# Patient Record
Sex: Female | Born: 1945
Health system: Southern US, Community
[De-identification: ages and names within clinical notes are randomized; demographics above are authoritative.]

## PROBLEM LIST (undated history)

## (undated) DIAGNOSIS — I10 Essential (primary) hypertension: Secondary | ICD-10-CM

## (undated) DIAGNOSIS — F32A Depression, unspecified: Secondary | ICD-10-CM

## (undated) DIAGNOSIS — I4819 Other persistent atrial fibrillation: Secondary | ICD-10-CM

## (undated) DIAGNOSIS — N189 Chronic kidney disease, unspecified: Secondary | ICD-10-CM

## (undated) DIAGNOSIS — F329 Major depressive disorder, single episode, unspecified: Secondary | ICD-10-CM

## (undated) DIAGNOSIS — C4491 Basal cell carcinoma of skin, unspecified: Secondary | ICD-10-CM

## (undated) DIAGNOSIS — K635 Polyp of colon: Secondary | ICD-10-CM

## (undated) DIAGNOSIS — E785 Hyperlipidemia, unspecified: Secondary | ICD-10-CM

## (undated) DIAGNOSIS — Z87898 Personal history of other specified conditions: Secondary | ICD-10-CM

## (undated) DIAGNOSIS — M1990 Unspecified osteoarthritis, unspecified site: Secondary | ICD-10-CM

## (undated) HISTORY — DX: Basal cell carcinoma of skin, unspecified: C44.91

## (undated) HISTORY — DX: Chronic kidney disease, unspecified: N18.9

## (undated) HISTORY — DX: Depression, unspecified: F32.A

## (undated) HISTORY — DX: Major depressive disorder, single episode, unspecified: F32.9

## (undated) HISTORY — DX: Other persistent atrial fibrillation: I48.19

## (undated) HISTORY — DX: Polyp of colon: K63.5

## (undated) HISTORY — DX: Essential (primary) hypertension: I10

## (undated) HISTORY — DX: Hyperlipidemia, unspecified: E78.5

## (undated) HISTORY — PX: CARDIAC CATHETERIZATION: SHX172

## (undated) HISTORY — PX: DIAGNOSTIC LAPAROSCOPY: SUR761

## (undated) HISTORY — PX: DILATION AND CURETTAGE OF UTERUS: SHX78

## (undated) HISTORY — PX: COLONOSCOPY: SHX174

## (undated) HISTORY — PX: CATARACT EXTRACTION, BILATERAL: SHX1313

---

## 1998-08-21 ENCOUNTER — Other Ambulatory Visit: Admission: RE | Admit: 1998-08-21 | Discharge: 1998-08-21 | Payer: Self-pay | Admitting: Obstetrics and Gynecology

## 1999-07-02 ENCOUNTER — Other Ambulatory Visit: Admission: RE | Admit: 1999-07-02 | Discharge: 1999-07-02 | Payer: Self-pay | Admitting: Obstetrics and Gynecology

## 2000-05-25 ENCOUNTER — Encounter: Payer: Self-pay | Admitting: Obstetrics and Gynecology

## 2000-05-25 ENCOUNTER — Encounter: Admission: RE | Admit: 2000-05-25 | Discharge: 2000-05-25 | Payer: Self-pay | Admitting: Obstetrics and Gynecology

## 2000-07-14 ENCOUNTER — Other Ambulatory Visit: Admission: RE | Admit: 2000-07-14 | Discharge: 2000-07-14 | Payer: Self-pay | Admitting: Obstetrics and Gynecology

## 2001-07-06 ENCOUNTER — Encounter: Admission: RE | Admit: 2001-07-06 | Discharge: 2001-07-06 | Payer: Self-pay | Admitting: Obstetrics and Gynecology

## 2001-07-06 ENCOUNTER — Encounter: Payer: Self-pay | Admitting: Obstetrics and Gynecology

## 2001-08-14 ENCOUNTER — Other Ambulatory Visit: Admission: RE | Admit: 2001-08-14 | Discharge: 2001-08-14 | Payer: Self-pay | Admitting: Obstetrics and Gynecology

## 2001-11-10 ENCOUNTER — Encounter (INDEPENDENT_AMBULATORY_CARE_PROVIDER_SITE_OTHER): Payer: Self-pay

## 2001-11-10 ENCOUNTER — Ambulatory Visit (HOSPITAL_COMMUNITY): Admission: RE | Admit: 2001-11-10 | Discharge: 2001-11-10 | Payer: Self-pay | Admitting: Obstetrics and Gynecology

## 2002-11-16 ENCOUNTER — Encounter: Payer: Self-pay | Admitting: Obstetrics and Gynecology

## 2002-11-16 ENCOUNTER — Encounter: Admission: RE | Admit: 2002-11-16 | Discharge: 2002-11-16 | Payer: Self-pay | Admitting: Obstetrics and Gynecology

## 2003-01-01 ENCOUNTER — Other Ambulatory Visit: Admission: RE | Admit: 2003-01-01 | Discharge: 2003-01-01 | Payer: Self-pay | Admitting: Obstetrics and Gynecology

## 2003-03-26 ENCOUNTER — Encounter: Payer: Self-pay | Admitting: Obstetrics and Gynecology

## 2003-03-26 ENCOUNTER — Encounter: Admission: RE | Admit: 2003-03-26 | Discharge: 2003-03-26 | Payer: Self-pay | Admitting: Obstetrics and Gynecology

## 2005-03-16 ENCOUNTER — Encounter: Admission: RE | Admit: 2005-03-16 | Discharge: 2005-03-16 | Payer: Self-pay | Admitting: Internal Medicine

## 2006-05-05 ENCOUNTER — Encounter: Admission: RE | Admit: 2006-05-05 | Discharge: 2006-05-05 | Payer: Self-pay | Admitting: Obstetrics and Gynecology

## 2007-01-25 ENCOUNTER — Ambulatory Visit (HOSPITAL_COMMUNITY): Admission: RE | Admit: 2007-01-25 | Discharge: 2007-01-25 | Payer: Self-pay | Admitting: Family Medicine

## 2007-01-25 ENCOUNTER — Ambulatory Visit: Payer: Self-pay | Admitting: Vascular Surgery

## 2007-01-25 ENCOUNTER — Encounter: Payer: Self-pay | Admitting: Vascular Surgery

## 2007-07-05 ENCOUNTER — Encounter: Admission: RE | Admit: 2007-07-05 | Discharge: 2007-07-05 | Payer: Self-pay | Admitting: Obstetrics and Gynecology

## 2008-07-17 ENCOUNTER — Encounter: Admission: RE | Admit: 2008-07-17 | Discharge: 2008-07-17 | Payer: Self-pay | Admitting: Obstetrics and Gynecology

## 2010-03-09 ENCOUNTER — Encounter: Admission: RE | Admit: 2010-03-09 | Discharge: 2010-03-09 | Payer: Self-pay | Admitting: Obstetrics and Gynecology

## 2010-08-06 ENCOUNTER — Ambulatory Visit: Payer: Self-pay | Admitting: Vascular Surgery

## 2011-02-19 NOTE — Procedures (Signed)
CAROTID DUPLEX EXAM   INDICATION:  Carotid artery disease   HISTORY:  Diabetes:  No  Cardiac:  No  Hypertension:  Yes  Smoking:  No  Previous Surgery:  No  CV History:  Amaurosis Fugax  Yes  Paresthesias Yes No, Hemiparesis Yes No                                       RIGHT             LEFT  Brachial systolic pressure:         160               162  Brachial Doppler waveforms:         WNL               WNL  Vertebral direction of flow:        Antegrade         Antegrade  DUPLEX VELOCITIES (cm/sec)  CCA peak systolic                   83                95  ECA peak systolic                   137               85  ICA peak systolic                   94                177  ICA end diastolic                   29                33  PLAQUE MORPHOLOGY:                  Heterogeneous     Heterogeneous  PLAQUE AMOUNT:                      Mild              Mild to moderate  PLAQUE LOCATION:                    Proximal ICA      Proximal ICA   IMPRESSION:  Bilateral intimal thickening within the common carotid  arteries.  Left-sided internal carotid artery stenosis is 40% to 59%.  Right-sided internal carotid artery is 1% to 39%.   ___________________________________________  Di Kindle. Edilia Bo, M.D.   OD/MEDQ  D:  08/10/2010  T:  08/10/2010  Job:  [id number]

## 2011-02-19 NOTE — Op Note (Signed)
[facility]  Patient:    Melissa Clements, Melissa Clements Visit Number: [id number] MRN: [id number]          Service Type: DSU Location: [facility] Attending Physician:  Maxie Better Dictated by:   Sheria Lang. Cherly Hensen, M.D. Proc. Date: 11/10/01 Admit Date:  11/10/2001                             Operative Report  PREOPERATIVE DIAGNOSES:       1. Postmenopausal bleeding.                               2. Endometrial mass.  POSTOPERATIVE DIAGNOSES:      1. Postmenopausal bleeding.                               2. Endometrial polyp.  OPERATION:                    Hysteroscopic removal of endometrial polyp,                               dilatation and curettage.  SURGEON:                      Sheronette A. Cherly Hensen, M.D.  ANESTHESIA:                   MAC and paracervical block.  INDICATIONS:                  This is a 65 year old postmenopausal female, not on hormone therapy with postmenopausal bleeding, who on evaluation was found to have an endometrial mass on sonohistogram.  The patient now presents for further evaluation and management.  She had a laminaria placed in the office on November 09, 2001, to facilitate the surgery as the patient has atrophic vaginitis, and has not been sexually active for quite some time.  Risks and benefits of the procedure had been explained to the patient, consent was signed, and the patient was transferred to the operating room.  DESCRIPTION OF PROCEDURE:     Under adequate monitored anesthesia, the patient was placed in the dorsolithotomy position.  She was sterilely prepped and draped in the usual fashion.  The bladder was catheterized for a moderate amount of urine.  A bivalve speculum was placed in the vagina.  The vagina was again prepped with some Betadine solution.  A single tooth tenaculum was placed on the anterior lip of the cervix and the laminaria was removed. Twenty cubic centimeters of 1% Nesacaine was injected  paracervically at 3 and 9 oclock.  The hysteroscope which was ________ was introduced into the uterine cavity without incident with the finding of a large polypoid mass arising from the left lateral wall.  The endometrium was otherwise erythematous diffusely, and both tubal ostia could be seen.  Using the single loupe, the endometrial polyp was resected in its entirety. Then, the cervical canal was inspected, and no lesions noted.  The resectoscope was removed.  The cavity was then curetted.  Scant tissue was obtained.  The procedure was terminated by removing all instruments from the vagina.  Specimen labeled endometrial curetting and endometrial polyp was sent to pathology.  Estimated blood loss was minimal.  Fluid  deficit was 90 cc. Complications none.  The patient tolerated the procedure well and was transferred to the recovery room in stable condition. Dictated by:   Sheria Lang. Cherly Hensen, M.D. Attending Physician:  Maxie Better DD:  11/10/01 TD:  11/11/01 Job: [id number]

## 2011-03-23 ENCOUNTER — Other Ambulatory Visit: Payer: Self-pay | Admitting: Obstetrics and Gynecology

## 2011-03-23 DIAGNOSIS — Z1231 Encounter for screening mammogram for malignant neoplasm of breast: Secondary | ICD-10-CM

## 2011-04-01 ENCOUNTER — Ambulatory Visit
Admission: RE | Admit: 2011-04-01 | Discharge: 2011-04-01 | Disposition: A | Discharge status: Home / Self Care | Payer: Medicare Other | Source: Ambulatory Visit | Attending: Obstetrics and Gynecology | Admitting: Obstetrics and Gynecology

## 2011-04-01 DIAGNOSIS — Z1231 Encounter for screening mammogram for malignant neoplasm of breast: Secondary | ICD-10-CM

## 2012-05-15 ENCOUNTER — Other Ambulatory Visit: Payer: Self-pay | Admitting: Obstetrics and Gynecology

## 2012-05-15 DIAGNOSIS — Z1231 Encounter for screening mammogram for malignant neoplasm of breast: Secondary | ICD-10-CM

## 2012-06-01 ENCOUNTER — Ambulatory Visit
Admission: RE | Admit: 2012-06-01 | Discharge: 2012-06-01 | Disposition: A | Discharge status: Home / Self Care | Payer: Medicare Other | Source: Ambulatory Visit | Attending: Obstetrics and Gynecology | Admitting: Obstetrics and Gynecology

## 2012-06-01 DIAGNOSIS — Z1231 Encounter for screening mammogram for malignant neoplasm of breast: Secondary | ICD-10-CM

## 2013-08-07 ENCOUNTER — Other Ambulatory Visit: Payer: Self-pay

## 2013-08-07 DIAGNOSIS — Z1231 Encounter for screening mammogram for malignant neoplasm of breast: Secondary | ICD-10-CM

## 2013-09-11 ENCOUNTER — Ambulatory Visit
Admission: RE | Admit: 2013-09-11 | Discharge: 2013-09-11 | Disposition: A | Discharge status: Home / Self Care | Payer: Medicare Other | Source: Ambulatory Visit

## 2013-09-11 DIAGNOSIS — Z1231 Encounter for screening mammogram for malignant neoplasm of breast: Secondary | ICD-10-CM

## 2014-10-08 ENCOUNTER — Other Ambulatory Visit: Payer: Self-pay

## 2014-10-08 DIAGNOSIS — Z1231 Encounter for screening mammogram for malignant neoplasm of breast: Secondary | ICD-10-CM

## 2014-10-16 ENCOUNTER — Ambulatory Visit
Admission: RE | Admit: 2014-10-16 | Discharge: 2014-10-16 | Disposition: A | Discharge status: Home / Self Care | Payer: Medicare Other | Source: Ambulatory Visit

## 2014-10-16 DIAGNOSIS — Z1231 Encounter for screening mammogram for malignant neoplasm of breast: Secondary | ICD-10-CM

## 2014-11-21 ENCOUNTER — Other Ambulatory Visit: Payer: Self-pay | Admitting: Family Medicine

## 2014-11-21 DIAGNOSIS — R42 Dizziness and giddiness: Secondary | ICD-10-CM

## 2014-11-21 DIAGNOSIS — R0789 Other chest pain: Secondary | ICD-10-CM

## 2014-11-28 ENCOUNTER — Ambulatory Visit
Admission: RE | Admit: 2014-11-28 | Discharge: 2014-11-28 | Disposition: A | Discharge status: Home / Self Care | Payer: Medicare Other | Source: Ambulatory Visit | Attending: Family Medicine | Admitting: Family Medicine

## 2014-11-28 ENCOUNTER — Other Ambulatory Visit: Payer: Self-pay | Admitting: Family Medicine

## 2014-11-28 DIAGNOSIS — M25552 Pain in left hip: Secondary | ICD-10-CM

## 2014-11-28 DIAGNOSIS — M545 Low back pain: Secondary | ICD-10-CM

## 2014-11-28 DIAGNOSIS — R0789 Other chest pain: Secondary | ICD-10-CM

## 2014-11-28 DIAGNOSIS — R42 Dizziness and giddiness: Secondary | ICD-10-CM

## 2015-01-10 ENCOUNTER — Other Ambulatory Visit: Payer: Self-pay | Admitting: Family Medicine

## 2015-01-10 ENCOUNTER — Ambulatory Visit
Admission: RE | Admit: 2015-01-10 | Discharge: 2015-01-10 | Disposition: A | Discharge status: Home / Self Care | Payer: Medicare Other | Source: Ambulatory Visit | Attending: Family Medicine | Admitting: Family Medicine

## 2015-01-10 DIAGNOSIS — M25531 Pain in right wrist: Secondary | ICD-10-CM

## 2015-01-10 DIAGNOSIS — R531 Weakness: Secondary | ICD-10-CM

## 2015-01-23 ENCOUNTER — Encounter: Payer: Self-pay | Admitting: Cardiology

## 2015-01-23 ENCOUNTER — Ambulatory Visit (INDEPENDENT_AMBULATORY_CARE_PROVIDER_SITE_OTHER): Payer: Medicare Other | Admitting: Cardiology

## 2015-01-23 VITALS — BP 110/70 | HR 57 | Ht 64.75 in | Wt 233.8 lb

## 2015-01-23 DIAGNOSIS — R42 Dizziness and giddiness: Secondary | ICD-10-CM

## 2015-01-23 DIAGNOSIS — R072 Precordial pain: Secondary | ICD-10-CM

## 2015-01-23 DIAGNOSIS — R002 Palpitations: Secondary | ICD-10-CM | POA: Insufficient documentation

## 2015-01-23 DIAGNOSIS — R079 Chest pain, unspecified: Secondary | ICD-10-CM | POA: Insufficient documentation

## 2015-01-23 DIAGNOSIS — K635 Polyp of colon: Secondary | ICD-10-CM | POA: Insufficient documentation

## 2015-01-23 DIAGNOSIS — I10 Essential (primary) hypertension: Secondary | ICD-10-CM | POA: Insufficient documentation

## 2015-01-23 DIAGNOSIS — C4491 Basal cell carcinoma of skin, unspecified: Secondary | ICD-10-CM | POA: Insufficient documentation

## 2015-01-23 HISTORY — DX: Precordial pain: R07.2

## 2015-01-23 HISTORY — DX: Palpitations: R00.2

## 2015-01-23 HISTORY — DX: Dizziness and giddiness: R42

## 2015-01-23 HISTORY — DX: Essential (primary) hypertension: I10

## 2015-01-23 NOTE — Progress Notes (Signed)
Cardiology Office Note   Date:  01/23/2015   ID:  Melissa Clements, DOB Feb 21, 1946, MRN [id number]  PCP:  [name], MD    Chief Complaint  Patient presents with  . Chest Pain  . Palpitations      History of Present Illness: Melissa Clements is a 69 y.o. female who presents for evaluation of chest pain.  The patient has a history of HTN, CKD, dyslipidemia and HTN.  She has never smoked.  She started having CP back in February associated with 2 episodes of  lightheadedness while up in the kitchen and occurred in the same week.    She would get so dizzy she would feel like she was going to pass out.  She did notice some palpitations at the same time.  After one of the episodes of dizziness she starting having burning  in her chest that did not radiate.  She took [place] which relieved the discomfort.  She has had other episodes of chest pressure.  She has a lot of family stress and has been feeling tired.  EKG done at PCP office was normal.  She has not had any further dizzy spells, palpitations or chest pain since February.    Past Medical History  Diagnosis Date  . Hypertension   . Chronic kidney disease     stage III  . Hyperlipidemia   . Depression   . Basal cell carcinoma   . Colon polyp     Past Surgical History  Procedure Laterality Date  . None       Current Outpatient Prescriptions  Medication Sig Dispense Refill  . amphetamine-dextroamphetamine (ADDERALL) 15 MG tablet Take 15 mg by mouth 2 (two) times daily.    Marland Kitchen aspirin 81 MG tablet Take 81 mg by mouth daily.    Marland Kitchen lisinopril (PRINIVIL,ZESTRIL) 20 MG tablet Take 20 mg by mouth daily.    . metoprolol succinate (TOPROL-XL) 50 MG 24 hr tablet Take 50 mg by mouth daily.    . simvastatin (ZOCOR) 40 MG tablet Take 40 mg by mouth daily.     No current facility-administered medications for this visit.    Allergies:   Review of patient's allergies indicates no known allergies.    Social History:  The patient   reports that she has never smoked. She does not have any smokeless tobacco history on file. She reports that she does not drink alcohol or use illicit drugs.   Family History:  The patient's family history includes Alzheimer's disease in her brother and mother; Diabetes in her mother; Heart attack (age of onset: 31) in her father; Heart disease in her father.    ROS:  Please see the history of present illness.   Otherwise, review of systems are positive for none.   All other systems are reviewed and negative.    PHYSICAL EXAM: VS:  BP 110/70 mmHg  Pulse 57  Ht 5' 4.75" (1.645 m)  Wt 233 lb 12.8 oz (106.051 kg)  BMI 39.19 kg/m2 , BMI Body mass index is 39.19 kg/(m^2). GEN: Well nourished, well developed, in no acute distress HEENT: normal Neck: no JVD, carotid bruits, or masses Cardiac: RRR; no murmurs, rubs, or gallops,no edema  Respiratory:  clear to auscultation bilaterally, normal work of breathing GI: soft, nontender, nondistended, + BS MS: no deformity or atrophy Skin: warm and dry, no rash Neuro:  Strength and sensation are intact Psych: euthymic mood, full affect   EKG:  EKG is ordered today.  The ekg ordered today demonstrates sinus bradycardia   Recent Labs: No results found for requested labs within last 365 days.    Lipid Panel No results found for: CHOL, TRIG, HDL, CHOLHDL, VLDL, LDLCALC, LDLDIRECT    Wt Readings from Last 3 Encounters:  01/23/15 233 lb 12.8 oz (106.051 kg)       ASSESSMENT AND PLAN:  1.  Atypical chest pain with no ischemia on EKG. Her CRF include HTN, dyslipidemia and family history of CAD at an early age.  I will get a Lexiscan myoview to assess for ischemia.   2.  HTN - well controlled 3.  Dyslipidemia 4.  Dizziness with palpitations.  Her EKG did not show any arrhythmias but I do hear frequent ectopy on exam.  I will get a Holter monitor to assess.   Current medicines are reviewed at length with the patient today.  The patient does  not have concerns regarding medicines.  The following changes have been made:  no change  Labs/ tests ordered today include: see above assessment and plan  Orders Placed This Encounter  Procedures  . Myocardial Perfusion Imaging  . EKG 12-Lead  . Holter monitor - 24 hour     Disposition:   FU with me PRN pending results of studies   Signed, Sueanne Margarita, MD  01/23/2015 4:14 PM    [facility] [address], [place], [place]  [address] Phone: [phone]; Fax: [phone]

## 2015-01-23 NOTE — Patient Instructions (Signed)
Medication Instructions:  None  Labwork: None  Testing/Procedures: Your physician has requested that you have a lexiscan myoview. For further information please visit [email or web address]. Please follow instruction sheet, as given.  Your physician has recommended that you wear a 24 holter monitor. Holter monitors are medical devices that record the heart's electrical activity. Doctors most often use these monitors to diagnose arrhythmias. Arrhythmias are problems with the speed or rhythm of the heartbeat. The monitor is a small, portable device. You can wear one while you do your normal daily activities. This is usually used to diagnose what is causing palpitations/syncope (passing out).   Follow-Up: Your physician recommends that you schedule a follow-up appointment as needed with Dr. Radford Pax.  Any Other Special Instructions Will Be Listed Below (If Applicable).

## 2015-02-03 ENCOUNTER — Encounter: Payer: Self-pay | Admitting: *Deleted

## 2015-02-03 ENCOUNTER — Encounter (INDEPENDENT_AMBULATORY_CARE_PROVIDER_SITE_OTHER): Payer: Medicare Other

## 2015-02-03 DIAGNOSIS — R42 Dizziness and giddiness: Secondary | ICD-10-CM

## 2015-02-03 DIAGNOSIS — R002 Palpitations: Secondary | ICD-10-CM

## 2015-02-03 NOTE — Progress Notes (Signed)
Patient ID: Melissa Clements, female   DOB: 04-22-1946, 69 y.o.   MRN: [id number] [facility] 24 hour holter monitor applied to patient.

## 2015-02-05 ENCOUNTER — Telehealth (HOSPITAL_COMMUNITY): Payer: Self-pay

## 2015-02-05 ENCOUNTER — Telehealth (HOSPITAL_COMMUNITY): Payer: Self-pay | Admitting: Radiology

## 2015-02-05 NOTE — Telephone Encounter (Signed)
Patient given detailed instructions per Myocardial Perfusion Study Information Sheet for test on 02-06-2015 at 7:45am. Patient verbalized understanding per Earl Many, CNMT/[place] Oletta Lamas, RN.

## 2015-02-05 NOTE — Telephone Encounter (Signed)
Left message on voicemail in reference to upcoming appointment scheduled for 02-06-2015. Phone number given for a call back so details instructions can be given. Oletta Lamas, Orren Pietsch A

## 2015-02-06 ENCOUNTER — Ambulatory Visit (HOSPITAL_COMMUNITY): Payer: Medicare Other | Attending: Internal Medicine

## 2015-02-06 ENCOUNTER — Telehealth: Payer: Self-pay

## 2015-02-06 DIAGNOSIS — R002 Palpitations: Secondary | ICD-10-CM | POA: Diagnosis not present

## 2015-02-06 DIAGNOSIS — I10 Essential (primary) hypertension: Secondary | ICD-10-CM | POA: Insufficient documentation

## 2015-02-06 DIAGNOSIS — R42 Dizziness and giddiness: Secondary | ICD-10-CM | POA: Insufficient documentation

## 2015-02-06 DIAGNOSIS — R079 Chest pain, unspecified: Secondary | ICD-10-CM | POA: Diagnosis present

## 2015-02-06 LAB — MYOCARDIAL PERFUSION IMAGING
CHL CUP NUCLEAR SDS: 1
CHL CUP NUCLEAR SRS: 9
CHL CUP NUCLEAR SSS: 10
CHL CUP RESTING HR STRESS: 59 {beats}/min
CSEPPHR: 74 {beats}/min
LHR: 0.25
LV sys vol: 15 mL
LVDIAVOL: 68 mL
NUC STRESS EF: 78 %
NUC STRESS TID: 0.85

## 2015-02-06 MED ORDER — REGADENOSON 0.4 MG/5ML IV SOLN
0.4000 mg | Freq: Once | INTRAVENOUS | Status: AC
  Administered 2015-02-06: 0.4 mg via INTRAVENOUS

## 2015-02-06 MED ORDER — TECHNETIUM TC 99M SESTAMIBI GENERIC - CARDIOLITE
11.0000 | Freq: Once | INTRAVENOUS | Status: AC | PRN
  Administered 2015-02-06: 11 via INTRAVENOUS

## 2015-02-06 MED ORDER — TECHNETIUM TC 99M SESTAMIBI GENERIC - CARDIOLITE
33.0000 | Freq: Once | INTRAVENOUS | Status: AC | PRN
  Administered 2015-02-06: 33 via INTRAVENOUS

## 2015-02-06 NOTE — Telephone Encounter (Signed)
Informed patient of results and verbal understanding expressed.  

## 2015-02-06 NOTE — Telephone Encounter (Signed)
-----   Message from Sueanne Margarita, MD sent at 02/05/2015  9:18 PM EDT ----- Please let patient know that heart monitor showed NSR with occasional extra heart beats from the top and bottom of heart which are benign

## 2016-04-30 IMAGING — NM NM MYOCAR MULTI W/ SPECT
3 series · 18 of 18 positions shown · non-contrast
Comparison: none

[Series 1: rest · 6.51mm/px · 6 of 64 frames shown]
[frame 6/64]
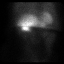
[frame 16/64]
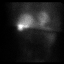
[frame 27/64]
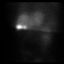
[frame 38/64]
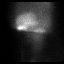
[frame 48/64]
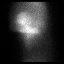
[frame 59/64]
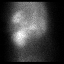

[Series 2: stress · 6.51mm/px · 6 of 63 frames shown (1 of 2)]
[frame 6/63]
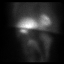
[frame 16/63]
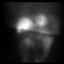
[frame 27/63]
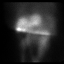
[frame 37/63]
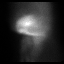
[frame 48/63]
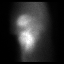
[frame 58/63]
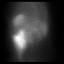

[Series 2: stress · 6.51mm/px · 6 of 512 frames shown (2 of 2)]
[frame 43/512]
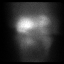
[frame 128/512]
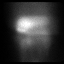
[frame 214/512]
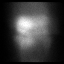
[frame 299/512]
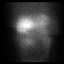
[frame 384/512]
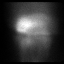
[frame 470/512]
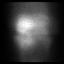

[18 of 18 positions shown; findings below may reference images not displayed]

Canned report from images found in remote index.

Refer to host system for actual result text.

## 2016-12-14 DIAGNOSIS — E559 Vitamin D deficiency, unspecified: Secondary | ICD-10-CM | POA: Diagnosis not present

## 2016-12-14 DIAGNOSIS — I10 Essential (primary) hypertension: Secondary | ICD-10-CM | POA: Diagnosis not present

## 2016-12-14 DIAGNOSIS — N183 Chronic kidney disease, stage 3 (moderate): Secondary | ICD-10-CM | POA: Diagnosis not present

## 2016-12-14 DIAGNOSIS — E782 Mixed hyperlipidemia: Secondary | ICD-10-CM | POA: Diagnosis not present

## 2016-12-14 DIAGNOSIS — F3341 Major depressive disorder, recurrent, in partial remission: Secondary | ICD-10-CM | POA: Diagnosis not present

## 2016-12-20 DIAGNOSIS — I10 Essential (primary) hypertension: Secondary | ICD-10-CM | POA: Diagnosis not present

## 2016-12-20 DIAGNOSIS — E559 Vitamin D deficiency, unspecified: Secondary | ICD-10-CM | POA: Diagnosis not present

## 2016-12-20 DIAGNOSIS — M85852 Other specified disorders of bone density and structure, left thigh: Secondary | ICD-10-CM | POA: Diagnosis not present

## 2016-12-20 DIAGNOSIS — E669 Obesity, unspecified: Secondary | ICD-10-CM | POA: Diagnosis not present

## 2016-12-20 DIAGNOSIS — Z0000 Encounter for general adult medical examination without abnormal findings: Secondary | ICD-10-CM | POA: Diagnosis not present

## 2016-12-20 DIAGNOSIS — F3341 Major depressive disorder, recurrent, in partial remission: Secondary | ICD-10-CM | POA: Diagnosis not present

## 2016-12-20 DIAGNOSIS — N183 Chronic kidney disease, stage 3 (moderate): Secondary | ICD-10-CM | POA: Diagnosis not present

## 2016-12-20 DIAGNOSIS — Z6839 Body mass index (BMI) 39.0-39.9, adult: Secondary | ICD-10-CM | POA: Diagnosis not present

## 2016-12-20 DIAGNOSIS — E782 Mixed hyperlipidemia: Secondary | ICD-10-CM | POA: Diagnosis not present

## 2017-01-11 ENCOUNTER — Other Ambulatory Visit: Payer: Self-pay | Admitting: Obstetrics and Gynecology

## 2017-01-11 DIAGNOSIS — Z1231 Encounter for screening mammogram for malignant neoplasm of breast: Secondary | ICD-10-CM

## 2017-01-18 DIAGNOSIS — H43812 Vitreous degeneration, left eye: Secondary | ICD-10-CM | POA: Diagnosis not present

## 2017-01-18 DIAGNOSIS — H43393 Other vitreous opacities, bilateral: Secondary | ICD-10-CM | POA: Diagnosis not present

## 2017-01-27 DIAGNOSIS — M8588 Other specified disorders of bone density and structure, other site: Secondary | ICD-10-CM | POA: Diagnosis not present

## 2017-01-31 ENCOUNTER — Ambulatory Visit
Admission: RE | Admit: 2017-01-31 | Discharge: 2017-01-31 | Disposition: A | Discharge status: Home / Self Care | Payer: PPO | Source: Ambulatory Visit | Attending: Obstetrics and Gynecology | Admitting: Obstetrics and Gynecology

## 2017-01-31 DIAGNOSIS — Z1231 Encounter for screening mammogram for malignant neoplasm of breast: Secondary | ICD-10-CM

## 2017-02-15 DIAGNOSIS — Z1211 Encounter for screening for malignant neoplasm of colon: Secondary | ICD-10-CM | POA: Diagnosis not present

## 2017-02-16 DIAGNOSIS — H43393 Other vitreous opacities, bilateral: Secondary | ICD-10-CM | POA: Diagnosis not present

## 2017-02-16 DIAGNOSIS — H43812 Vitreous degeneration, left eye: Secondary | ICD-10-CM | POA: Diagnosis not present

## 2017-02-17 DIAGNOSIS — M1611 Unilateral primary osteoarthritis, right hip: Secondary | ICD-10-CM | POA: Diagnosis not present

## 2017-02-17 DIAGNOSIS — R87610 Atypical squamous cells of undetermined significance on cytologic smear of cervix (ASC-US): Secondary | ICD-10-CM | POA: Diagnosis not present

## 2017-02-17 DIAGNOSIS — Z01419 Encounter for gynecological examination (general) (routine) without abnormal findings: Secondary | ICD-10-CM | POA: Diagnosis not present

## 2017-03-03 DIAGNOSIS — M1611 Unilateral primary osteoarthritis, right hip: Secondary | ICD-10-CM | POA: Diagnosis not present

## 2017-03-10 DIAGNOSIS — H43812 Vitreous degeneration, left eye: Secondary | ICD-10-CM | POA: Diagnosis not present

## 2017-03-10 DIAGNOSIS — H40013 Open angle with borderline findings, low risk, bilateral: Secondary | ICD-10-CM | POA: Diagnosis not present

## 2017-03-10 DIAGNOSIS — H43393 Other vitreous opacities, bilateral: Secondary | ICD-10-CM | POA: Diagnosis not present

## 2017-03-10 DIAGNOSIS — H524 Presbyopia: Secondary | ICD-10-CM | POA: Diagnosis not present

## 2017-03-10 DIAGNOSIS — H5213 Myopia, bilateral: Secondary | ICD-10-CM | POA: Diagnosis not present

## 2017-03-10 DIAGNOSIS — Z961 Presence of intraocular lens: Secondary | ICD-10-CM | POA: Diagnosis not present

## 2017-03-31 DIAGNOSIS — M25551 Pain in right hip: Secondary | ICD-10-CM | POA: Diagnosis not present

## 2017-03-31 DIAGNOSIS — M1611 Unilateral primary osteoarthritis, right hip: Secondary | ICD-10-CM | POA: Diagnosis not present

## 2017-06-21 DIAGNOSIS — E669 Obesity, unspecified: Secondary | ICD-10-CM | POA: Diagnosis not present

## 2017-06-21 DIAGNOSIS — N3946 Mixed incontinence: Secondary | ICD-10-CM | POA: Diagnosis not present

## 2017-06-21 DIAGNOSIS — F3341 Major depressive disorder, recurrent, in partial remission: Secondary | ICD-10-CM | POA: Diagnosis not present

## 2017-06-21 DIAGNOSIS — Z6837 Body mass index (BMI) 37.0-37.9, adult: Secondary | ICD-10-CM | POA: Diagnosis not present

## 2017-06-21 DIAGNOSIS — E782 Mixed hyperlipidemia: Secondary | ICD-10-CM | POA: Diagnosis not present

## 2017-06-21 DIAGNOSIS — E559 Vitamin D deficiency, unspecified: Secondary | ICD-10-CM | POA: Diagnosis not present

## 2017-06-21 DIAGNOSIS — F900 Attention-deficit hyperactivity disorder, predominantly inattentive type: Secondary | ICD-10-CM | POA: Diagnosis not present

## 2017-06-21 DIAGNOSIS — N183 Chronic kidney disease, stage 3 (moderate): Secondary | ICD-10-CM | POA: Diagnosis not present

## 2017-06-21 DIAGNOSIS — I10 Essential (primary) hypertension: Secondary | ICD-10-CM | POA: Diagnosis not present

## 2017-06-21 DIAGNOSIS — L259 Unspecified contact dermatitis, unspecified cause: Secondary | ICD-10-CM | POA: Diagnosis not present

## 2018-01-03 DIAGNOSIS — M25551 Pain in right hip: Secondary | ICD-10-CM | POA: Insufficient documentation

## 2018-01-11 NOTE — H&P (Signed)
TOTAL HIP ADMISSION H&P  Patient is admitted for right total hip arthroplasty.  Subjective:  Chief Complaint:   Right hip primary OA / pain  HPI: Melissa Clements, 72 y.o. female, has a history of pain and functional disability in the right hip(s) due to arthritis and patient has failed non-surgical conservative treatments for greater than 12 weeks to include NSAID's and/or analgesics, corticosteriod injections, use of assistive devices and activity modification.  Onset of symptoms was gradual starting ~4 years ago with gradually worsening course since that time.The patient noted no past surgery on the right hip(s).  Patient currently rates pain in the right hip at 7 out of 10 with activity. Patient has worsening of pain with activity and weight bearing, trendelenberg gait, pain that interfers with activities of daily living and pain with passive range of motion. Patient has evidence of periarticular osteophytes and joint space narrowing by imaging studies. This condition presents safety issues increasing the risk of falls.  There is no current active infection.  Risks, benefits and expectations were discussed with the patient.  Risks including but not limited to the risk of anesthesia, blood clots, nerve damage, blood vessel damage, failure of the prosthesis, infection and up to and including death.  Patient understand the risks, benefits and expectations and wishes to proceed with surgery.   PCP: [name], MD  D/C Plans:       Home  Post-op Meds:       No Rx given   Tranexamic Acid:      To be given - IV   Decadron:      Is to be given  FYI:      ASA  Norco  DME:   Pt already has equipment  PT:   No PT    Patient Active Problem List   Diagnosis Date Noted  . Chest pain 01/23/2015  . Heart palpitations 01/23/2015  . Benign essential HTN 01/23/2015  . Dizziness 01/23/2015  . Basal cell carcinoma   . Colon polyp    Past Medical History:  Diagnosis Date  . Basal cell  carcinoma   . Chronic kidney disease    stage III  . Colon polyp   . Depression   . Hyperlipidemia   . Hypertension     Past Surgical History:  Procedure Laterality Date  . none      No current facility-administered medications for this encounter.    Current Outpatient Medications  Medication Sig Dispense Refill Last Dose  . amphetamine-dextroamphetamine (ADDERALL) 15 MG tablet Take 15 mg by mouth 2 (two) times daily.   Taking  . aspirin 81 MG tablet Take 81 mg by mouth daily.   Taking  . lisinopril (PRINIVIL,ZESTRIL) 20 MG tablet Take 20 mg by mouth daily.   Taking  . metoprolol succinate (TOPROL-XL) 50 MG 24 hr tablet Take 50 mg by mouth daily.   Taking  . simvastatin (ZOCOR) 40 MG tablet Take 40 mg by mouth daily.   Taking   No Known Allergies   Social History   Tobacco Use  . Smoking status: Never Smoker  Substance Use Topics  . Alcohol use: No    Alcohol/week: 0.0 oz    Family History  Problem Relation Age of Onset  . Heart attack Father 8  . Heart disease Father   . Diabetes Mother   . Alzheimer's disease Mother   . Alzheimer's disease Brother      Review of Systems  Constitutional: Negative.   HENT:  Negative.   Eyes: Negative.   Respiratory: Negative.   Cardiovascular: Negative.   Gastrointestinal: Negative.   Genitourinary: Positive for frequency (stress incontinence).  Musculoskeletal: Positive for joint pain.  Skin: Negative.   Neurological: Negative.   Endo/Heme/Allergies: Positive for environmental allergies.  Psychiatric/Behavioral: Negative.     Objective:  Physical Exam  Constitutional: She is oriented to person, place, and time. She appears well-developed.  HENT:  Head: Normocephalic.  Eyes: Pupils are equal, round, and reactive to light.  Neck: Neck supple. No JVD present. No tracheal deviation present. No thyromegaly present.  Cardiovascular: Normal rate, regular rhythm and intact distal pulses.  Respiratory: Effort normal and breath  sounds normal. No respiratory distress. She has no wheezes.  GI: Soft. There is no tenderness. There is no guarding.  Musculoskeletal:       Right hip: She exhibits decreased range of motion, decreased strength, tenderness and bony tenderness. She exhibits no swelling, no deformity and no laceration.  Lymphadenopathy:    She has no cervical adenopathy.  Neurological: She is alert and oriented to person, place, and time.  Skin: Skin is warm and dry.  Psychiatric: She has a normal mood and affect.      Labs:  Estimated body mass index is 38.57 kg/m as calculated from the following:   Height as of 02/06/15: 5' 4.75" (1.645 m).   Weight as of 02/06/15: 104.3 kg (230 lb).   Imaging Review Plain radiographs demonstrate severe degenerative joint disease of the right hip(s). The bone quality appears to be good for age and reported activity level.    Preoperative templating of the joint replacement has been completed, documented, and submitted to the Operating Room personnel in order to optimize intra-operative equipment management.    Patient's anticipated LOS is less than 2 midnights, meeting these requirements: - Lives within 1 hour of care - Has a competent adult at home to recover with post-op recover - NO history of  - Chronic pain requiring opiods  - Diabetes  - Coronary Artery Disease  - Heart failure  - Heart attack  - Stroke  - DVT/VTE  - Cardiac arrhythmia  - Respiratory Failure/COPD  - Renal failure  - Anemia  - Advanced Liver disease        Assessment/Plan:  End stage arthritis, right hip  The patient history, physical examination, clinical judgement of the provider and imaging studies are consistent with end stage degenerative joint disease of the right hip and total hip arthroplasty is deemed medically necessary. The treatment options including medical management, injection therapy, arthroscopy and arthroplasty were discussed at length. The risks and  benefits of total hip arthroplasty were presented and reviewed. The risks due to aseptic loosening, infection, stiffness, dislocation/subluxation,  thromboembolic complications and other imponderables were discussed.  The patient acknowledged the explanation, agreed to proceed with the plan and consent was signed. Patient is being admitted for inpatient treatment for surgery, pain control, PT, OT, prophylactic antibiotics, VTE prophylaxis, progressive ambulation and ADL's and discharge planning.The patient is planning to be discharged home.    West Pugh Kidus Delman   PA-C  01/11/2018, 10:09 AM

## 2018-01-17 ENCOUNTER — Encounter (HOSPITAL_COMMUNITY): Payer: Self-pay

## 2018-01-17 NOTE — Patient Instructions (Addendum)
Your procedure is scheduled on: Tuesday, January 24, 2018   Surgery Time:  10:05AM -11:15AM   Report to [place]  Entrance    Report to admitting at 07:30 AM   Call this number if you have problems the morning of surgery [phone]   Do not eat food or drink:After Midnight.   Do NOT smoke after Midnight   Take these medicines the morning of surgery with A SIP OF WATER: Amlodipine, Metoprolol, Simvastatin                               You may not have any metal on your body including hair pins, jewelry, and body piercings             Do not wear make-up, lotions, powders, perfumes/cologne, or deodorant             Do not wear nail polish.  Do not shave  48 hours prior to surgery.                Do not bring valuables to the hospital. [place].   Contacts, dentures or bridgework may not be worn into surgery.   Leave suitcase in the car. After surgery it may be brought to your room.   Special Instructions: Bring a copy of your healthcare power of attorney and living will documents         the day of surgery if you haven't scanned them in before.              Please read over the following fact sheets you were given:  [facility] - Preparing for Surgery Before surgery, you can play an important role.  Because skin is not sterile, your skin needs to be as free of germs as possible.  You can reduce the number of germs on your skin by washing with CHG (chlorahexidine gluconate) soap before surgery.  CHG is an antiseptic cleaner which kills germs and bonds with the skin to continue killing germs even after washing. Please DO NOT use if you have an allergy to CHG or antibacterial soaps.  If your skin becomes reddened/irritated stop using the CHG and inform your nurse when you arrive at Short Stay. Do not shave (including legs and underarms) for at least 48 hours prior to the first CHG shower.  You may shave your  face/neck.  Please follow these instructions carefully:  1.  Shower with CHG Soap the night before surgery and the  morning of surgery.  2.  If you choose to wash your hair, wash your hair first as usual with your normal  shampoo.  3.  After you shampoo, rinse your hair and body thoroughly to remove the shampoo.                             4.  Use CHG as you would any other liquid soap.  You can apply chg directly to the skin and wash.  Gently with a scrungie or clean washcloth.  5.  Apply the CHG Soap to your body ONLY FROM THE NECK DOWN.   Do   not use on face/ open  Wound or open sores. Avoid contact with eyes, ears mouth and   genitals (private parts).                       Wash face,  Genitals (private parts) with your normal soap.             6.  Wash thoroughly, paying special attention to the area where your    surgery  will be performed.  7.  Thoroughly rinse your body with warm water from the neck down.  8.  DO NOT shower/wash with your normal soap after using and rinsing off the CHG Soap.                9.  Pat yourself dry with a clean towel.            10.  Wear clean pajamas.            11.  Place clean sheets on your bed the night of your first shower and do not  sleep with pets. Day of Surgery : Do not apply any lotions/deodorants the morning of surgery.  Please wear clean clothes to the hospital/surgery center.  FAILURE TO FOLLOW THESE INSTRUCTIONS MAY RESULT IN THE CANCELLATION OF YOUR SURGERY  PATIENT SIGNATURE_________________________________  NURSE SIGNATURE__________________________________  ________________________________________________________________________   Melissa Clements  An incentive spirometer is a tool that can help keep your lungs clear and active. This tool measures how well you are filling your lungs with each breath. Taking long deep breaths may help reverse or decrease the chance of developing breathing (pulmonary)  problems (especially infection) following:  A long period of time when you are unable to move or be active. BEFORE THE PROCEDURE   If the spirometer includes an indicator to show your best effort, your nurse or respiratory therapist will set it to a desired goal.  If possible, sit up straight or lean slightly forward. Try not to slouch.  Hold the incentive spirometer in an upright position. INSTRUCTIONS FOR USE  1. Sit on the edge of your bed if possible, or sit up as far as you can in bed or on a chair. 2. Hold the incentive spirometer in an upright position. 3. Breathe out normally. 4. Place the mouthpiece in your mouth and seal your lips tightly around it. 5. Breathe in slowly and as deeply as possible, raising the piston or the ball toward the top of the column. 6. Hold your breath for 3-5 seconds or for as long as possible. Allow the piston or ball to fall to the bottom of the column. 7. Remove the mouthpiece from your mouth and breathe out normally. 8. Rest for a few seconds and repeat Steps 1 through 7 at least 10 times every 1-2 hours when you are awake. Take your time and take a few normal breaths between deep breaths. 9. The spirometer may include an indicator to show your best effort. Use the indicator as a goal to work toward during each repetition. 10. After each set of 10 deep breaths, practice coughing to be sure your lungs are clear. If you have an incision (the cut made at the time of surgery), support your incision when coughing by placing a pillow or rolled up towels firmly against it. Once you are able to get out of bed, walk around indoors and cough well. You may stop using the incentive spirometer when instructed by your caregiver.  RISKS AND COMPLICATIONS  Take your time  so you do not get dizzy or light-headed.  If you are in pain, you may need to take or ask for pain medication before doing incentive spirometry. It is harder to take a deep breath if you are having  pain. AFTER USE  Rest and breathe slowly and easily.  It can be helpful to keep track of a log of your progress. Your caregiver can provide you with a simple table to help with this. If you are using the spirometer at home, follow these instructions: [place] IF:   You are having difficultly using the spirometer.  You have trouble using the spirometer as often as instructed.  Your pain medication is not giving enough relief while using the spirometer.  You develop fever of 100.5 F (38.1 C) or higher. SEEK IMMEDIATE MEDICAL CARE IF:   You cough up bloody sputum that had not been present before.  You develop fever of 102 F (38.9 C) or greater.  You develop worsening pain at or near the incision site. MAKE SURE YOU:   Understand these instructions.  Will watch your condition.  Will get help right away if you are not doing well or get worse. Document Released: 01/31/2007 Document Revised: 12/13/2011 Document Reviewed: 04/03/2007 ExitCare Patient Information 2014 Exit[employer], [place].   ________________________________________________________________________  WHAT IS A BLOOD TRANSFUSION? Blood Transfusion Information  A transfusion is the replacement of blood or some of its parts. Blood is made up of multiple cells which provide different functions.  Red blood cells carry oxygen and are used for blood loss replacement.  White blood cells fight against infection.  Platelets control bleeding.  Plasma helps clot blood.  Other blood products are available for specialized needs, such as hemophilia or other clotting disorders. BEFORE THE TRANSFUSION  Who gives blood for transfusions?   Healthy volunteers who are fully evaluated to make sure their blood is safe. This is blood bank blood. Transfusion therapy is the safest it has ever been in the practice of medicine. Before blood is taken from a donor, a complete history is taken to make sure that person has no history  of diseases nor engages in risky social behavior (examples are intravenous drug use or sexual activity with multiple partners). The donor's travel history is screened to minimize risk of transmitting infections, such as malaria. The donated blood is tested for signs of infectious diseases, such as HIV and hepatitis. The blood is then tested to be sure it is compatible with you in order to minimize the chance of a transfusion reaction. If you or a relative donates blood, this is often done in anticipation of surgery and is not appropriate for emergency situations. It takes many days to process the donated blood. RISKS AND COMPLICATIONS Although transfusion therapy is very safe and saves many lives, the main dangers of transfusion include:   Getting an infectious disease.  Developing a transfusion reaction. This is an allergic reaction to something in the blood you were given. Every precaution is taken to prevent this. The decision to have a blood transfusion has been considered carefully by your caregiver before blood is given. Blood is not given unless the benefits outweigh the risks. AFTER THE TRANSFUSION  Right after receiving a blood transfusion, you will usually feel much better and more energetic. This is especially true if your red blood cells have gotten low (anemic). The transfusion raises the level of the red blood cells which carry oxygen, and this usually causes an energy increase.  The  nurse administering the transfusion will monitor you carefully for complications. HOME CARE INSTRUCTIONS  No special instructions are needed after a transfusion. You may find your energy is better. Speak with your caregiver about any limitations on activity for underlying diseases you may have. SEEK MEDICAL CARE IF:   Your condition is not improving after your transfusion.  You develop redness or irritation at the intravenous (IV) site. SEEK IMMEDIATE MEDICAL CARE IF:  Any of the following symptoms  occur over the next 12 hours:  Shaking chills.  You have a temperature by mouth above 102 F (38.9 C), not controlled by medicine.  Chest, back, or muscle pain.  People around you feel you are not acting correctly or are confused.  Shortness of breath or difficulty breathing.  Dizziness and fainting.  You get a rash or develop hives.  You have a decrease in urine output.  Your urine turns a dark color or changes to pink, red, or brown. Any of the following symptoms occur over the next 10 days:  You have a temperature by mouth above 102 F (38.9 C), not controlled by medicine.  Shortness of breath.  Weakness after normal activity.  The white part of the eye turns yellow (jaundice).  You have a decrease in the amount of urine or are urinating less often.  Your urine turns a dark color or changes to pink, red, or brown. Document Released: 09/17/2000 Document Revised: 12/13/2011 Document Reviewed: 05/06/2008 [facility] Patient Information 2014 [place], [place].  _______________________________________________________________________

## 2018-01-19 ENCOUNTER — Encounter (HOSPITAL_COMMUNITY)
Admission: RE | Admit: 2018-01-19 | Discharge: 2018-01-19 | Disposition: A | Discharge status: Home / Self Care | Payer: Medicare Other | Source: Ambulatory Visit | Attending: Orthopedic Surgery | Admitting: Orthopedic Surgery

## 2018-01-19 ENCOUNTER — Encounter (HOSPITAL_COMMUNITY): Payer: Self-pay

## 2018-01-19 ENCOUNTER — Other Ambulatory Visit: Payer: Self-pay

## 2018-01-19 DIAGNOSIS — Z01812 Encounter for preprocedural laboratory examination: Secondary | ICD-10-CM | POA: Insufficient documentation

## 2018-01-19 DIAGNOSIS — M1611 Unilateral primary osteoarthritis, right hip: Secondary | ICD-10-CM | POA: Diagnosis not present

## 2018-01-19 DIAGNOSIS — Z01810 Encounter for preprocedural cardiovascular examination: Secondary | ICD-10-CM | POA: Insufficient documentation

## 2018-01-19 HISTORY — DX: Personal history of other specified conditions: Z87.898

## 2018-01-19 HISTORY — DX: Unspecified osteoarthritis, unspecified site: M19.90

## 2018-01-19 LAB — CBC
HEMATOCRIT: 39.1 % (ref 36.0–46.0)
HEMOGLOBIN: 12.9 g/dL (ref 12.0–15.0)
MCH: 30.6 pg (ref 26.0–34.0)
MCHC: 33 g/dL (ref 30.0–36.0)
MCV: 92.7 fL (ref 78.0–100.0)
Platelets: 170 10*3/uL (ref 150–400)
RBC: 4.22 MIL/uL (ref 3.87–5.11)
RDW: 13.5 % (ref 11.5–15.5)
WBC: 6.9 10*3/uL (ref 4.0–10.5)

## 2018-01-19 LAB — SURGICAL PCR SCREEN
MRSA, PCR: NEGATIVE
STAPHYLOCOCCUS AUREUS: POSITIVE — AB

## 2018-01-19 LAB — BASIC METABOLIC PANEL
ANION GAP: 9 (ref 5–15)
BUN: 39 mg/dL — AB (ref 6–20)
CHLORIDE: 108 mmol/L (ref 101–111)
CO2: 23 mmol/L (ref 22–32)
Calcium: 9.7 mg/dL (ref 8.9–10.3)
Creatinine, Ser: 1.21 mg/dL — ABNORMAL HIGH (ref 0.44–1.00)
GFR calc Af Amer: 51 mL/min — ABNORMAL LOW (ref >60)
GFR calc non Af Amer: 44 mL/min — ABNORMAL LOW (ref >60)
GLUCOSE: 95 mg/dL (ref 65–99)
POTASSIUM: 5 mmol/L (ref 3.5–5.1)
SODIUM: 140 mmol/L (ref 135–145)

## 2018-01-19 LAB — ABO/RH: ABO/RH(D): AB NEG

## 2018-01-19 NOTE — Progress Notes (Signed)
01-19-18 BMP result routed to Dr. Alvan Dame for review.

## 2018-01-19 NOTE — Pre-Procedure Instructions (Signed)
Dr. Fransisco Beau made aware of BUN and Creatinine results 01/19/2018 ordered repeat BMP day of surgery.  Order placed in epic.

## 2018-01-20 ENCOUNTER — Encounter: Payer: Self-pay | Admitting: Cardiology

## 2018-01-20 ENCOUNTER — Ambulatory Visit (INDEPENDENT_AMBULATORY_CARE_PROVIDER_SITE_OTHER): Payer: Medicare Other | Admitting: Cardiology

## 2018-01-20 VITALS — BP 120/68 | HR 68 | Ht 63.5 in | Wt 217.1 lb

## 2018-01-20 DIAGNOSIS — R001 Bradycardia, unspecified: Secondary | ICD-10-CM | POA: Diagnosis not present

## 2018-01-20 DIAGNOSIS — I10 Essential (primary) hypertension: Secondary | ICD-10-CM

## 2018-01-20 DIAGNOSIS — R002 Palpitations: Secondary | ICD-10-CM | POA: Diagnosis not present

## 2018-01-20 HISTORY — DX: Bradycardia, unspecified: R00.1

## 2018-01-20 NOTE — Patient Instructions (Signed)
Medication Instructions:  Your physician recommends that you continue on your current medications as directed. Please refer to the Current Medication list given to you today.  Labwork: None ordered  Testing/Procedures: None ordered  Follow-Up: Your physician recommends that you schedule a follow-up appointment in: 2 month follow up   Any Other Special Instructions Will Be Listed Below (If Applicable).     If you need a refill on your cardiac medications before your next appointment, please call your pharmacy.

## 2018-01-20 NOTE — Progress Notes (Signed)
Cardiology Consultation:    Date:  01/20/2018   ID:  Melissa Clements, DOB [birth year], MRN [id number]  PCP:  [name], MD  Cardiologist:  Jenne Campus, MD   Referring MD: [name], MD   Chief Complaint  Patient presents with  . Pre-op Exam  I need to have hip surgery  History of Present Illness:    Melissa Clements is a 72 y.o. female who is being seen today for the evaluation of bradycardia at the request of [name], MD.  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  She is scheduled next Tuesday she went to her primary care physician EKG was done there was some findings that were worrisome and she was referred to [place].  Overall she seems to be doing well in spite of her chronic hip pain she is still able to do quite well she got one flight of stairs at home she can climb it with no difficulties.  She is also doing line dancing at [facility] [employer] does have some pain in her hip but overall she is able to do it.  She can go to store and shop with no chest pain tightness squeezing pressure burning chest. In 2016 she was evaluated for some atypical chest pain and dizziness.  Stress test was done which was negative.  Holter monitor was placed and nothing critical was identified. This time she went to her primary care physician she was noted to be bradycardic and dose of metoprolol has been cut down to only 12.5 mg long-acting metoprolol she denies having any symptoms again no dizziness no palpitations no passing out.   Past Medical History:  Diagnosis Date  . Arthritis    end stage right hip  . Basal cell carcinoma   . Chronic kidney disease    stage III, related to high blood pressure medication  . Colon polyp   . Depression   . History of chest pain   . History of dizziness   . History of palpitations   . Hyperlipidemia   . Hypertension     Past Surgical  History:  Procedure Laterality Date  . CATARACT EXTRACTION, BILATERAL    . COLONOSCOPY    . DIAGNOSTIC LAPAROSCOPY    . DILATION AND CURETTAGE OF UTERUS      Current Medications: Current Meds  Medication Sig  . amLODipine (NORVASC) 2.5 MG tablet Take 2.5 mg by mouth daily.  Marland Kitchen lisinopril (PRINIVIL,ZESTRIL) 20 MG tablet Take 20 mg by mouth daily.  . metoprolol succinate (TOPROL-XL) 50 MG 24 hr tablet Take 12.5 mg by mouth daily.   . simvastatin (ZOCOR) 40 MG tablet Take 40 mg by mouth daily.     Allergies:   Patient has no known allergies.   Social History   Socioeconomic History  . Marital status: Married    Spouse name: [name]  . Number of children: 1  . Years of education: college  . Highest education level: Not on file  Occupational History  . Occupation: retired  Scientific laboratory technician  . Financial resource strain: Not on file  . Food insecurity:    Worry: Not on file    Inability: Not on file  . Transportation needs:    Medical: Not on file    Non-medical: Not on file  Tobacco Use  . Smoking status: Never Smoker  . Smokeless tobacco: Never Used  Substance and Sexual Activity  . Alcohol use: No    Alcohol/week: 0.0 oz  . Drug use: No  . Sexual activity: Not on file  Lifestyle  . Physical activity:    Days per week: Not on file    Minutes per session: Not on file  . Stress: Not on file  Relationships  . Social connections:    Talks on phone: Not on file    Gets together: Not on file    Attends religious service: Not on file    Active member of club or organization: Not on file    Attends meetings of clubs or organizations: Not on file    Relationship status: Not on file  Other Topics Concern  . Not on file  Social History Narrative  . Not on file     Family History: The patient's family history includes Alzheimer's disease in her brother and mother; Diabetes in her mother; Heart attack (age of onset: 72) in her father; Heart disease in her father. ROS:     Please see the history of present illness.    All 14 point review of systems negative except as described per history of present illness.  EKGs/Labs/Other Studies Reviewed:    The following studies were reviewed today: EKG from primary care physician office which was poor quality however EKG there were distant in quality showed clearly sinus rhythm actually sinus bradycardia with nonspecific ST segment changes.  EKG:  EKG is  ordered today.  The ekg ordered today demonstrates normal sinus rhythm rate 61.  Minimally prolonged PR interval 2 208 ms, poor R wave progression anterior precordium.  No ST segment changes  Recent Labs: 01/19/2018: BUN 39; Creatinine, Ser 1.21; Hemoglobin 12.9; Platelets 170; Potassium 5.0; Sodium 140  Recent Lipid Panel No results found for: CHOL, TRIG, HDL, CHOLHDL, VLDL, LDLCALC, LDLDIRECT  Physical Exam:    VS:  BP 120/68 (BP Location: Right Arm)   Pulse 68   Ht 5' 3.5" (1.613 m)   Wt 217 lb 1.9 oz (98.5 kg)   SpO2 98%   BMI 37.86 kg/m     Wt Readings from Last 3 Encounters:  01/20/18 217 lb 1.9 oz (98.5 kg)  01/19/18 217 lb 4 oz (98.5 kg)  02/06/15 230 lb (104.3 kg)     GEN:  Well nourished, well developed in no acute distress HEENT: Normal NECK: No JVD; No carotid bruits LYMPHATICS: No lymphadenopathy CARDIAC: RRR, no murmurs, no rubs, no gallops RESPIRATORY:  Clear to auscultation without rales, wheezing or rhonchi  ABDOMEN: Soft, non-tender, non-distended MUSCULOSKELETAL:  No edema; No deformity  SKIN: Warm and dry NEUROLOGIC:  Alert and oriented x 3 PSYCHIATRIC:  Normal affect   ASSESSMENT:    1. Benign essential HTN   2. Heart palpitations   3. Sinus bradycardia    PLAN:    In order of problems listed above:  1. Preop evaluation for this nice lady with risk factors for coronary artery disease.  Namely she does have history of hypertension.  She does have history of dyslipidemia both excellently managed.  She is doing quite well.   She is able to do line dancing she is able to go to the store and shop she is able to climb 1 flight of stairs that she got at home, therefore I think from cardiac standpoint review should be fine to proceed with surgery as scheduled. 2. Sinus bradycardia: I do think is critical.  She takes very small dose of beta-blocker.  Her beta-blocker recently has been decreased because of resting bradycardia.  She is asymptomatic without dizziness or palpitations or passing out.  I will try to see if we will be able to do Holter monitor on her before surgery however I did not think that this is a critical test if we will not be able to perform it I still think with careful monitor she will be able to have the surgery done.  I think it would be reasonable to continue very small dose beta-blocker before her surgery and this is for the fact that she does have some APCs that we try to control.  If she will develop some significant bradycardia especially when asymptomatic some atropine can be used to increase heart rate. 3. Dyslipidemia: She is taking simvastatin which I will continue and will maintain this medication the right surgical time. 4. Essential hypertension: Appears to be well controlled.   Medication Adjustments/Labs and Tests Ordered: Current medicines are reviewed at length with the patient today.  Concerns regarding medicines are outlined above.  No orders of the defined types were placed in this encounter.  No orders of the defined types were placed in this encounter.   Signed, Park Liter, MD, [facility]. 01/20/2018 11:23 AM    [place]

## 2018-01-23 MED ORDER — TRANEXAMIC ACID 1000 MG/10ML IV SOLN
1000.0000 mg | INTRAVENOUS | Status: AC
  Administered 2018-01-24: 1000 mg via INTRAVENOUS
  Filled 2018-01-23: qty 1100

## 2018-01-23 NOTE — Pre-Procedure Instructions (Signed)
PCR results 01/19/2018 faxed to Dr. Alvan Dame via epic. Cardiac clearance Dr. Agustin Cree 01/20/2018 in epic.

## 2018-01-23 NOTE — Addendum Note (Signed)
Addended by: Anselm Pancoast on: 01/23/2018 09:31 AM   Modules accepted: Orders

## 2018-01-24 ENCOUNTER — Other Ambulatory Visit: Payer: Self-pay

## 2018-01-24 ENCOUNTER — Inpatient Hospital Stay (HOSPITAL_COMMUNITY): Payer: Medicare Other | Admitting: Registered Nurse

## 2018-01-24 ENCOUNTER — Inpatient Hospital Stay (HOSPITAL_COMMUNITY): Payer: Medicare Other

## 2018-01-24 ENCOUNTER — Encounter (HOSPITAL_COMMUNITY): Payer: Self-pay

## 2018-01-24 ENCOUNTER — Inpatient Hospital Stay (HOSPITAL_COMMUNITY)
Admission: RE | Admit: 2018-01-24 | Discharge: 2018-01-25 | DRG: 470 | Disposition: A | Discharge status: Home / Self Care | Payer: Medicare Other | Source: Ambulatory Visit | Attending: Orthopedic Surgery | Admitting: Orthopedic Surgery

## 2018-01-24 ENCOUNTER — Encounter (HOSPITAL_COMMUNITY)
Admission: RE | Disposition: A | Discharge status: Home / Self Care | Payer: Self-pay | Source: Ambulatory Visit | Attending: Orthopedic Surgery

## 2018-01-24 DIAGNOSIS — E785 Hyperlipidemia, unspecified: Secondary | ICD-10-CM | POA: Diagnosis present

## 2018-01-24 DIAGNOSIS — Z96649 Presence of unspecified artificial hip joint: Secondary | ICD-10-CM

## 2018-01-24 DIAGNOSIS — Z9181 History of falling: Secondary | ICD-10-CM | POA: Diagnosis not present

## 2018-01-24 DIAGNOSIS — Z6838 Body mass index (BMI) 38.0-38.9, adult: Secondary | ICD-10-CM

## 2018-01-24 DIAGNOSIS — Z7982 Long term (current) use of aspirin: Secondary | ICD-10-CM

## 2018-01-24 DIAGNOSIS — I129 Hypertensive chronic kidney disease with stage 1 through stage 4 chronic kidney disease, or unspecified chronic kidney disease: Secondary | ICD-10-CM | POA: Diagnosis present

## 2018-01-24 DIAGNOSIS — N183 Chronic kidney disease, stage 3 (moderate): Secondary | ICD-10-CM | POA: Diagnosis present

## 2018-01-24 DIAGNOSIS — Z85828 Personal history of other malignant neoplasm of skin: Secondary | ICD-10-CM | POA: Diagnosis not present

## 2018-01-24 DIAGNOSIS — Z8249 Family history of ischemic heart disease and other diseases of the circulatory system: Secondary | ICD-10-CM

## 2018-01-24 DIAGNOSIS — Z79899 Other long term (current) drug therapy: Secondary | ICD-10-CM

## 2018-01-24 DIAGNOSIS — M1611 Unilateral primary osteoarthritis, right hip: Secondary | ICD-10-CM | POA: Diagnosis present

## 2018-01-24 DIAGNOSIS — E669 Obesity, unspecified: Secondary | ICD-10-CM | POA: Diagnosis present

## 2018-01-24 HISTORY — DX: Presence of unspecified artificial hip joint: Z96.649

## 2018-01-24 HISTORY — PX: TOTAL HIP ARTHROPLASTY: SHX124

## 2018-01-24 LAB — TYPE AND SCREEN
ABO/RH(D): AB NEG
ANTIBODY SCREEN: NEGATIVE

## 2018-01-24 LAB — BASIC METABOLIC PANEL
Anion gap: 10 (ref 5–15)
BUN: 33 mg/dL — AB (ref 6–20)
CHLORIDE: 108 mmol/L (ref 101–111)
CO2: 23 mmol/L (ref 22–32)
Calcium: 9.6 mg/dL (ref 8.9–10.3)
Creatinine, Ser: 1.43 mg/dL — ABNORMAL HIGH (ref 0.44–1.00)
GFR calc Af Amer: 41 mL/min — ABNORMAL LOW (ref >60)
GFR calc non Af Amer: 36 mL/min — ABNORMAL LOW (ref >60)
Glucose, Bld: 97 mg/dL (ref 65–99)
Potassium: 4.7 mmol/L (ref 3.5–5.1)
Sodium: 141 mmol/L (ref 135–145)

## 2018-01-24 SURGERY — ARTHROPLASTY, HIP, TOTAL, ANTERIOR APPROACH
Anesthesia: Spinal | Site: Hip | Laterality: Right

## 2018-01-24 MED ORDER — MENTHOL 3 MG MT LOZG: 1.0000 | LOZENGE | OROMUCOSAL | Status: DC | PRN

## 2018-01-24 MED ORDER — BUPIVACAINE IN DEXTROSE 0.75-8.25 % IT SOLN
INTRATHECAL | Status: DC | PRN
  Administered 2018-01-24: 1.8 mL via INTRATHECAL

## 2018-01-24 MED ORDER — CELECOXIB 200 MG PO CAPS
200.0000 mg | ORAL_CAPSULE | Freq: Two times a day (BID) | ORAL | Status: DC
  Administered 2018-01-24 – 2018-01-25 (×3): 200 mg via ORAL
  Filled 2018-01-24 (×3): qty 1

## 2018-01-24 MED ORDER — AMLODIPINE BESYLATE 5 MG PO TABS
2.5000 mg | ORAL_TABLET | Freq: Every day | ORAL | Status: DC
  Filled 2018-01-24: qty 1

## 2018-01-24 MED ORDER — DOCUSATE SODIUM 100 MG PO CAPS: 100.0000 mg | ORAL_CAPSULE | Freq: Two times a day (BID) | ORAL | 0 refills | Status: DC

## 2018-01-24 MED ORDER — HYDROCODONE-ACETAMINOPHEN 7.5-325 MG PO TABS: 1.0000 | ORAL_TABLET | ORAL | 0 refills | Status: DC | PRN

## 2018-01-24 MED ORDER — ONDANSETRON HCL 4 MG/2ML IJ SOLN
INTRAMUSCULAR | Status: DC | PRN
  Administered 2018-01-24: 4 mg via INTRAVENOUS

## 2018-01-24 MED ORDER — EPHEDRINE 5 MG/ML INJ
INTRAVENOUS | Status: AC
  Filled 2018-01-24: qty 10

## 2018-01-24 MED ORDER — ACETAMINOPHEN 325 MG PO TABS: 325.0000 mg | ORAL_TABLET | Freq: Four times a day (QID) | ORAL | Status: DC | PRN

## 2018-01-24 MED ORDER — DOCUSATE SODIUM 100 MG PO CAPS
100.0000 mg | ORAL_CAPSULE | Freq: Two times a day (BID) | ORAL | Status: DC
  Administered 2018-01-24 – 2018-01-25 (×2): 100 mg via ORAL
  Filled 2018-01-24 (×2): qty 1

## 2018-01-24 MED ORDER — DIPHENHYDRAMINE HCL 12.5 MG/5ML PO ELIX: 12.5000 mg | ORAL_SOLUTION | ORAL | Status: DC | PRN

## 2018-01-24 MED ORDER — METHOCARBAMOL 1000 MG/10ML IJ SOLN
500.0000 mg | Freq: Four times a day (QID) | INTRAVENOUS | Status: DC | PRN
  Administered 2018-01-24: 500 mg via INTRAVENOUS
  Filled 2018-01-24: qty 550

## 2018-01-24 MED ORDER — METHOCARBAMOL 500 MG PO TABS: 500.0000 mg | ORAL_TABLET | Freq: Four times a day (QID) | ORAL | 0 refills | Status: DC | PRN

## 2018-01-24 MED ORDER — ONDANSETRON HCL 4 MG/2ML IJ SOLN
INTRAMUSCULAR | Status: AC
  Filled 2018-01-24: qty 2

## 2018-01-24 MED ORDER — HYDROCODONE-ACETAMINOPHEN 7.5-325 MG PO TABS: 1.0000 | ORAL_TABLET | ORAL | Status: DC | PRN

## 2018-01-24 MED ORDER — FERROUS SULFATE 325 (65 FE) MG PO TABS
325.0000 mg | ORAL_TABLET | Freq: Three times a day (TID) | ORAL | Status: DC
  Administered 2018-01-24 – 2018-01-25 (×3): 325 mg via ORAL
  Filled 2018-01-24 (×3): qty 1

## 2018-01-24 MED ORDER — SODIUM CHLORIDE 0.9 % IV SOLN
INTRAVENOUS | Status: DC
  Administered 2018-01-24 – 2018-01-25 (×2): via INTRAVENOUS

## 2018-01-24 MED ORDER — MAGNESIUM CITRATE PO SOLN: 1.0000 | Freq: Once | ORAL | Status: DC | PRN

## 2018-01-24 MED ORDER — TRANEXAMIC ACID 1000 MG/10ML IV SOLN
1000.0000 mg | Freq: Once | INTRAVENOUS | Status: AC
  Administered 2018-01-24: 1000 mg via INTRAVENOUS
  Filled 2018-01-24: qty 1100

## 2018-01-24 MED ORDER — FENTANYL CITRATE (PF) 100 MCG/2ML IJ SOLN
INTRAMUSCULAR | Status: DC | PRN
  Administered 2018-01-24: 50 ug via INTRAVENOUS

## 2018-01-24 MED ORDER — ONDANSETRON HCL 4 MG/2ML IJ SOLN: 4.0000 mg | Freq: Four times a day (QID) | INTRAMUSCULAR | Status: DC | PRN

## 2018-01-24 MED ORDER — MIDAZOLAM HCL 5 MG/5ML IJ SOLN
INTRAMUSCULAR | Status: DC | PRN
  Administered 2018-01-24: 2 mg via INTRAVENOUS

## 2018-01-24 MED ORDER — PROPOFOL 10 MG/ML IV BOLUS
INTRAVENOUS | Status: AC
  Filled 2018-01-24: qty 40

## 2018-01-24 MED ORDER — CHLORHEXIDINE GLUCONATE 4 % EX LIQD: 60.0000 mL | Freq: Once | CUTANEOUS | Status: DC

## 2018-01-24 MED ORDER — ONDANSETRON HCL 4 MG PO TABS: 4.0000 mg | ORAL_TABLET | Freq: Four times a day (QID) | ORAL | Status: DC | PRN

## 2018-01-24 MED ORDER — FENTANYL CITRATE (PF) 100 MCG/2ML IJ SOLN
25.0000 ug | INTRAMUSCULAR | Status: DC | PRN
  Administered 2018-01-24 (×2): 25 ug via INTRAVENOUS
  Administered 2018-01-24: 50 ug via INTRAVENOUS
  Administered 2018-01-24 (×2): 25 ug via INTRAVENOUS

## 2018-01-24 MED ORDER — METOCLOPRAMIDE HCL 5 MG PO TABS: 5.0000 mg | ORAL_TABLET | Freq: Three times a day (TID) | ORAL | Status: DC | PRN

## 2018-01-24 MED ORDER — METOCLOPRAMIDE HCL 5 MG/ML IJ SOLN: 5.0000 mg | Freq: Three times a day (TID) | INTRAMUSCULAR | Status: DC | PRN

## 2018-01-24 MED ORDER — ASPIRIN 81 MG PO CHEW: 81.0000 mg | CHEWABLE_TABLET | Freq: Two times a day (BID) | ORAL | 0 refills | Status: AC

## 2018-01-24 MED ORDER — PHENYLEPHRINE 40 MCG/ML (10ML) SYRINGE FOR IV PUSH (FOR BLOOD PRESSURE SUPPORT)
PREFILLED_SYRINGE | INTRAVENOUS | Status: DC | PRN
  Administered 2018-01-24 (×2): 40 ug via INTRAVENOUS

## 2018-01-24 MED ORDER — PHENYLEPHRINE 40 MCG/ML (10ML) SYRINGE FOR IV PUSH (FOR BLOOD PRESSURE SUPPORT)
PREFILLED_SYRINGE | INTRAVENOUS | Status: AC
  Filled 2018-01-24: qty 10

## 2018-01-24 MED ORDER — POLYETHYLENE GLYCOL 3350 17 G PO PACK
17.0000 g | PACK | Freq: Two times a day (BID) | ORAL | Status: DC
  Administered 2018-01-24 – 2018-01-25 (×2): 17 g via ORAL
  Filled 2018-01-24 (×2): qty 1

## 2018-01-24 MED ORDER — DEXAMETHASONE SODIUM PHOSPHATE 10 MG/ML IJ SOLN
10.0000 mg | Freq: Once | INTRAMUSCULAR | Status: AC
  Administered 2018-01-24: 10 mg via INTRAVENOUS

## 2018-01-24 MED ORDER — DEXAMETHASONE SODIUM PHOSPHATE 10 MG/ML IJ SOLN
10.0000 mg | Freq: Once | INTRAMUSCULAR | Status: AC
  Administered 2018-01-25: 10 mg via INTRAVENOUS
  Filled 2018-01-24: qty 1

## 2018-01-24 MED ORDER — METOPROLOL SUCCINATE ER 25 MG PO TB24
12.5000 mg | ORAL_TABLET | Freq: Every day | ORAL | Status: DC
  Administered 2018-01-25: 12.5 mg via ORAL
  Filled 2018-01-24: qty 1

## 2018-01-24 MED ORDER — FERROUS SULFATE 325 (65 FE) MG PO TABS: 325.0000 mg | ORAL_TABLET | Freq: Three times a day (TID) | ORAL | 3 refills | Status: DC

## 2018-01-24 MED ORDER — CEFAZOLIN SODIUM-DEXTROSE 2-4 GM/100ML-% IV SOLN
2.0000 g | Freq: Four times a day (QID) | INTRAVENOUS | Status: AC
  Administered 2018-01-24 (×2): 2 g via INTRAVENOUS
  Filled 2018-01-24 (×2): qty 100

## 2018-01-24 MED ORDER — DEXAMETHASONE SODIUM PHOSPHATE 10 MG/ML IJ SOLN
INTRAMUSCULAR | Status: AC
  Filled 2018-01-24: qty 1

## 2018-01-24 MED ORDER — LACTATED RINGERS IV SOLN
INTRAVENOUS | Status: DC
Start: 2018-01-24 — End: 2018-01-24
  Administered 2018-01-24: 08:00:00 via INTRAVENOUS

## 2018-01-24 MED ORDER — STERILE WATER FOR IRRIGATION IR SOLN
Status: DC | PRN
  Administered 2018-01-24: 1000 mL

## 2018-01-24 MED ORDER — ALUM & MAG HYDROXIDE-SIMETH 200-200-20 MG/5ML PO SUSP
15.0000 mL | ORAL | Status: DC | PRN
  Administered 2018-01-25: 15 mL via ORAL
  Filled 2018-01-24: qty 30

## 2018-01-24 MED ORDER — SIMVASTATIN 20 MG PO TABS: 40.0000 mg | ORAL_TABLET | Freq: Every day | ORAL | Status: DC

## 2018-01-24 MED ORDER — POLYETHYLENE GLYCOL 3350 17 G PO PACK: 17.0000 g | PACK | Freq: Two times a day (BID) | ORAL | 0 refills | Status: DC

## 2018-01-24 MED ORDER — MIDAZOLAM HCL 2 MG/2ML IJ SOLN
INTRAMUSCULAR | Status: AC
  Filled 2018-01-24: qty 2

## 2018-01-24 MED ORDER — HYDROCODONE-ACETAMINOPHEN 5-325 MG PO TABS
1.0000 | ORAL_TABLET | ORAL | Status: DC | PRN
  Administered 2018-01-24 – 2018-01-25 (×5): 1 via ORAL
  Filled 2018-01-24 (×5): qty 1

## 2018-01-24 MED ORDER — BISACODYL 10 MG RE SUPP: 10.0000 mg | Freq: Every day | RECTAL | Status: DC | PRN

## 2018-01-24 MED ORDER — EPHEDRINE SULFATE-NACL 50-0.9 MG/10ML-% IV SOSY
PREFILLED_SYRINGE | INTRAVENOUS | Status: DC | PRN
  Administered 2018-01-24: 5 mg via INTRAVENOUS
  Administered 2018-01-24: 10 mg via INTRAVENOUS

## 2018-01-24 MED ORDER — MORPHINE SULFATE (PF) 2 MG/ML IV SOLN: 0.5000 mg | INTRAVENOUS | Status: DC | PRN

## 2018-01-24 MED ORDER — FENTANYL CITRATE (PF) 100 MCG/2ML IJ SOLN
INTRAMUSCULAR | Status: AC
  Administered 2018-01-24: 25 ug via INTRAVENOUS
  Filled 2018-01-24: qty 4

## 2018-01-24 MED ORDER — SODIUM CHLORIDE 0.9 % IR SOLN
Status: DC | PRN
  Administered 2018-01-24: 1000 mL

## 2018-01-24 MED ORDER — ATORVASTATIN CALCIUM 20 MG PO TABS: 20.0000 mg | ORAL_TABLET | Freq: Every day | ORAL | Status: DC

## 2018-01-24 MED ORDER — PROPOFOL 10 MG/ML IV BOLUS
INTRAVENOUS | Status: AC
  Filled 2018-01-24: qty 20

## 2018-01-24 MED ORDER — PHENOL 1.4 % MT LIQD: 1.0000 | OROMUCOSAL | Status: DC | PRN

## 2018-01-24 MED ORDER — PROPOFOL 500 MG/50ML IV EMUL
INTRAVENOUS | Status: DC | PRN
  Administered 2018-01-24: 40 ug/kg/min via INTRAVENOUS

## 2018-01-24 MED ORDER — CEFAZOLIN SODIUM-DEXTROSE 2-4 GM/100ML-% IV SOLN
2.0000 g | INTRAVENOUS | Status: AC
  Administered 2018-01-24: 2 g via INTRAVENOUS
  Filled 2018-01-24: qty 100

## 2018-01-24 MED ORDER — FENTANYL CITRATE (PF) 100 MCG/2ML IJ SOLN
INTRAMUSCULAR | Status: AC
  Filled 2018-01-24: qty 2

## 2018-01-24 MED ORDER — ASPIRIN 81 MG PO CHEW
81.0000 mg | CHEWABLE_TABLET | Freq: Two times a day (BID) | ORAL | Status: DC
  Administered 2018-01-24 – 2018-01-25 (×2): 81 mg via ORAL
  Filled 2018-01-24 (×2): qty 1

## 2018-01-24 MED ORDER — METHOCARBAMOL 500 MG PO TABS
500.0000 mg | ORAL_TABLET | Freq: Four times a day (QID) | ORAL | Status: DC | PRN
  Administered 2018-01-24 – 2018-01-25 (×2): 500 mg via ORAL
  Filled 2018-01-24 (×2): qty 1

## 2018-01-24 MED ORDER — ONDANSETRON HCL 4 MG/2ML IJ SOLN: 4.0000 mg | Freq: Once | INTRAMUSCULAR | Status: DC | PRN

## 2018-01-24 SURGICAL SUPPLY — 36 items
BAG DECANTER FOR FLEXI CONT (MISCELLANEOUS) IMPLANT
BAG ZIPLOCK 12X15 (MISCELLANEOUS) IMPLANT
BLADE SAG 18X100X1.27 (BLADE) ×3 IMPLANT
CAPT HIP TOTAL 2 ×3 IMPLANT
CLOTH BEACON ORANGE TIMEOUT ST (SAFETY) ×3 IMPLANT
COVER PERINEAL POST (MISCELLANEOUS) ×3 IMPLANT
COVER SURGICAL LIGHT HANDLE (MISCELLANEOUS) ×3 IMPLANT
DERMABOND ADVANCED (GAUZE/BANDAGES/DRESSINGS) ×2
DERMABOND ADVANCED .7 DNX12 (GAUZE/BANDAGES/DRESSINGS) ×1 IMPLANT
DRAPE STERI IOBAN 125X83 (DRAPES) ×3 IMPLANT
DRAPE U-SHAPE 47X51 STRL (DRAPES) ×6 IMPLANT
DRESSING AQUACEL AG SP 3.5X10 (GAUZE/BANDAGES/DRESSINGS) ×1 IMPLANT
DRSG AQUACEL AG SP 3.5X10 (GAUZE/BANDAGES/DRESSINGS) ×3
DURAPREP 26ML APPLICATOR (WOUND CARE) ×3 IMPLANT
ELECT REM PT RETURN 15FT ADLT (MISCELLANEOUS) ×3 IMPLANT
GLOVE BIOGEL M STRL SZ7.5 (GLOVE) IMPLANT
GLOVE BIOGEL PI IND STRL 7.5 (GLOVE) ×1 IMPLANT
GLOVE BIOGEL PI IND STRL 8.5 (GLOVE) ×1 IMPLANT
GLOVE BIOGEL PI INDICATOR 7.5 (GLOVE) ×2
GLOVE BIOGEL PI INDICATOR 8.5 (GLOVE) ×2
GLOVE ECLIPSE 8.0 STRL XLNG CF (GLOVE) ×6 IMPLANT
GLOVE ORTHO TXT STRL SZ7.5 (GLOVE) ×3 IMPLANT
GOWN STRL REUS W/TWL LRG LVL3 (GOWN DISPOSABLE) ×6 IMPLANT
GOWN STRL REUS W/TWL XL LVL4 (GOWN DISPOSABLE) ×6 IMPLANT
HOLDER FOLEY CATH W/STRAP (MISCELLANEOUS) ×3 IMPLANT
PACK ANTERIOR HIP CUSTOM (KITS) ×3 IMPLANT
SUT MNCRL AB 4-0 PS2 18 (SUTURE) ×3 IMPLANT
SUT STRATAFIX 0 PDS 27 VIOLET (SUTURE) ×3
SUT VIC AB 1 CT1 36 (SUTURE) ×9 IMPLANT
SUT VIC AB 2-0 CT1 27 (SUTURE) ×4
SUT VIC AB 2-0 CT1 TAPERPNT 27 (SUTURE) ×2 IMPLANT
SUTURE STRATFX 0 PDS 27 VIOLET (SUTURE) ×1 IMPLANT
TRAY FOLEY CATH 14FR (SET/KITS/TRAYS/PACK) ×3 IMPLANT
TRAY FOLEY W/METER SILVER 16FR (SET/KITS/TRAYS/PACK) IMPLANT
WATER STERILE IRR 1000ML POUR (IV SOLUTION) ×3 IMPLANT
YANKAUER SUCT BULB TIP 10FT TU (MISCELLANEOUS) IMPLANT

## 2018-01-24 NOTE — Anesthesia Postprocedure Evaluation (Signed)
Anesthesia Post Note  Patient: Melissa Clements  Procedure(s) Performed: RIGHT TOTAL HIP ARTHROPLASTY ANTERIOR APPROACH (Right Hip)     Patient location during evaluation: PACU Anesthesia Type: Spinal Level of consciousness: oriented and awake and alert Pain management: pain level controlled Vital Signs Assessment: post-procedure vital signs reviewed and stable Respiratory status: spontaneous breathing, respiratory function stable and patient connected to nasal cannula oxygen Cardiovascular status: blood pressure returned to baseline and stable Postop Assessment: no headache, no backache, no apparent nausea or vomiting and spinal receding Anesthetic complications: no    Last Vitals:  Vitals:   01/24/18 1410 01/24/18 1517  BP: (!) 153/68 (!) 148/63  Pulse: (!) 57 62  Resp: 16 16  Temp: (!) 36.1 C (!) 35.9 C  SpO2: 100% 100%    Last Pain:  Vitals:   01/24/18 1529  TempSrc:   PainSc: 0-No pain                 Ruxin Ransome P Niamya Vittitow

## 2018-01-24 NOTE — Progress Notes (Signed)
The order for simvastatin(Zocor) was changed to an equivalent dose of atorvastatin(Lipitor) due to the potential drug interaction with amlodipine.  When taken in combination with medications that inhibit its metabolism, simvastatin can accumulate which increases the risk of liver toxicity, myopathy, or rhabdomyolysis.  Simvastatin dose should not exceed 10mg /day in patients taking verapamil, diltiazem, fibrates, or niacin >or= 1g/day.   Simvastatin dose should not exceed 20mg /day in patients taking amlodipine, ranolazine or amiodarone.   Please consider this potential interaction at discharge.   Royetta Asal, PharmD, BCPS Pager [phone] 01/24/2018 2:48 PM

## 2018-01-24 NOTE — Evaluation (Signed)
Physical Therapy Evaluation Patient Details Name: Melissa Clements MRN: [id number] DOB: [birth year] Today's Date: 01/24/2018   History of Present Illness  72 yo female s/p R THA-direct anterior 01/24/18  Clinical Impression  On eval POD 0, pt required Min assist for mobility. She walked ~30 feet with a RW. Distance was limited by pain and fatigue. Will progress activity as tolerated. Per chart, plan is for pt to d/c home with a HEP.     Follow Up Recommendations Follow surgeon's recommendation for DC plan and follow-up therapies    Equipment Recommendations  None recommended by PT    Recommendations for Other Services       Precautions / Restrictions Precautions Precautions: Fall Restrictions Weight Bearing Restrictions: No Other Position/Activity Restrictions: WBAT      Mobility  Bed Mobility Overal bed mobility: Needs Assistance Bed Mobility: Supine to Sit     Supine to sit: Min assist;HOB elevated     General bed mobility comments: Assist for R LE. Increased time.   Transfers Overall transfer level: Needs assistance Equipment used: Rolling walker (2 wheeled) Transfers: Sit to/from Stand Sit to Stand: Min assist         General transfer comment: Assist to rise, stabilize, control descent. VCs safety, technique, hand placement.   Ambulation/Gait Ambulation/Gait assistance: Min assist Ambulation Distance (Feet): 30 Feet Assistive device: Rolling walker (2 wheeled) Gait Pattern/deviations: Step-to pattern;Antalgic     General Gait Details: Intermittent assist to steady. VCs safety, sequence, posture. close guard for safety. followed with recliner. Distance limited by pain and fatigue.   Stairs            Wheelchair Mobility    Modified Rankin (Stroke Patients Only)       Balance Overall balance assessment: Needs assistance         Standing balance support: Bilateral upper extremity supported Standing balance-Leahy Scale: Poor                                Pertinent Vitals/Pain Pain Assessment: 0-10 Pain Score: 7  Pain Location: R hip Pain Descriptors / Indicators: Sore;Aching;Discomfort Pain Intervention(s): Limited activity within patient's tolerance;Repositioned    Home Living Family/patient expects to be discharged to:: Private residence Living Arrangements: Spouse/significant other;Children Available Help at Discharge: Family Type of Home: House Home Access: Stairs to enter Entrance Stairs-Rails: Right;Left(can't reach both) Technical brewer of Steps: 5 Home Layout: Able to live on main level with bedroom/bathroom;Two level Home Equipment: Walker - 2 wheels;Cane - single point;Adaptive equipment;Toilet riser      Prior Function Level of Independence: Independent               Hand Dominance        Extremity/Trunk Assessment   Upper Extremity Assessment Upper Extremity Assessment: Overall WFL for tasks assessed    Lower Extremity Assessment Lower Extremity Assessment: Generalized weakness(s/p R THA)    Cervical / Trunk Assessment Cervical / Trunk Assessment: Kyphotic  Communication   Communication: No difficulties  Cognition Arousal/Alertness: Awake/alert Behavior During Therapy: WFL for tasks assessed/performed Overall Cognitive Status: Within Functional Limits for tasks assessed                                        General Comments      Exercises     Assessment/Plan    PT Assessment  Patient needs continued PT services  PT Problem List Decreased strength;Decreased range of motion;Decreased mobility;Decreased activity tolerance;Decreased balance;Decreased knowledge of use of DME       PT Treatment Interventions DME instruction;Gait training;Functional mobility training;Therapeutic activities;Balance training;Patient/family education;Therapeutic exercise;Stair training    PT Goals (Current goals can be found in the Care Plan section)  Acute Rehab  PT Goals Patient Stated Goal: regain independence. improved posture. less pain.  PT Goal Formulation: With patient Time For Goal Achievement: 02/07/18 Potential to Achieve Goals: Good    Frequency 7X/week   Barriers to discharge        Co-evaluation               AM-PAC PT "6 Clicks" Daily Activity  Outcome Measure Difficulty turning over in bed (including adjusting bedclothes, sheets and blankets)?: Unable Difficulty moving from lying on back to sitting on the side of the bed? : Unable Difficulty sitting down on and standing up from a chair with arms (e.g., wheelchair, bedside commode, etc,.)?: Unable Help needed moving to and from a bed to chair (including a wheelchair)?: A Little Help needed walking in hospital room?: A Little Help needed climbing 3-5 steps with a railing? : A Lot 6 Click Score: 11    End of Session Equipment Utilized During Treatment: Gait belt Activity Tolerance: Patient limited by fatigue;Patient limited by pain Patient left: in chair;with call bell/phone within reach   PT Visit Diagnosis: Muscle weakness (generalized) (M62.81);Difficulty in walking, not elsewhere classified (R26.2);Pain Pain - Right/Left: Right Pain - part of body: Hip    Time: 6811-5726 PT Time Calculation (min) (ACUTE ONLY): 15 min   Charges:   PT Evaluation $PT Eval Low Complexity: 1 Low     PT G Codes:          Weston Anna, MPT Pager: [phone]

## 2018-01-24 NOTE — Discharge Instructions (Signed)

## 2018-01-24 NOTE — Anesthesia Procedure Notes (Signed)
Spinal  Patient location during procedure: OR Start time: 01/24/2018 9:50 AM End time: 01/24/2018 10:00 AM Staffing Anesthesiologist: Murvin Natal, MD Performed: anesthesiologist  Preanesthetic Checklist Completed: patient identified, surgical consent, pre-op evaluation, timeout performed, IV checked, risks and benefits discussed and monitors and equipment checked Spinal Block Patient position: sitting Prep: DuraPrep Patient monitoring: cardiac monitor, continuous pulse ox and blood pressure Approach: midline Location: L4-5 Injection technique: single-shot Needle Needle type: Pencan  Needle gauge: 24 G Needle length: 9 cm Assessment Sensory level: T10 Additional Notes Functioning IV was confirmed and monitors were applied. Sterile prep and drape, including hand hygiene and sterile gloves were used. The patient was positioned and the spine was prepped. The skin was anesthetized with lidocaine.  Free flow of clear CSF was obtained prior to injecting local anesthetic into the CSF.  The spinal needle aspirated freely following injection.  The needle was carefully withdrawn.  The patient tolerated the procedure well.

## 2018-01-24 NOTE — Interval H&P Note (Signed)
History and Physical Interval Note:  01/24/2018 8:56 AM  Melissa Clements  has presented today for surgery, with the diagnosis of Right hip osteoarthritis  The various methods of treatment have been discussed with the patient and family. After consideration of risks, benefits and other options for treatment, the patient has consented to  Procedure(s) with comments: RIGHT TOTAL HIP ARTHROPLASTY ANTERIOR APPROACH (Right) - 70 mins as a surgical intervention .  The patient's history has been reviewed, patient examined, no change in status, stable for surgery.  I have reviewed the patient's chart and labs.  Questions were answered to the patient's satisfaction.     Mauri Pole

## 2018-01-24 NOTE — Anesthesia Preprocedure Evaluation (Addendum)
Anesthesia Evaluation  Patient identified by MRN, date of birth, ID band Patient awake    Reviewed: Allergy & Precautions, NPO status , Patient's Chart, lab work & pertinent test results  Airway Mallampati: III  TM Distance: >3 FB Neck ROM: Full    Dental no notable dental hx.    Pulmonary neg pulmonary ROS,    Pulmonary exam normal breath sounds clear to auscultation       Cardiovascular hypertension, Pt. on medications and Pt. on home beta blockers Normal cardiovascular exam Rhythm:Regular Rate:Normal  ECG: NSR, rate 61   Neuro/Psych PSYCHIATRIC DISORDERS Depression negative neurological ROS     GI/Hepatic negative GI ROS, Neg liver ROS, Colon polyp   Endo/Other  negative endocrine ROS  Renal/GU Renal disease     Musculoskeletal  (+) Arthritis , Osteoarthritis,    Abdominal (+) + obese,   Peds  Hematology HLD   Anesthesia Other Findings Right hip osteoarthritis  Reproductive/Obstetrics                            Anesthesia Physical Anesthesia Plan  ASA: II  Anesthesia Plan: Spinal   Post-op Pain Management:    Induction: Intravenous  PONV Risk Score and Plan: 2 and Ondansetron and Treatment may vary due to age or medical condition  Airway Management Planned: Natural Airway  Additional Equipment:   Intra-op Plan:   Post-operative Plan:   Informed Consent: I have reviewed the patients History and Physical, chart, labs and discussed the procedure including the risks, benefits and alternatives for the proposed anesthesia with the patient or authorized representative who has indicated his/her understanding and acceptance.   Dental advisory given  Plan Discussed with: CRNA  Anesthesia Plan Comments:         Anesthesia Quick Evaluation

## 2018-01-24 NOTE — Transfer of Care (Signed)
Immediate Anesthesia Transfer of Care Note  Patient: Melissa Clements  Procedure(s) Performed: RIGHT TOTAL HIP ARTHROPLASTY ANTERIOR APPROACH (Right Hip)  Patient Location: PACU  Anesthesia Type:Spinal  Level of Consciousness: awake  Airway & Oxygen Therapy: Patient Spontanous Breathing and Patient connected to face mask oxygen  Post-op Assessment: Report given to RN and Post -op Vital signs reviewed and stable  Post vital signs: Reviewed and stable  Last Vitals:  Vitals Value Taken Time  BP    Temp    Pulse 57 01/24/2018 11:48 AM  Resp 15 01/24/2018 11:48 AM  SpO2 100 % 01/24/2018 11:48 AM  Vitals shown include unvalidated device data.  Last Pain:  Vitals:   01/24/18 0749  TempSrc:   PainSc: 0-No pain         Complications: No apparent anesthesia complications

## 2018-01-24 NOTE — Op Note (Signed)
NAME:  [name] NO.: [id number]      MEDICAL RECORD NO.: [id number]      FACILITY:  [facility]      PHYSICIAN:  Mauri Pole  DATE OF BIRTH:  1945/11/09     DATE OF PROCEDURE:  01/24/2018                                 OPERATIVE REPORT         PREOPERATIVE DIAGNOSIS: Right  hip osteoarthritis.      POSTOPERATIVE DIAGNOSIS:  Right hip osteoarthritis.      PROCEDURE:  Right total hip replacement through an anterior approach   utilizing DePuy THR system, component size 43mm pinnacle cup, a size 36+4 neutral   Altrex liner, a size 5 Hi Tri Lock stem with a 36+1.5 delta ceramic   ball.      SURGEON:  Pietro Cassis. Alvan Dame, M.D.      ASSISTANT:  Molli Barrows, PA-C     ANESTHESIA:  Spinal.      SPECIMENS:  None.      COMPLICATIONS:  None.      BLOOD LOSS:  200 cc     DRAINS:  None.      INDICATION OF THE PROCEDURE:  Melissa Clements is a 72 y.o. female who had   presented to office for evaluation of right hip pain.  Radiographs revealed   progressive degenerative changes with bone-on-bone   articulation to the  hip joint.  The patient had painful limited range of   motion significantly affecting their overall quality of life.  The patient was failing to    respond to conservative measures, and at this point was ready   to proceed with more definitive measures.  The patient has noted progressive   degenerative changes in his hip, progressive problems and dysfunction   with regarding the hip prior to surgery.  Consent was obtained for   benefit of pain relief.  Specific risk of infection, DVT, component   failure, dislocation, need for revision surgery, as well discussion of   the anterior versus posterior approach were reviewed.  Consent was   obtained for benefit of anterior pain relief through an anterior   approach.      PROCEDURE IN DETAIL:  The patient was brought to operative theater.   Once adequate anesthesia,  preoperative antibiotics, 2 gm of Ancef, 1 gm of Tranexamic Acid, and 10 mg of Decadron administered.   The patient was positioned supine on the OSI Hanna table.  Once adequate   padding of boney process was carried out, we had predraped out the hip, and  used fluoroscopy to confirm orientation of the pelvis and position.      The right hip was then prepped and draped from proximal iliac crest to   mid thigh with shower curtain technique.      Time-out was performed identifying the patient, planned procedure, and   extremity.     An incision was then made 2 cm distal and lateral to the   anterior superior iliac spine extending over the orientation of the   tensor fascia lata muscle and sharp dissection was carried down to the   fascia of the muscle and protractor placed in the soft tissues.      The  fascia was then incised.  The muscle belly was identified and swept   laterally and retractor placed along the superior neck.  Following   cauterization of the circumflex vessels and removing some pericapsular   fat, a second cobra retractor was placed on the inferior neck.  A third   retractor was placed on the anterior acetabulum after elevating the   anterior rectus.  A L-capsulotomy was along the line of the   superior neck to the trochanteric fossa, then extended proximally and   distally.  Tag sutures were placed and the retractors were then placed   intracapsular.  We then identified the trochanteric fossa and   orientation of my neck cut, confirmed this radiographically   and then made a neck osteotomy with the femur on traction.  The femoral   head was removed without difficulty or complication.  Traction was let   off and retractors were placed posterior and anterior around the   acetabulum.      The labrum and foveal tissue were debrided.  I began reaming with a 22mm   reamer and reamed up to 107mm reamer with good bony bed preparation and a 44mm   cup was chosen.  The final  13mm Pinnacle cup was then impacted under fluoroscopy  to confirm the depth of penetration and orientation with respect to   abduction.  A screw was placed followed by the hole eliminator.  The final   36+4 neutral Altrex liner was impacted with good visualized rim fit.  The cup was positioned anatomically within the acetabular portion of the pelvis.      At this point, the femur was rolled at 80 degrees.  Further capsule was   released off the inferior aspect of the femoral neck.  I then   released the superior capsule proximally.  The hook was placed laterally   along the femur and elevated manually and held in position with the bed   hook.  The leg was then extended and adducted with the leg rolled to 100   degrees of external rotation.  Once the proximal femur was fully   exposed, I used a box osteotome to set orientation.  I then began   broaching with the starting chili pepper broach and passed this by hand and then broached up to 5.  With the 5 broach in place I chose a high offset neck and did several trial reductions.  The offset was appropriate, leg lengths   appeared to be equal best matched with the +1.5 head ball option confirmed radiographically.   Given these findings, I went ahead and dislocated the hip, repositioned all   retractors and positioned the right hip in the extended and abducted position.  The final 5 Hi Tri Lock stem was   chosen and it was impacted down to the level of neck cut.  Based on this   and the trial reduction, a 36+1.5 delta ceramic ball was chosen and   impacted onto a clean and dry trunnion, and the hip was reduced.  The   hip had been irrigated throughout the case again at this point.  I did   reapproximate the superior capsular leaflet to the anterior leaflet   using #1 Vicryl.  The fascia of the   tensor fascia lata muscle was then reapproximated using #1 Vicryl and #0 Stratafix sutures.  The   remaining wound was closed with 2-0 Vicryl and running  4-0 Monocryl.   The hip was  cleaned, dried, and dressed sterilely using Dermabond and   Aquacel dressing.  She was then brought   to recovery room in stable condition tolerating the procedure well.    Molli Barrows, PA-C was present for the entirety of the case involved from   preoperative positioning, perioperative retractor management, general   facilitation of the case, as well as primary wound closure as assistant.            Pietro Cassis Alvan Dame, M.D.        01/24/2018 11:22 AM

## 2018-01-25 DIAGNOSIS — E669 Obesity, unspecified: Secondary | ICD-10-CM | POA: Diagnosis present

## 2018-01-25 HISTORY — DX: Obesity, unspecified: E66.9

## 2018-01-25 LAB — BASIC METABOLIC PANEL
Anion gap: 10 (ref 5–15)
BUN: 32 mg/dL — AB (ref 6–20)
CO2: 19 mmol/L — ABNORMAL LOW (ref 22–32)
Calcium: 8.4 mg/dL — ABNORMAL LOW (ref 8.9–10.3)
Chloride: 105 mmol/L (ref 101–111)
Creatinine, Ser: 1.61 mg/dL — ABNORMAL HIGH (ref 0.44–1.00)
GFR calc Af Amer: 36 mL/min — ABNORMAL LOW (ref >60)
GFR, EST NON AFRICAN AMERICAN: 31 mL/min — AB (ref >60)
GLUCOSE: 125 mg/dL — AB (ref 65–99)
POTASSIUM: 5.3 mmol/L — AB (ref 3.5–5.1)
Sodium: 134 mmol/L — ABNORMAL LOW (ref 135–145)

## 2018-01-25 LAB — CBC
HCT: 30.1 % — ABNORMAL LOW (ref 36.0–46.0)
Hemoglobin: 10.1 g/dL — ABNORMAL LOW (ref 12.0–15.0)
MCH: 30.8 pg (ref 26.0–34.0)
MCHC: 33.6 g/dL (ref 30.0–36.0)
MCV: 91.8 fL (ref 78.0–100.0)
PLATELETS: 138 10*3/uL — AB (ref 150–400)
RBC: 3.28 MIL/uL — ABNORMAL LOW (ref 3.87–5.11)
RDW: 13.6 % (ref 11.5–15.5)
WBC: 9.7 10*3/uL (ref 4.0–10.5)

## 2018-01-25 MED ORDER — FUROSEMIDE 10 MG/ML IJ SOLN
10.0000 mg | Freq: Once | INTRAMUSCULAR | Status: AC
  Administered 2018-01-25: 10 mg via INTRAVENOUS
  Filled 2018-01-25: qty 2

## 2018-01-25 NOTE — Progress Notes (Signed)
patient having some " chest discomfort" patient eating lunch no shortness of breath. Pt states nit could be indigestion Rosario Adie notified with orders received  D Mateo Flow

## 2018-01-25 NOTE — Progress Notes (Signed)
Physical Therapy Treatment Patient Details Name: Melissa Clements MRN: [id number] DOB: Feb 12, 1946 Today's Date: 01/25/2018    History of Present Illness 72 yo female s/p R THA-direct anterior 01/24/18    PT Comments    Received clearance from RN to work with pt. Reviewed/practiced exercises, gait training and stair training. Pt denied any chest pain during session. Hip pain rated 3/10. Reviewed car transfer technique. All education completed. Okay to d/c from PT standpoint-made RN aware.     Follow Up Recommendations  Follow surgeon's recommendation for DC plan and follow-up therapies     Equipment Recommendations  None recommended by PT    Recommendations for Other Services       Precautions / Restrictions Precautions Precautions: Fall Restrictions Weight Bearing Restrictions: No Other Position/Activity Restrictions: WBAT    Mobility  Bed Mobility Overal bed mobility: Needs Assistance Bed Mobility: Supine to Sit;Sit to Supine     Supine to sit: Min assist Sit to supine: Min assist   General bed mobility comments: Assist for R LE.   Transfers Overall transfer level: Needs assistance Equipment used: Rolling walker (2 wheeled) Transfers: Sit to/from Stand Sit to Stand: Min guard         General transfer comment: close guard for safety. VCs safety, hand placement. Increased time.   Ambulation/Gait Ambulation/Gait assistance: Min guard Ambulation Distance (Feet): 115 Feet Assistive device: Rolling walker (2 wheeled) Gait Pattern/deviations: Step-to pattern;Antalgic     General Gait Details: close guard for safety.    Stairs Stairs: Yes Stairs assistance: Min guard Stair Management: Step to pattern;One rail Right Number of Stairs: 2 General stair comments: close guard for safety. VCs safety, sequence. close guard for safety.    Wheelchair Mobility    Modified Rankin (Stroke Patients Only)       Balance                                            Cognition Arousal/Alertness: Awake/alert Behavior During Therapy: WFL for tasks assessed/performed Overall Cognitive Status: Within Functional Limits for tasks assessed                                        Exercises Total Joint Exercises  Heel Slides: AAROM;Right;5 reps;Supine(used sheet to assist) Hip ABduction/ADduction: AAROM;Right;5 reps;Supine(used sheet to assist)     General Comments        Pertinent Vitals/Pain Pain Assessment: 0-10 Pain Score: 3  Pain Location: R hip Pain Descriptors / Indicators: Sore;Discomfort Pain Intervention(s): Monitored during session    Home Living                      Prior Function            PT Goals (current goals can now be found in the care plan section) Progress towards PT goals: Progressing toward goals    Frequency    7X/week      PT Plan Current plan remains appropriate    Co-evaluation              AM-PAC PT "6 Clicks" Daily Activity  Outcome Measure  Difficulty turning over in bed (including adjusting bedclothes, sheets and blankets)?: A Lot Difficulty moving from lying on back to sitting on the side of the bed? :  Unable Difficulty sitting down on and standing up from a chair with arms (e.g., wheelchair, bedside commode, etc,.)?: Unable Help needed moving to and from a bed to chair (including a wheelchair)?: A Little Help needed walking in hospital room?: A Little Help needed climbing 3-5 steps with a railing? : A Little 6 Click Score: 12    End of Session Equipment Utilized During Treatment: Gait belt Activity Tolerance: Patient tolerated treatment well Patient left: in bed;with call bell/phone within reach;with family/visitor present   PT Visit Diagnosis: Muscle weakness (generalized) (M62.81);Difficulty in walking, not elsewhere classified (R26.2);Pain Pain - Right/Left: Right Pain - part of body: Hip     Time: 1505-6979 PT Time Calculation (min)  (ACUTE ONLY): 14 min  Charges:  $Gait Training: 8-22 mins                    G Codes:         Weston Anna, MPT Pager: [phone]

## 2018-01-25 NOTE — Progress Notes (Signed)
Physical Therapy Treatment Patient Details Name: ORENA CAVAZOS MRN: [id number] DOB: [birth year] Today's Date: 01/25/2018    History of Present Illness 72 yo female s/p R THA-direct anterior 01/24/18    PT Comments    Progressing with mobility. Pt stated she was told she would have 2 sessions so will plan to have 2nd session to practice stair negotiation prior to d/c. Issued HEP handout.    Follow Up Recommendations  Follow surgeon's recommendation for DC plan and follow-up therapies     Equipment Recommendations  None recommended by PT    Recommendations for Other Services       Precautions / Restrictions Precautions Precautions: Fall Restrictions Weight Bearing Restrictions: No Other Position/Activity Restrictions: WBAT    Mobility  Bed Mobility               General bed mobility comments: oob in recliner  Transfers Overall transfer level: Needs assistance Equipment used: Rolling walker (2 wheeled) Transfers: Sit to/from Stand Sit to Stand: Min guard         General transfer comment: close guard for safety. VCs safety, hand placement. Increased time.   Ambulation/Gait Ambulation/Gait assistance: Min guard Ambulation Distance (Feet): 115 Feet Assistive device: Rolling walker (2 wheeled) Gait Pattern/deviations: Step-to pattern;Antalgic     General Gait Details: close guard for safety.    Stairs             Wheelchair Mobility    Modified Rankin (Stroke Patients Only)       Balance                                            Cognition Arousal/Alertness: Awake/alert Behavior During Therapy: WFL for tasks assessed/performed Overall Cognitive Status: Within Functional Limits for tasks assessed                                        Exercises Total Joint Exercises Ankle Circles/Pumps: AROM;Both;10 reps;Supine Quad Sets: AROM;Both;10 reps;Supine Heel Slides: AAROM;Right;10 reps;Supine Hip  ABduction/ADduction: AAROM;Right;10 reps;Supine Long Arc Quad: AROM;Right;10 reps;Seated Knee Flexion: Right;10 reps;Standing;AROM Marching in Standing: AROM;Both;10 reps;Standing General Exercises - Lower Extremity Heel Raises: AROM;Both;10 reps;Standing    General Comments        Pertinent Vitals/Pain Pain Assessment: 0-10 Pain Score: 7  Pain Location: R hip Pain Descriptors / Indicators: Sore;Aching;Discomfort Pain Intervention(s): Monitored during session;Repositioned;Ice applied    Home Living                      Prior Function            PT Goals (current goals can now be found in the care plan section) Progress towards PT goals: Progressing toward goals    Frequency    7X/week      PT Plan Current plan remains appropriate    Co-evaluation              AM-PAC PT "6 Clicks" Daily Activity  Outcome Measure  Difficulty turning over in bed (including adjusting bedclothes, sheets and blankets)?: A Lot Difficulty moving from lying on back to sitting on the side of the bed? : A Lot Difficulty sitting down on and standing up from a chair with arms (e.g., wheelchair, bedside commode, etc,.)?: Unable Help needed moving to and  from a bed to chair (including a wheelchair)?: A Little Help needed walking in hospital room?: A Little Help needed climbing 3-5 steps with a railing? : A Lot 6 Click Score: 13    End of Session Equipment Utilized During Treatment: Gait belt Activity Tolerance: Patient tolerated treatment well Patient left: in chair;with call bell/phone within reach;with family/visitor present   PT Visit Diagnosis: Muscle weakness (generalized) (M62.81);Difficulty in walking, not elsewhere classified (R26.2);Pain Pain - Right/Left: Right Pain - part of body: Hip     Time: 8185-6314 PT Time Calculation (min) (ACUTE ONLY): 16 min  Charges:  $Gait Training: 8-22 mins                    G Codes:          Weston Anna, MPT Pager:  [phone]

## 2018-01-25 NOTE — Progress Notes (Signed)
PT Cancellation Note  Patient Details Name: Melissa Clements MRN: [id number] DOB: [birth year]   Cancelled Treatment:    Reason Eval/Treat Not Completed: Pt c/o chest pain. RN currently managing. Will hold PT for now and check back a little later.    Weston Anna, MPT Pager: [phone]

## 2018-01-25 NOTE — Progress Notes (Signed)
     Subjective: 1 Day Post-Op Procedure(s) (LRB): RIGHT TOTAL HIP ARTHROPLASTY ANTERIOR APPROACH (Right)   Patient reports pain as mild, pain controlled.  No events throughout the night.  Discussed the patient's renal function which was elevated pre-op and for her to follow up with her PCP.  Discussed working with PT.  Ready to be discharged home if she does well with PT.  Objective:   VITALS:   Vitals:   01/25/18 0032 01/25/18 0523  BP: 135/64 (!) 146/56  Pulse: 62 60  Resp: 18 17  Temp: 97.8 F (36.6 C) 98.1 F (36.7 C)  SpO2: 99% 98%    Dorsiflexion/Plantar flexion intact Incision: dressing C/D/I No cellulitis present Compartment soft  LABS Recent Labs    01/25/18 0553  HGB 10.1*  HCT 30.1*  WBC 9.7  PLT 138*    Recent Labs    01/24/18 0750 01/25/18 0553  NA 141 134*  K 4.7 5.3*  BUN 33* 32*  CREATININE 1.43* 1.61*  GLUCOSE 97 125*     Assessment/Plan: 1 Day Post-Op Procedure(s) (LRB): RIGHT TOTAL HIP ARTHROPLASTY ANTERIOR APPROACH (Right) Foley cath d/c'ed Advance diet Up with therapy D/C IV fluids Discharge home Follow up in 2 weeks at [facility]. Follow up with OLIN,Kiaira Pointer D in 2 weeks.  Contact information:  [facility] [address] [place] [phone]    Obese (BMI 30-39.9) Estimated body mass index is 37.86 kg/m as calculated from the following:   Height as of this encounter: 5' 3.5" (1.613 m).   Weight as of this encounter: 98.5 kg (217 lb 1.9 oz). Patient also counseled that weight may inhibit the healing process Patient counseled that losing weight will help with future health issues        West Pugh. Jaye Polidori   PAC  01/25/2018, 7:56 AM

## 2018-01-30 NOTE — Discharge Summary (Signed)
Physician Discharge Summary  Patient ID: Melissa Clements MRN: [id number] DOB/AGE: 72/16/1947 72 y.o.  Admit date: 01/24/2018 Discharge date: 01/25/2018   Procedures:  Procedure(s) (LRB): RIGHT TOTAL HIP ARTHROPLASTY ANTERIOR APPROACH (Right)  Attending Physician:  Dr. Paralee Cancel   Admission Diagnoses:   Right hip primary OA / pain  Discharge Diagnoses:  Principal Problem:   S/P right THA, AA Active Problems:   Obese  Past Medical History:  Diagnosis Date  . Arthritis    end stage right hip  . Basal cell carcinoma   . Chronic kidney disease    stage III, related to high blood pressure medication  . Colon polyp   . Depression   . History of chest pain   . History of dizziness   . History of palpitations   . Hyperlipidemia   . Hypertension     HPI:    Melissa Clements, 72 y.o. female, has a history of pain and functional disability in the right hip(s) due to arthritis and patient has failed non-surgical conservative treatments for greater than 12 weeks to include NSAID's and/or analgesics, corticosteriod injections, use of assistive devices and activity modification.  Onset of symptoms was gradual starting ~4 years ago with gradually worsening course since that time.The patient noted no past surgery on the right hip(s).  Patient currently rates pain in the right hip at 7 out of 10 with activity. Patient has worsening of pain with activity and weight bearing, trendelenberg gait, pain that interfers with activities of daily living and pain with passive range of motion. Patient has evidence of periarticular osteophytes and joint space narrowing by imaging studies. This condition presents safety issues increasing the risk of falls. There is no current active infection.  Risks, benefits and expectations were discussed with the patient.  Risks including but not limited to the risk of anesthesia, blood clots, nerve damage, blood vessel damage, failure of the prosthesis, infection and up  to and including death.  Patient understand the risks, benefits and expectations and wishes to proceed with surgery.  PCP: [name], MD   Discharged Condition: good  Hospital Course:  Patient underwent the above stated procedure on 01/24/2018. Patient tolerated the procedure well and brought to the recovery room in good condition and subsequently to the floor.  POD #1 BP: 146/56 ; Pulse: 60 ; Temp: 98.1 F (36.7 C) ; Resp: 17 Patient reports pain as mild, pain controlled.  No events throughout the night.  Discussed the patient's renal function which was elevated pre-op and for her to follow up with her PCP.  Discussed working with PT.  Ready to be discharged home. Dorsiflexion/plantar flexion intact, incision: dressing C/D/I, no cellulitis present and compartment soft.   LABS  Basename    HGB     10.1  HCT     30.1    Discharge Exam: General appearance: alert, cooperative and no distress Extremities: Homans sign is negative, no sign of DVT, no edema, redness or tenderness in the calves or thighs and no ulcers, gangrene or trophic changes  Disposition:  Home with follow up in 2 weeks   Follow-up Information    Paralee Cancel, MD. Schedule an appointment as soon as possible for a visit in 2 weeks.   Specialty:  Orthopedic Surgery Contact information: [address] [place] [address] [phone]           Discharge Instructions    Call MD / Call 911   Complete by:  As directed    If you experience chest pain or shortness of breath, CALL 911 and be transported to the hospital emergency room.  If you develope a fever above 101 F, pus (white drainage) or increased drainage or redness at the wound, or calf pain, call your surgeon's office.   Change dressing   Complete by:  As directed    Maintain surgical dressing until follow up in the clinic. If the edges start to pull up, may reinforce with tape. If the dressing is no longer working, may remove and  cover with gauze and tape, but must keep the area dry and clean.  Call with any questions or concerns.   Constipation Prevention   Complete by:  As directed    Drink plenty of fluids.  Prune juice may be helpful.  You may use a stool softener, such as Colace (over the counter) 100 mg twice a day.  Use MiraLax (over the counter) for constipation as needed.   Diet - low sodium heart healthy   Complete by:  As directed    Discharge instructions   Complete by:  As directed    Maintain surgical dressing until follow up in the clinic. If the edges start to pull up, may reinforce with tape. If the dressing is no longer working, may remove and cover with gauze and tape, but must keep the area dry and clean.  Follow up in 2 weeks at [facility]. Call with any questions or concerns.   Increase activity slowly as tolerated   Complete by:  As directed    Weight bearing as tolerated with assist device (walker, cane, etc) as directed, use it as long as suggested by your surgeon or therapist, typically at least 4-6 weeks.   TED hose   Complete by:  As directed    Use stockings (TED hose) for 2 weeks on both leg(s).  You may remove them at night for sleeping.      Allergies as of 01/25/2018   No Known Allergies     Medication List    STOP taking these medications   aspirin 81 MG tablet Replaced by:  aspirin 81 MG chewable tablet     TAKE these medications   amLODipine 2.5 MG tablet Commonly known as:  NORVASC Take 2.5 mg by mouth daily.   aspirin 81 MG chewable tablet Commonly known as:  ASPIRIN CHILDRENS Chew 1 tablet (81 mg total) by mouth 2 (two) times daily. Take for 4 weeks, then resume regular dose. Replaces:  aspirin 81 MG tablet   docusate sodium 100 MG capsule Commonly known as:  COLACE Take 1 capsule (100 mg total) by mouth 2 (two) times daily.   ferrous sulfate 325 (65 FE) MG tablet Commonly known as:  FERROUSUL Take 1 tablet (325 mg total) by mouth 3 (three) times  daily with meals.   HYDROcodone-acetaminophen 7.5-325 MG tablet Commonly known as:  NORCO Take 1-2 tablets by mouth every 4 (four) hours as needed for moderate pain.   lisinopril 20 MG tablet Commonly known as:  PRINIVIL,ZESTRIL Take 20 mg by mouth daily.   methocarbamol 500 MG tablet Commonly known as:  ROBAXIN Take 1 tablet (500 mg total) by mouth every 6 (six) hours as needed for muscle spasms.   metoprolol succinate 50 MG 24 hr tablet Commonly known as:  TOPROL-XL Take 12.5 mg by mouth daily.   OSTEO BI-FLEX REGULAR STRENGTH PO Take 2 tablets by mouth daily.   polyethylene glycol packet Commonly  known as:  MIRALAX / GLYCOLAX Take 17 g by mouth 2 (two) times daily.   simvastatin 40 MG tablet Commonly known as:  ZOCOR Take 40 mg by mouth daily.   Vitamin D 2000 units tablet Take 6,000 Units by mouth daily.            Discharge Care Instructions  (From admission, onward)        Start     Ordered   01/25/18 0000  Change dressing    Comments:  Maintain surgical dressing until follow up in the clinic. If the edges start to pull up, may reinforce with tape. If the dressing is no longer working, may remove and cover with gauze and tape, but must keep the area dry and clean.  Call with any questions or concerns.   01/25/18 0804       Signed: West Pugh. Tatsuya Okray   PA-C  01/30/2018, 12:46 PM

## 2018-03-23 ENCOUNTER — Encounter: Payer: Self-pay | Admitting: Cardiology

## 2018-03-23 ENCOUNTER — Ambulatory Visit: Payer: Medicare Other | Admitting: Cardiology

## 2018-03-23 VITALS — BP 132/76 | HR 64 | Ht 63.5 in | Wt 223.8 lb

## 2018-03-23 DIAGNOSIS — I10 Essential (primary) hypertension: Secondary | ICD-10-CM | POA: Diagnosis not present

## 2018-03-23 DIAGNOSIS — R072 Precordial pain: Secondary | ICD-10-CM

## 2018-03-23 DIAGNOSIS — R001 Bradycardia, unspecified: Secondary | ICD-10-CM | POA: Diagnosis not present

## 2018-03-23 DIAGNOSIS — R002 Palpitations: Secondary | ICD-10-CM | POA: Diagnosis not present

## 2018-03-23 NOTE — Progress Notes (Signed)
Cardiology Office Note:    Date:  03/23/2018   ID:  ADONAI HELZER, DOB 1946-05-11, MRN [id number]  PCP:  [name], MD  Cardiologist:  Jenne Campus, MD    Referring MD: [name], MD   Chief Complaint  Patient presents with  . 2 month follow up  Doing well  History of Present Illness:    Melissa Clements is a 72 y.o. female with sinus bradycardia, dictations, essential hypertension.  She comes to our office for follow-up time I saw her she was before hip surgery 8 weeks ago she had her hip surgery done everything went well she is doing very well after that.  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  No chest pain tightness squeezing pressure burning chest no dizziness no palpitations.  No complications of surgery.  Past Medical History:  Diagnosis Date  . Arthritis    end stage right hip  . Basal cell carcinoma   . Chronic kidney disease    stage III, related to high blood pressure medication  . Colon polyp   . Depression   . History of chest pain   . History of dizziness   . History of palpitations   . Hyperlipidemia   . Hypertension     Past Surgical History:  Procedure Laterality Date  . CATARACT EXTRACTION, BILATERAL    . COLONOSCOPY    . DIAGNOSTIC LAPAROSCOPY    . DILATION AND CURETTAGE OF UTERUS    . TOTAL HIP ARTHROPLASTY Right 01/24/2018   Procedure: RIGHT TOTAL HIP ARTHROPLASTY ANTERIOR APPROACH;  Surgeon: Paralee Cancel, MD;  Location: [facility];  Service: Orthopedics;  Laterality: Right;  70 mins    Current Medications: Current Meds  Medication Sig  . amLODipine (NORVASC) 2.5 MG tablet Take 2.5 mg by mouth daily.  . Cholecalciferol (VITAMIN D) 2000 units tablet Take 6,000 Units by mouth daily.  Marland Kitchen docusate sodium (COLACE) 100 MG capsule Take 1 capsule (100 mg total) by mouth 2 (two) times daily.  . Glucosamine-Chondroitin (OSTEO BI-FLEX REGULAR STRENGTH PO) Take 2 tablets by mouth daily.  . metoprolol succinate  (TOPROL-XL) 50 MG 24 hr tablet Take 12.5 mg by mouth daily.   . simvastatin (ZOCOR) 40 MG tablet Take 40 mg by mouth daily.     Allergies:   Patient has no known allergies.   Social History   Socioeconomic History  . Marital status: Married    Spouse name: [name]  . Number of children: 1  . Years of education: college  . Highest education level: Not on file  Occupational History  . Occupation: retired  Scientific laboratory technician  . Financial resource strain: Not on file  . Food insecurity:    Worry: Not on file    Inability: Not on file  . Transportation needs:    Medical: Not on file    Non-medical: Not on file  Tobacco Use  . Smoking status: Never Smoker  . Smokeless tobacco: Never Used  Substance and Sexual Activity  . Alcohol use: No    Alcohol/week: 0.0 oz  . Drug use: No  . Sexual activity: Not on file  Lifestyle  . Physical activity:    Days per week: Not on file    Minutes per session: Not on file  . Stress: Not on file  Relationships  . Social connections:    Talks on phone: Not on file    Gets together: Not on file    Attends  religious service: Not on file    Active member of club or organization: Not on file    Attends meetings of clubs or organizations: Not on file    Relationship status: Not on file  Other Topics Concern  . Not on file  Social History Narrative  . Not on file     Family History: The patient's family history includes Alzheimer's disease in her brother and mother; Diabetes in her mother; Heart attack (age of onset: 74) in her father; Heart disease in her father. ROS:   Please see the history of present illness.    All 14 point review of systems negative except as described per history of present illness  EKGs/Labs/Other Studies Reviewed:      Recent Labs: 01/25/2018: BUN 32; Creatinine, Ser 1.61; Hemoglobin 10.1; Platelets 138; Potassium 5.3; Sodium 134  Recent Lipid Panel No results found for: CHOL, TRIG, HDL, CHOLHDL, VLDL, LDLCALC,  LDLDIRECT  Physical Exam:    VS:  BP 132/76   Pulse 64   Ht 5' 3.5" (1.613 m)   Wt 223 lb 12.8 oz (101.5 kg)   SpO2 98%   BMI 39.02 kg/m     Wt Readings from Last 3 Encounters:  03/23/18 223 lb 12.8 oz (101.5 kg)  01/24/18 217 lb 1.9 oz (98.5 kg)  01/20/18 217 lb 1.9 oz (98.5 kg)     GEN:  Well nourished, well developed in no acute distress HEENT: Normal NECK: No JVD; No carotid bruits LYMPHATICS: No lymphadenopathy CARDIAC: RRR, no murmurs, no rubs, no gallops RESPIRATORY:  Clear to auscultation without rales, wheezing or rhonchi  ABDOMEN: Soft, non-tender, non-distended MUSCULOSKELETAL:  No edema; No deformity  SKIN: Warm and dry LOWER EXTREMITIES: no swelling NEUROLOGIC:  Alert and oriented x 3 PSYCHIATRIC:  Normal affect   ASSESSMENT:    1. Sinus bradycardia   2. Benign essential HTN   3. Heart palpitations   4. Precordial pain    PLAN:    In order of problems listed above:  1. Sinus bradycardia: Asymptomatic.  Not critical.  A small dose of beta-blocker which I will continue. 2. Essential hypertension: Her lisinopril had to be withdrawn because of low blood pressure while in the hospital now blood pressure seems to be welcome 3. Heart palpitations: Denies having any 4. Precordial chest pain denies having any.  She is doing great I see her back in 6 months or sooner if she has a problem   Medication Adjustments/Labs and Tests Ordered: Current medicines are reviewed at length with the patient today.  Concerns regarding medicines are outlined above.  No orders of the defined types were placed in this encounter.  Medication changes: No orders of the defined types were placed in this encounter.   Signed, Park Liter, MD, [facility] 03/23/2018 9:28 AM    [place]

## 2018-03-23 NOTE — Patient Instructions (Signed)
Medication Instructions:  Your physician recommends that you continue on your current medications as directed. Please refer to the Current Medication list given to you today.   Labwork: None  Testing/Procedures: None  Follow-Up: Your physician wants you to follow-up in: 6 months. You will receive a reminder letter in the mail two months in advance. If you don't receive a letter, please call our office to schedule the follow-up appointment.   If you need a refill on your cardiac medications before your next appointment, please call your pharmacy.   Thank you for choosing [facility]! Margarette Vannatter, RN [phone]    

## 2018-09-04 ENCOUNTER — Telehealth: Payer: Self-pay | Admitting: Cardiology

## 2018-09-26 NOTE — Telephone Encounter (Signed)
Done

## 2018-10-19 ENCOUNTER — Encounter: Payer: Self-pay | Admitting: Cardiology

## 2018-10-19 ENCOUNTER — Ambulatory Visit (INDEPENDENT_AMBULATORY_CARE_PROVIDER_SITE_OTHER): Payer: Medicare Other | Admitting: Cardiology

## 2018-10-19 VITALS — BP 134/72 | HR 64 | Ht 63.5 in | Wt 235.0 lb

## 2018-10-19 DIAGNOSIS — R001 Bradycardia, unspecified: Secondary | ICD-10-CM | POA: Diagnosis not present

## 2018-10-19 DIAGNOSIS — I10 Essential (primary) hypertension: Secondary | ICD-10-CM | POA: Diagnosis not present

## 2018-10-19 DIAGNOSIS — R002 Palpitations: Secondary | ICD-10-CM

## 2018-10-19 NOTE — Addendum Note (Signed)
Addended by: Linna Hoff R on: 10/19/2018 11:02 AM   Modules accepted: Orders

## 2018-10-19 NOTE — Progress Notes (Signed)
Cardiology Office Note:    Date:  10/19/2018   ID:  Melissa Clements, DOB [birth year], MRN [id number]  PCP:  [name], MD  Cardiologist:  Jenne Campus, MD    Referring MD: [name], MD   Chief Complaint  Patient presents with  . Follow-up  Doing well  History of Present Illness:    Melissa Clements is a 73 y.o. female with bradycardia.  On very small dose of beta-blocker to control her palpitations overall doing well no chest pain tightness squeezing pressure burning chest.  She complained of the fibers to get some back problem as well as he problems that prevent her from being more mobile.  Past Medical History:  Diagnosis Date  . Arthritis    end stage right hip  . Basal cell carcinoma   . Chronic kidney disease    stage III, related to high blood pressure medication  . Colon polyp   . Depression   . History of chest pain   . History of dizziness   . History of palpitations   . Hyperlipidemia   . Hypertension     Past Surgical History:  Procedure Laterality Date  . CATARACT EXTRACTION, BILATERAL    . COLONOSCOPY    . DIAGNOSTIC LAPAROSCOPY    . DILATION AND CURETTAGE OF UTERUS    . TOTAL HIP ARTHROPLASTY Right 01/24/2018   Procedure: RIGHT TOTAL HIP ARTHROPLASTY ANTERIOR APPROACH;  Surgeon: Paralee Cancel, MD;  Location: [facility];  Service: Orthopedics;  Laterality: Right;  70 mins    Current Medications: Current Meds  Medication Sig  . amLODipine (NORVASC) 2.5 MG tablet Take 2.5 mg by mouth daily.  . Cholecalciferol (VITAMIN D) 2000 units tablet Take 6,000 Units by mouth daily.  . Collagen-Boron-Hyaluronic Acid ([employer] JO[employer]ALTH TRIPLE ACTION PO) Take 1 capsule by mouth daily.  Marland Kitchen docusate sodium (COLACE) 100 MG capsule Take 1 capsule (100 mg total) by mouth 2 (two) times daily. (Patient taking differently: Take 100 mg by mouth daily. )  . metoprolol succinate (TOPROL-XL) 50 MG 24 hr tablet Take 12.5 mg by mouth daily.   . simvastatin (ZOCOR) 40 MG  tablet Take 40 mg by mouth daily.     Allergies:   Patient has no known allergies.   Social History   Socioeconomic History  . Marital status: Married    Spouse name: [name]  . Number of children: 1  . Years of education: college  . Highest education level: Not on file  Occupational History  . Occupation: retired  Scientific laboratory technician  . Financial resource strain: Not on file  . Food insecurity:    Worry: Not on file    Inability: Not on file  . Transportation needs:    Medical: Not on file    Non-medical: Not on file  Tobacco Use  . Smoking status: Never Smoker  . Smokeless tobacco: Never Used  Substance and Sexual Activity  . Alcohol use: No    Alcohol/week: 0.0 standard drinks  . Drug use: No  . Sexual activity: Not on file  Lifestyle  . Physical activity:    Days per week: Not on file    Minutes per session: Not on file  . Stress: Not on file  Relationships  . Social connections:    Talks on phone: Not on file    Gets together: Not on file    Attends religious service: Not on file    Active member of club or organization: Not on file  Attends meetings of clubs or organizations: Not on file    Relationship status: Not on file  Other Topics Concern  . Not on file  Social History Narrative  . Not on file     Family History: The patient's family history includes Alzheimer's disease in her brother and mother; Diabetes in her mother; Heart attack (age of onset: 47) in her father; Heart disease in her father. ROS:   Please see the history of present illness.    All 14 point review of systems negative except as described per history of present illness  EKGs/Labs/Other Studies Reviewed:      Recent Labs: 01/25/2018: BUN 32; Creatinine, Ser 1.61; Hemoglobin 10.1; Platelets 138; Potassium 5.3; Sodium 134  Recent Lipid Panel No results found for: CHOL, TRIG, HDL, CHOLHDL, VLDL, LDLCALC, LDLDIRECT  Physical Exam:    VS:  BP 134/72   Pulse 64   Ht 5' 3.5" (1.613 m)    Wt 235 lb (106.6 kg)   SpO2 97%   BMI 40.98 kg/m     Wt Readings from Last 3 Encounters:  10/19/18 235 lb (106.6 kg)  03/23/18 223 lb 12.8 oz (101.5 kg)  01/24/18 217 lb 1.9 oz (98.5 kg)     GEN:  Well nourished, well developed in no acute distress HEENT: Normal NECK: No JVD; No carotid bruits LYMPHATICS: No lymphadenopathy CARDIAC: RRR, no murmurs, no rubs, no gallops RESPIRATORY:  Clear to auscultation without rales, wheezing or rhonchi  ABDOMEN: Soft, non-tender, non-distended MUSCULOSKELETAL:  No edema; No deformity  SKIN: Warm and dry LOWER EXTREMITIES: no swelling NEUROLOGIC:  Alert and oriented x 3 PSYCHIATRIC:  Normal affect   ASSESSMENT:    1. Sinus bradycardia   2. Benign essential HTN   3. Heart palpitations    PLAN:    In order of problems listed above:  1. Sinus bradycardia ask her to wear 24 hours monitor to make sure there is no critical bradycardia however likely she is asymptomatic 2. Benign essential hypertension blood pressure well controlled continue present management. 3. Heart palpitations denies having any   Medication Adjustments/Labs and Tests Ordered: Current medicines are reviewed at length with the patient today.  Concerns regarding medicines are outlined above.  No orders of the defined types were placed in this encounter.  Medication changes: No orders of the defined types were placed in this encounter.   Signed, Park Liter, MD, [facility] 10/19/2018 10:56 AM    [place]

## 2018-10-19 NOTE — Patient Instructions (Signed)
Medication Instructions:  Your physician recommends that you continue on your current medications as directed. Please refer to the Current Medication list given to you today.  If you need a refill on your cardiac medications before your next appointment, please call your pharmacy.   Lab work: None.  If you have labs (blood work) drawn today and your tests are completely normal, you will receive your results only by: Marland Kitchen MyChart Message (if you have MyChart) OR . A paper copy in the mail If you have any lab test that is abnormal or we need to change your treatment, we will call you to review the results.  Testing/Procedures: Your physician has recommended that you wear a holter monitor. Holter monitors are medical devices that record the heart's electrical activity. Doctors most often use these monitors to diagnose arrhythmias. Arrhythmias are problems with the speed or rhythm of the heartbeat. The monitor is a small, portable device. You can wear one while you do your normal daily activities. This is usually used to diagnose what is causing palpitations/syncope (passing out). Wear for 24 hours     Follow-Up: At [facility], you and your health needs are our priority.  As part of our continuing mission to provide you with exceptional heart care, we have created designated Provider Care Teams.  These Care Teams include your primary Cardiologist (physician) and Advanced Practice Providers (APPs -  Physician Assistants and Nurse Practitioners) who all work together to provide you with the care you need, when you need it. You will need a follow up appointment in 6 months.  Please call our office 2 months in advance to schedule this appointment.  You may see No primary care provider on file. or another member of our [employer] Provider Team in [place]: Shirlee More, MD . Jyl Heinz, MD  Any Other Special Instructions Will Be Listed Below (If Applicable).   Ambulatory Cardiac  Monitoring An ambulatory cardiac monitor is a small recording device that is used to detect abnormal heart rhythms (arrhythmias). Most monitors are connected by wires to flat, sticky disks (electrodes) that are then attached to your chest. You may need to wear a monitor if you have had symptoms such as:  Fast heartbeats (palpitations).  Dizziness.  Fainting or light-headedness.  Unexplained weakness.  Shortness of breath. There are several types of monitors. Some common monitors include:  Holter monitor. This records your heart rhythm continuously, usually for 24-48 hours.  Event (episodic) monitor. This monitor has a symptoms button, and when pushed, it will begin recording. You need to activate this monitor to record when you have a heart-related symptom.  Automatic detection monitor. This monitor will begin recording when it detects an abnormal heartbeat. What are the risks? Generally, these devices are safe to use. However, it is possible that the skin under the electrodes will become irritated. How to prepare for monitoring Your health care provider will prepare your chest for the electrode placement and show you how to use the monitor.  Do not apply lotions to your chest before monitoring.  Follow directions on how to care for the monitor, and how to return the monitor when the testing period is complete. How to use your cardiac monitor  Follow directions about how long to wear the monitor, and if you can take the monitor off in order to shower or bathe. ? Do not let the monitor get wet. ? Do not bathe, swim, or use a hot tub while wearing the monitor.  Keep your skin clean. Do not put body lotion or moisturizer on your chest.  Change the electrodes as told by your health care provider, or any time they stop sticking to your skin. You may need to use medical tape to keep them on.  Try to put the electrodes in slightly different places on your chest to help prevent skin  irritation. Follow directions from your health care provider about where to place the electrodes.  Make sure the monitor is safely clipped to your clothing or in a location close to your body as recommended by your health care provider.  If your monitor has a symptoms button, press the button to mark an event as soon as you feel a heart-related symptom, such as: ? Dizziness. ? Weakness. ? Light-headedness. ? Palpitations. ? Thumping or pounding in your chest. ? Shortness of breath. ? Unexplained weakness.  Keep a diary of your activities, such as walking, doing chores, and taking medicine. It is very important to note what you were doing when you pushed the button to record your symptoms. This will help your health care provider determine what might be contributing to your symptoms.  Send the recorded information as recommended by your health care provider. It may take some time for your health care provider to process the results.  Change the batteries as told by your health care provider.  Keep electronic devices away from your monitor. These include: ? Tablets. ? MP3 players. ? Cell phones.  While wearing your monitor you should avoid: ? Electric blankets. ? Armed forces operational officer. ? Electric toothbrushes. ? Microwave ovens. ? Magnets. ? Metal detectors. Get help right away if:  You have chest pain.  You have shortness of breath or extreme difficulty breathing.  You develop a very fast heartbeat that does not get better.  You develop dizziness that does not go away.  You faint or constantly feel like you are about to faint. Summary  An ambulatory cardiac monitor is a small recording device that is used to detect abnormal heart rhythms (arrhythmias).  Make sure you understand how to send the information from the monitor to your health care provider.  It is important to press the button on the monitor when you have any heart-related symptoms.  Keep a diary of your  activities, such as walking, doing chores, and taking medicine. It is very important to note what you were doing when you pushed the button to record your symptoms. This will help your health care provider learn what might be causing your symptoms. This information is not intended to replace advice given to you by your health care provider. Make sure you discuss any questions you have with your health care provider. Document Released: 06/29/2008 Document Revised: 07/06/2017 Document Reviewed: 09/04/2016 Elsevier Interactive Patient Education  2019 [employer].

## 2018-10-26 ENCOUNTER — Ambulatory Visit (INDEPENDENT_AMBULATORY_CARE_PROVIDER_SITE_OTHER): Payer: Medicare Other

## 2018-10-26 DIAGNOSIS — R002 Palpitations: Secondary | ICD-10-CM

## 2019-04-12 ENCOUNTER — Other Ambulatory Visit: Payer: Self-pay

## 2019-04-12 ENCOUNTER — Encounter: Payer: Self-pay | Admitting: Cardiology

## 2019-04-12 ENCOUNTER — Ambulatory Visit (INDEPENDENT_AMBULATORY_CARE_PROVIDER_SITE_OTHER): Payer: Medicare Other | Admitting: Cardiology

## 2019-04-12 VITALS — BP 116/68 | HR 57 | Wt 237.0 lb

## 2019-04-12 DIAGNOSIS — R072 Precordial pain: Secondary | ICD-10-CM | POA: Diagnosis not present

## 2019-04-12 DIAGNOSIS — R001 Bradycardia, unspecified: Secondary | ICD-10-CM | POA: Diagnosis not present

## 2019-04-12 DIAGNOSIS — I10 Essential (primary) hypertension: Secondary | ICD-10-CM

## 2019-04-12 NOTE — Progress Notes (Signed)
Cardiology Office Note:    Date:  04/12/2019   ID:  Melissa Clements, DOB [birth year], MRN [id number]  PCP:  [name], MD  Cardiologist:  Jenne Campus, MD    Referring MD: [name], MD   Chief Complaint  Patient presents with  . Follow-up  Doing well  History of Present Illness:    Melissa Clements is a 73 y.o. female past medical history significant for bradycardia, essential hypertension, she had a hip surgery done and she was sent to me initially for evaluation before the surgery.  She was very active and there was no problem with her having surgery she went to do surgery without difficulties.  Now she is recovering from it and doing well disappointed with the fact that coronavirus situation exists and she cannot exercise as much as she wants to.  Past Medical History:  Diagnosis Date  . Arthritis    end stage right hip  . Basal cell carcinoma   . Chronic kidney disease    stage III, related to high blood pressure medication  . Colon polyp   . Depression   . History of chest pain   . History of dizziness   . History of palpitations   . Hyperlipidemia   . Hypertension     Past Surgical History:  Procedure Laterality Date  . CATARACT EXTRACTION, BILATERAL    . COLONOSCOPY    . DIAGNOSTIC LAPAROSCOPY    . DILATION AND CURETTAGE OF UTERUS    . TOTAL HIP ARTHROPLASTY Right 01/24/2018   Procedure: RIGHT TOTAL HIP ARTHROPLASTY ANTERIOR APPROACH;  Surgeon: Paralee Cancel, MD;  Location: [facility];  Service: Orthopedics;  Laterality: Right;  70 mins    Current Medications: Current Meds  Medication Sig  . amLODipine (NORVASC) 2.5 MG tablet Take 2.5 mg by mouth daily.  . Cholecalciferol (VITAMIN D) 2000 units tablet Take 6,000 Units by mouth daily.  . Collagen-Boron-Hyaluronic Acid ([employer] JOINT HEALTH TRIPLE ACTION PO) Take 1 capsule by mouth daily.  Marland Kitchen docusate sodium (COLACE) 100 MG capsule Take 1 capsule (100 mg total) by mouth 2 (two) times daily. (Patient taking  differently: Take 100 mg by mouth daily. )  . metoprolol succinate (TOPROL-XL) 50 MG 24 hr tablet Take 12.5 mg by mouth daily.   . simvastatin (ZOCOR) 40 MG tablet Take 40 mg by mouth daily.     Allergies:   Patient has no known allergies.   Social History   Socioeconomic History  . Marital status: Married    Spouse name: [name]  . Number of children: 1  . Years of education: college  . Highest education level: Not on file  Occupational History  . Occupation: retired  Scientific laboratory technician  . Financial resource strain: Not on file  . Food insecurity    Worry: Not on file    Inability: Not on file  . Transportation needs    Medical: Not on file    Non-medical: Not on file  Tobacco Use  . Smoking status: Never Smoker  . Smokeless tobacco: Never Used  Substance and Sexual Activity  . Alcohol use: No    Alcohol/week: 0.0 standard drinks  . Drug use: No  . Sexual activity: Not on file  Lifestyle  . Physical activity    Days per week: Not on file    Minutes per session: Not on file  . Stress: Not on file  Relationships  . Social connections    Talks on phone: Not on file  Gets together: Not on file    Attends religious service: Not on file    Active member of club or organization: Not on file    Attends meetings of clubs or organizations: Not on file    Relationship status: Not on file  Other Topics Concern  . Not on file  Social History Narrative  . Not on file     Family History: The patient's family history includes Alzheimer's disease in her brother and mother; Diabetes in her mother; Heart attack (age of onset: 55) in her father; Heart disease in her father. ROS:   Please see the history of present illness.    All 14 point review of systems negative except as described per history of present illness  EKGs/Labs/Other Studies Reviewed:      Recent Labs: No results found for requested labs within last 8760 hours.  Recent Lipid Panel No results found for: CHOL,  TRIG, HDL, CHOLHDL, VLDL, LDLCALC, LDLDIRECT  Physical Exam:    VS:  BP 116/68   Pulse (!) 57   Wt 237 lb (107.5 kg)   SpO2 98%   BMI 41.32 kg/m     Wt Readings from Last 3 Encounters:  04/12/19 237 lb (107.5 kg)  10/19/18 235 lb (106.6 kg)  03/23/18 223 lb 12.8 oz (101.5 kg)     GEN:  Well nourished, well developed in no acute distress HEENT: Normal NECK: No JVD; No carotid bruits LYMPHATICS: No lymphadenopathy CARDIAC: RRR, no murmurs, no rubs, no gallops RESPIRATORY:  Clear to auscultation without rales, wheezing or rhonchi  ABDOMEN: Soft, non-tender, non-distended MUSCULOSKELETAL:  No edema; No deformity  SKIN: Warm and dry LOWER EXTREMITIES: no swelling NEUROLOGIC:  Alert and oriented x 3 PSYCHIATRIC:  Normal affect   ASSESSMENT:    1. Sinus bradycardia   2. Benign essential HTN   3. Precordial pain    PLAN:    In order of problems listed above:  1. Sinus bradycardia no dizziness no syncope.  No critical.  We will continue monitoring if it became worse beta-blocker will be discontinued. 2. Benign essential hypertension blood pressure controlled continue present management. 3. Precordial chest pain denies having any. 4.  5. Overall she is doing well we will continue present management   Medication Adjustments/Labs and Tests Ordered: Current medicines are reviewed at length with the patient today.  Concerns regarding medicines are outlined above.  No orders of the defined types were placed in this encounter.  Medication changes: No orders of the defined types were placed in this encounter.   Signed, Park Liter, MD, [facility] 04/12/2019 3:09 PM    [place]

## 2019-04-12 NOTE — Patient Instructions (Addendum)
Medication Instructions:  Your physician recommends that you continue on your current medications as directed. Please refer to the Current Medication list given to you today.  If you need a refill on your cardiac medications before your next appointment, please call your pharmacy.   Lab work:  None  If you have labs (blood work) drawn today and your tests are completely normal, you will receive your results only by: Marland Kitchen MyChart Message (if you have MyChart) OR . A paper copy in the mail If you have any lab test that is abnormal or we need to change your treatment, we will call you to review the results.  Testing/Procedures:  None  Follow-Up: At [facility], you and your health needs are our priority.  As part of our continuing mission to provide you with exceptional heart care, we have created designated Provider Care Teams.  These Care Teams include your primary Cardiologist (physician) and Advanced Practice Providers (APPs -  Physician Assistants and Nurse Practitioners) who all work together to provide you with the care you need, when you need it. . You will need a follow up appointment in 5 months.   Any Other Special Instructions Will Be Listed Below (If Applicable).  N/A

## 2019-04-17 ENCOUNTER — Other Ambulatory Visit: Payer: Self-pay | Admitting: Family Medicine

## 2019-04-17 DIAGNOSIS — M8580 Other specified disorders of bone density and structure, unspecified site: Secondary | ICD-10-CM

## 2019-04-17 DIAGNOSIS — Z1231 Encounter for screening mammogram for malignant neoplasm of breast: Secondary | ICD-10-CM

## 2019-04-18 IMAGING — DX DG PORTABLE PELVIS
1 series · 1 of 1 positions shown · non-contrast
Comparison: None.

CLINICAL DATA: Postop right hip replacement

EXAM:
PORTABLE PELVIS 1-2 VIEWS

[pelvis ap]
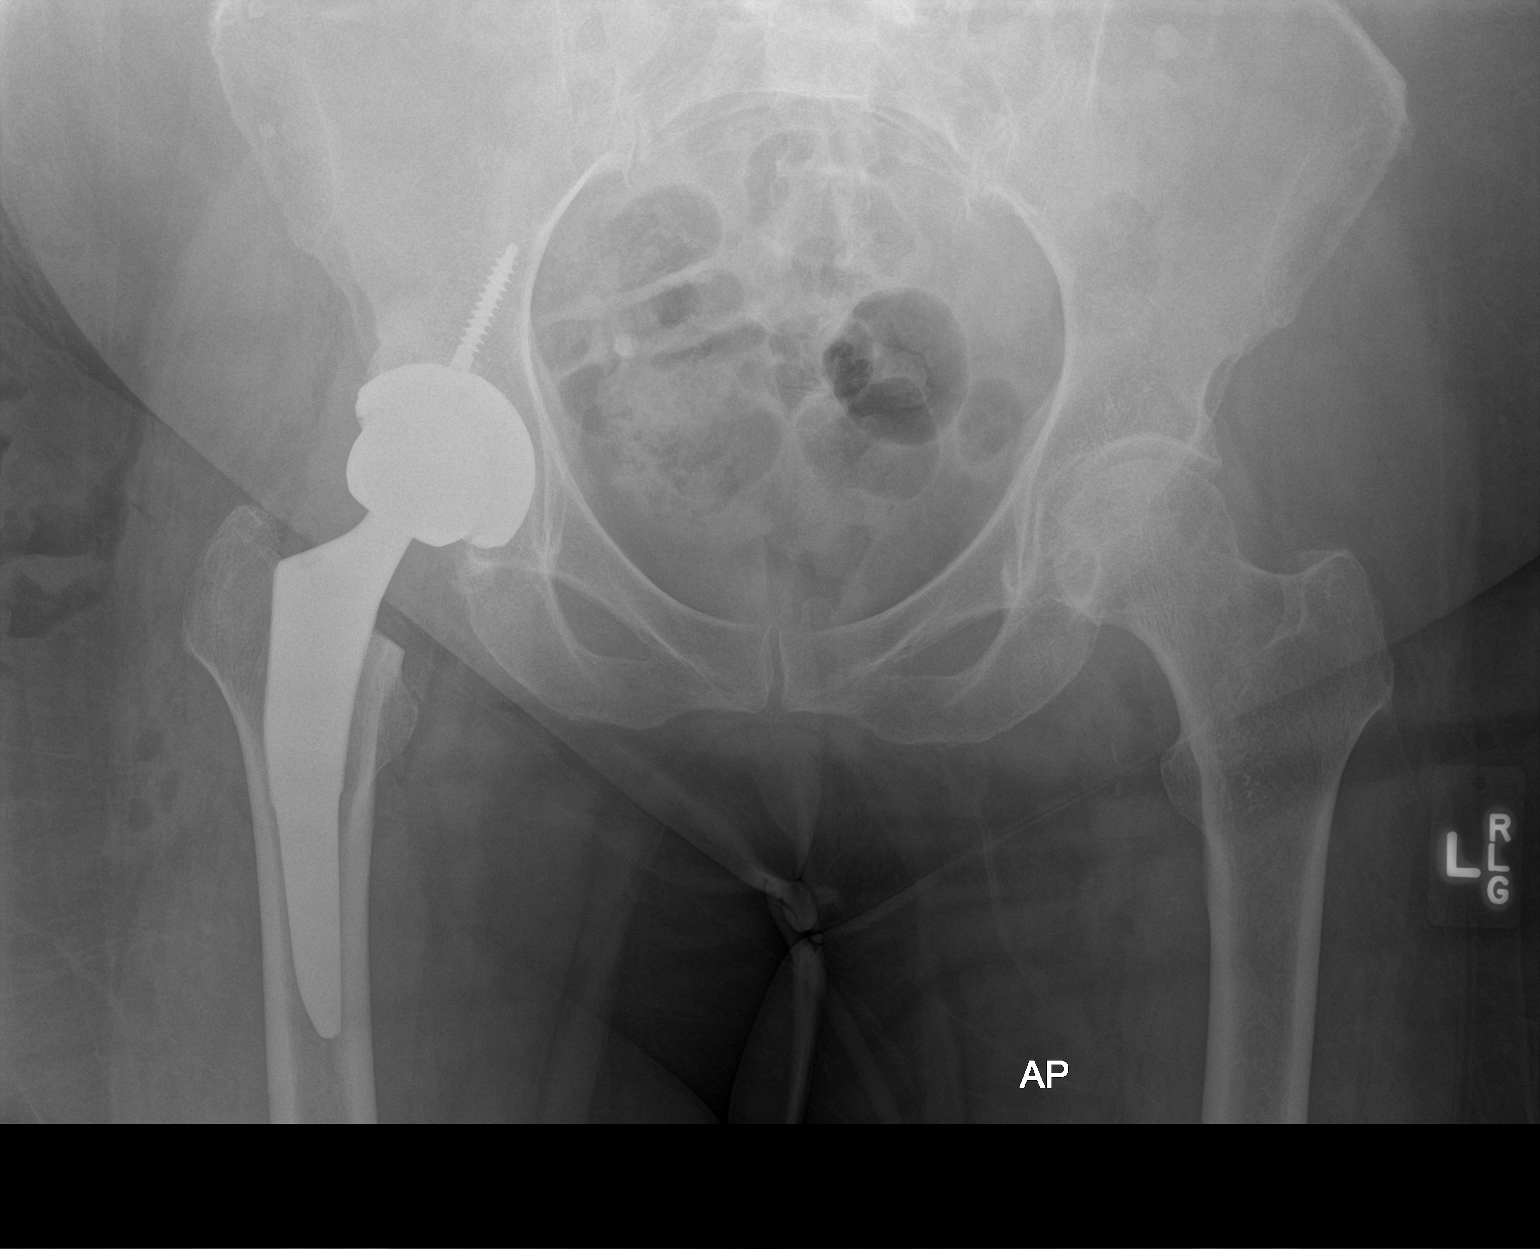

[1 of 1 positions shown; findings below may reference images not displayed]

FINDINGS: Patient is status post right hip replacement. Hardware is in good
position. No other acute abnormalities.
IMPRESSION: Status post right hip replacement as above.

## 2019-06-27 ENCOUNTER — Ambulatory Visit
Admission: RE | Admit: 2019-06-27 | Discharge: 2019-06-27 | Disposition: A | Discharge status: Home / Self Care | Payer: Medicare Other | Source: Ambulatory Visit | Attending: Family Medicine | Admitting: Family Medicine

## 2019-06-27 ENCOUNTER — Other Ambulatory Visit: Payer: Self-pay

## 2019-06-27 DIAGNOSIS — Z1231 Encounter for screening mammogram for malignant neoplasm of breast: Secondary | ICD-10-CM

## 2019-06-27 DIAGNOSIS — M8580 Other specified disorders of bone density and structure, unspecified site: Secondary | ICD-10-CM

## 2019-10-01 ENCOUNTER — Ambulatory Visit: Payer: Medicare Other | Admitting: Cardiology

## 2019-10-31 ENCOUNTER — Encounter: Payer: Self-pay | Admitting: Cardiology

## 2019-10-31 ENCOUNTER — Telehealth: Payer: Self-pay | Admitting: Cardiology

## 2019-10-31 NOTE — Telephone Encounter (Signed)
Error

## 2019-10-31 NOTE — Telephone Encounter (Signed)
New Message  Pt c/o of Chest Pain: STAT if CP now or developed within 24 hours  1. Are you having CP right now? Like a dull ache  2. Are you experiencing any other symptoms (ex. SOB, nausea, vomiting, sweating)? SOB,   3. How long have you been experiencing CP? Today  4. Is your CP continuous or coming and going? Coming and going  5. Have you taken Nitroglycerin? No. Took aspirin 10 min ago ?

## 2019-10-31 NOTE — Telephone Encounter (Signed)
Called patient. She reports after walking into work today she began to feel short of breath (from wearing a mask) but she also felt a dull pain in the middle of her chest. It lasted for 3 hours, it did not radiate, she denied nausea. She reports that she took (10) 81 mg aspirin. She denies any pain now. I consult with Dr. Agustin Cree, patient scheduled for a appointment tomorrow. She was advised to go to the emergency room if pain returned over night she verbally understood no further questions.

## 2019-10-31 NOTE — Telephone Encounter (Signed)
Left message for patient to return call.

## 2019-11-01 ENCOUNTER — Emergency Department (HOSPITAL_BASED_OUTPATIENT_CLINIC_OR_DEPARTMENT_OTHER): Payer: Medicare Other

## 2019-11-01 ENCOUNTER — Ambulatory Visit (INDEPENDENT_AMBULATORY_CARE_PROVIDER_SITE_OTHER): Payer: Medicare Other | Admitting: Cardiology

## 2019-11-01 ENCOUNTER — Encounter (HOSPITAL_BASED_OUTPATIENT_CLINIC_OR_DEPARTMENT_OTHER): Payer: Self-pay | Admitting: Emergency Medicine

## 2019-11-01 ENCOUNTER — Other Ambulatory Visit: Payer: Self-pay

## 2019-11-01 ENCOUNTER — Encounter: Payer: Self-pay | Admitting: Cardiology

## 2019-11-01 ENCOUNTER — Inpatient Hospital Stay (HOSPITAL_BASED_OUTPATIENT_CLINIC_OR_DEPARTMENT_OTHER)
Admission: EM | Admit: 2019-11-01 | Discharge: 2019-11-03 | DRG: 247 | Disposition: A | Discharge status: Home / Self Care | Payer: Medicare Other | Attending: Cardiovascular Disease | Admitting: Cardiovascular Disease

## 2019-11-01 VITALS — BP 132/80 | HR 120 | Ht 63.5 in | Wt 237.0 lb

## 2019-11-01 DIAGNOSIS — Z6841 Body Mass Index (BMI) 40.0 and over, adult: Secondary | ICD-10-CM | POA: Diagnosis not present

## 2019-11-01 DIAGNOSIS — S40021A Contusion of right upper arm, initial encounter: Secondary | ICD-10-CM | POA: Diagnosis not present

## 2019-11-01 DIAGNOSIS — I482 Chronic atrial fibrillation, unspecified: Secondary | ICD-10-CM | POA: Diagnosis present

## 2019-11-01 DIAGNOSIS — E785 Hyperlipidemia, unspecified: Secondary | ICD-10-CM | POA: Diagnosis present

## 2019-11-01 DIAGNOSIS — I129 Hypertensive chronic kidney disease with stage 1 through stage 4 chronic kidney disease, or unspecified chronic kidney disease: Secondary | ICD-10-CM | POA: Diagnosis present

## 2019-11-01 DIAGNOSIS — Z820 Family history of epilepsy and other diseases of the nervous system: Secondary | ICD-10-CM

## 2019-11-01 DIAGNOSIS — Z955 Presence of coronary angioplasty implant and graft: Secondary | ICD-10-CM

## 2019-11-01 DIAGNOSIS — R001 Bradycardia, unspecified: Secondary | ICD-10-CM | POA: Diagnosis present

## 2019-11-01 DIAGNOSIS — Z8719 Personal history of other diseases of the digestive system: Secondary | ICD-10-CM | POA: Diagnosis not present

## 2019-11-01 DIAGNOSIS — I2584 Coronary atherosclerosis due to calcified coronary lesion: Secondary | ICD-10-CM | POA: Diagnosis present

## 2019-11-01 DIAGNOSIS — Z79899 Other long term (current) drug therapy: Secondary | ICD-10-CM | POA: Diagnosis not present

## 2019-11-01 DIAGNOSIS — N182 Chronic kidney disease, stage 2 (mild): Secondary | ICD-10-CM | POA: Diagnosis present

## 2019-11-01 DIAGNOSIS — M1990 Unspecified osteoarthritis, unspecified site: Secondary | ICD-10-CM | POA: Diagnosis present

## 2019-11-01 DIAGNOSIS — R002 Palpitations: Secondary | ICD-10-CM | POA: Diagnosis not present

## 2019-11-01 DIAGNOSIS — I10 Essential (primary) hypertension: Secondary | ICD-10-CM

## 2019-11-01 DIAGNOSIS — R079 Chest pain, unspecified: Secondary | ICD-10-CM | POA: Diagnosis not present

## 2019-11-01 DIAGNOSIS — I2510 Atherosclerotic heart disease of native coronary artery without angina pectoris: Secondary | ICD-10-CM | POA: Diagnosis not present

## 2019-11-01 DIAGNOSIS — R072 Precordial pain: Secondary | ICD-10-CM | POA: Diagnosis present

## 2019-11-01 DIAGNOSIS — F329 Major depressive disorder, single episode, unspecified: Secondary | ICD-10-CM | POA: Diagnosis present

## 2019-11-01 DIAGNOSIS — Z96641 Presence of right artificial hip joint: Secondary | ICD-10-CM | POA: Diagnosis present

## 2019-11-01 DIAGNOSIS — I214 Non-ST elevation (NSTEMI) myocardial infarction: Principal | ICD-10-CM

## 2019-11-01 DIAGNOSIS — Z833 Family history of diabetes mellitus: Secondary | ICD-10-CM

## 2019-11-01 DIAGNOSIS — E669 Obesity, unspecified: Secondary | ICD-10-CM | POA: Diagnosis present

## 2019-11-01 DIAGNOSIS — I4819 Other persistent atrial fibrillation: Secondary | ICD-10-CM

## 2019-11-01 DIAGNOSIS — Z85828 Personal history of other malignant neoplasm of skin: Secondary | ICD-10-CM | POA: Diagnosis not present

## 2019-11-01 DIAGNOSIS — Z8249 Family history of ischemic heart disease and other diseases of the circulatory system: Secondary | ICD-10-CM

## 2019-11-01 DIAGNOSIS — R0600 Dyspnea, unspecified: Secondary | ICD-10-CM

## 2019-11-01 DIAGNOSIS — R0609 Other forms of dyspnea: Secondary | ICD-10-CM

## 2019-11-01 DIAGNOSIS — Z20822 Contact with and (suspected) exposure to covid-19: Secondary | ICD-10-CM | POA: Diagnosis present

## 2019-11-01 DIAGNOSIS — I4891 Unspecified atrial fibrillation: Secondary | ICD-10-CM | POA: Diagnosis not present

## 2019-11-01 HISTORY — DX: Non-ST elevation (NSTEMI) myocardial infarction: I21.4

## 2019-11-01 HISTORY — DX: Dyspnea, unspecified: R06.00

## 2019-11-01 HISTORY — DX: Other forms of dyspnea: R06.09

## 2019-11-01 LAB — TROPONIN I (HIGH SENSITIVITY)
Troponin I (High Sensitivity): 1312 ng/L (ref <18)
Troponin I (High Sensitivity): 1950 ng/L (ref <18)
Troponin I (High Sensitivity): 1434 ng/L (ref <18)
Troponin I (High Sensitivity): 1170 ng/L (ref <18)

## 2019-11-01 LAB — MAGNESIUM: Magnesium: 2.1 mg/dL (ref 1.7–2.4)

## 2019-11-01 LAB — BRAIN NATRIURETIC PEPTIDE: B Natriuretic Peptide: 410.5 pg/mL — ABNORMAL HIGH (ref 0.0–100.0)

## 2019-11-01 LAB — BASIC METABOLIC PANEL
Anion gap: 9 (ref 5–15)
BUN: 23 mg/dL (ref 8–23)
CO2: 21 mmol/L — ABNORMAL LOW (ref 22–32)
Calcium: 9.2 mg/dL (ref 8.9–10.3)
Chloride: 109 mmol/L (ref 98–111)
Creatinine, Ser: 1.25 mg/dL — ABNORMAL HIGH (ref 0.44–1.00)
GFR calc Af Amer: 49 mL/min — ABNORMAL LOW (ref >60)
GFR calc non Af Amer: 42 mL/min — ABNORMAL LOW (ref >60)
Glucose, Bld: 109 mg/dL — ABNORMAL HIGH (ref 70–99)
Potassium: 3.8 mmol/L (ref 3.5–5.1)
Sodium: 139 mmol/L (ref 135–145)

## 2019-11-01 LAB — CBC
HCT: 44.3 % (ref 36.0–46.0)
Hemoglobin: 14.5 g/dL (ref 12.0–15.0)
MCH: 30.7 pg (ref 26.0–34.0)
MCHC: 32.7 g/dL (ref 30.0–36.0)
MCV: 93.9 fL (ref 80.0–100.0)
Platelets: 204 10*3/uL (ref 150–400)
RBC: 4.72 MIL/uL (ref 3.87–5.11)
RDW: 13.2 % (ref 11.5–15.5)
WBC: 5.8 10*3/uL (ref 4.0–10.5)
nRBC: 0 % (ref 0.0–0.2)

## 2019-11-01 LAB — HEMOGLOBIN A1C
Hgb A1c MFr Bld: 5.5 % (ref 4.8–5.6)
Mean Plasma Glucose: 111.15 mg/dL

## 2019-11-01 LAB — PROTIME-INR
INR: 1.1 (ref 0.8–1.2)
Prothrombin Time: 13.7 seconds (ref 11.4–15.2)

## 2019-11-01 LAB — SARS CORONAVIRUS 2 BY RT PCR (HOSPITAL ORDER, PERFORMED IN ~~LOC~~ HOSPITAL LAB): SARS Coronavirus 2: NEGATIVE

## 2019-11-01 LAB — HEPARIN LEVEL (UNFRACTIONATED): Heparin Unfractionated: 0.79 IU/mL — ABNORMAL HIGH (ref 0.30–0.70)

## 2019-11-01 LAB — APTT: aPTT: 28 seconds (ref 24–36)

## 2019-11-01 LAB — TSH: TSH: 3.181 u[IU]/mL (ref 0.350–4.500)

## 2019-11-01 MED ORDER — HEPARIN BOLUS VIA INFUSION
4000.0000 [IU] | Freq: Once | INTRAVENOUS | Status: AC
  Administered 2019-11-01: 11:00:00 4000 [IU] via INTRAVENOUS

## 2019-11-01 MED ORDER — NITROGLYCERIN 0.4 MG SL SUBL: 0.4000 mg | SUBLINGUAL_TABLET | SUBLINGUAL | Status: DC | PRN

## 2019-11-01 MED ORDER — ASPIRIN 81 MG PO CHEW
81.0000 mg | CHEWABLE_TABLET | ORAL | Status: AC
  Administered 2019-11-02: 81 mg via ORAL
  Filled 2019-11-01: qty 1

## 2019-11-01 MED ORDER — HEPARIN (PORCINE) 25000 UT/250ML-% IV SOLN
900.0000 [IU]/h | INTRAVENOUS | Status: DC
  Administered 2019-11-01: 11:00:00 1000 [IU]/h via INTRAVENOUS
  Administered 2019-11-02: 900 [IU]/h via INTRAVENOUS
  Filled 2019-11-01 (×2): qty 250

## 2019-11-01 MED ORDER — SODIUM CHLORIDE 0.9% FLUSH
3.0000 mL | Freq: Two times a day (BID) | INTRAVENOUS | Status: DC
  Administered 2019-11-01 – 2019-11-02 (×3): 3 mL via INTRAVENOUS

## 2019-11-01 MED ORDER — METOPROLOL TARTRATE 25 MG PO TABS
25.0000 mg | ORAL_TABLET | Freq: Two times a day (BID) | ORAL | Status: DC
  Administered 2019-11-02 (×2): 25 mg via ORAL
  Filled 2019-11-01 (×3): qty 1

## 2019-11-01 MED ORDER — SODIUM CHLORIDE 0.9% FLUSH: 3.0000 mL | INTRAVENOUS | Status: DC | PRN

## 2019-11-01 MED ORDER — ACETAMINOPHEN 325 MG PO TABS: 650.0000 mg | ORAL_TABLET | ORAL | Status: DC | PRN

## 2019-11-01 MED ORDER — ATORVASTATIN CALCIUM 80 MG PO TABS: 80.0000 mg | ORAL_TABLET | Freq: Every day | ORAL | Status: DC

## 2019-11-01 MED ORDER — ONDANSETRON HCL 4 MG/2ML IJ SOLN: 4.0000 mg | Freq: Four times a day (QID) | INTRAMUSCULAR | Status: DC | PRN

## 2019-11-01 MED ORDER — SODIUM CHLORIDE 0.9 % IV SOLN: 250.0000 mL | INTRAVENOUS | Status: DC | PRN

## 2019-11-01 MED ORDER — METOPROLOL TARTRATE 5 MG/5ML IV SOLN
2.5000 mg | Freq: Once | INTRAVENOUS | Status: AC
  Administered 2019-11-01: 11:00:00 2.5 mg via INTRAVENOUS
  Filled 2019-11-01: qty 5

## 2019-11-01 MED ORDER — SODIUM CHLORIDE 0.9 % IV SOLN: INTRAVENOUS | Status: DC

## 2019-11-01 MED ORDER — ASPIRIN 81 MG PO CHEW
324.0000 mg | CHEWABLE_TABLET | Freq: Once | ORAL | Status: AC
  Administered 2019-11-01: 11:00:00 324 mg via ORAL
  Filled 2019-11-01: qty 4

## 2019-11-01 MED ORDER — SODIUM CHLORIDE 0.9 % WEIGHT BASED INFUSION
1.0000 mL/kg/h | INTRAVENOUS | Status: DC
  Administered 2019-11-02: 1 mL/kg/h via INTRAVENOUS

## 2019-11-01 MED ORDER — ATORVASTATIN CALCIUM 80 MG PO TABS: 80.0000 mg | ORAL_TABLET | Freq: Once | ORAL | Status: DC

## 2019-11-01 MED ORDER — SODIUM CHLORIDE 0.9 % WEIGHT BASED INFUSION
3.0000 mL/kg/h | INTRAVENOUS | Status: DC
  Administered 2019-11-02: 3 mL/kg/h via INTRAVENOUS

## 2019-11-01 MED ORDER — FUROSEMIDE 10 MG/ML IJ SOLN
20.0000 mg | Freq: Once | INTRAMUSCULAR | Status: AC
  Administered 2019-11-01: 20 mg via INTRAVENOUS
  Filled 2019-11-01: qty 2

## 2019-11-01 MED ORDER — ASPIRIN EC 81 MG PO TBEC: 81.0000 mg | DELAYED_RELEASE_TABLET | Freq: Every day | ORAL | Status: DC

## 2019-11-01 NOTE — Progress Notes (Signed)
Cardiology Office Note:    Date:  11/01/2019   ID:  Melissa Clements, DOB Oct 26, 1945, MRN [id number]  PCP:  [name], MD  Cardiologist:  Jenne Campus, MD    Referring MD: [name], MD   Chief Complaint  Patient presents with  . 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  She called [place] yesterday complaining of having some palpitation as well as chest pain.  She was advised to go to the emergency room.  She refused.  She took 10 tablets of aspirin.  And today she came to our office.  She said yesterday she was walking with the mask on and while she was walking she still having shortness of breath as well as chest pain.  Since that time she is on and off having shortness of breath.  As well as chest tightness.  Initial sensation was 3-4 in scale up to 10.  Today when I am seeing her in the office and she said is only barely noticeable maybe 1 in scale up to 10.  She also noticed some palpitations.  She was aware of her heart speeding up.  EKG showed evidence of atrial fibrillation which is a new discovery.  On top of that she does have significant ST segment changes with Q wave in lateral wall with still some persistent ST segment elevation. Denies having any nausea or vomiting. There is no dizziness.  Past Medical History:  Diagnosis Date  . Arthritis    end stage right hip  . Basal cell carcinoma   . Chronic kidney disease    stage III, related to high blood pressure medication  . Colon polyp   . Depression   . History of chest pain   . History of dizziness   . History of palpitations   . Hyperlipidemia   . Hypertension     Past Surgical History:  Procedure Laterality Date  . CATARACT EXTRACTION, BILATERAL    . COLONOSCOPY    . DIAGNOSTIC LAPAROSCOPY    . DILATION AND  CURETTAGE OF UTERUS    . TOTAL HIP ARTHROPLASTY Right 01/24/2018   Procedure: RIGHT TOTAL HIP ARTHROPLASTY ANTERIOR APPROACH;  Surgeon: Paralee Cancel, MD;  Location: [facility];  Service: Orthopedics;  Laterality: Right;  70 mins    Current Medications: Current Meds  Medication Sig  . amLODipine (NORVASC) 2.5 MG tablet Take 2.5 mg by mouth daily.  . Cholecalciferol (VITAMIN D) 2000 units tablet Take 6,000 Units by mouth daily.  Marland Kitchen docusate sodium (COLACE) 100 MG capsule Take 1 capsule (100 mg total) by mouth 2 (two) times daily. (Patient taking differently: Take 100 mg by mouth daily. )  . metoprolol succinate (TOPROL-XL) 50 MG 24 hr tablet Take 12.5 mg by mouth daily.   . simvastatin (ZOCOR) 40 MG tablet Take 40 mg by mouth daily.     Allergies:   Patient has no known allergies.   Social History   Socioeconomic History  . Marital status: Married    Spouse name: [name]  . Number of children: 1  . Years of education: college  . Highest education level: Not on file  Occupational History  . Occupation: retired  Tobacco Use  . Smoking status: Never Smoker  . Smokeless tobacco: Never Used  Substance and Sexual  Activity  . Alcohol use: No    Alcohol/week: 0.0 standard drinks  . Drug use: No  . Sexual activity: Not on file  Other Topics Concern  . Not on file  Social History Narrative  . Not on file   Social Determinants of Health   Financial Resource Strain:   . Difficulty of Paying Living Expenses: Not on file  Food Insecurity:   . Worried About Charity fundraiser in the Last Year: Not on file  . Ran Out of Food in the Last Year: Not on file  Transportation Needs:   . Lack of Transportation (Medical): Not on file  . Lack of Transportation (Non-Medical): Not on file  Physical Activity:   . Days of Exercise per Week: Not on file  . Minutes of Exercise per Session: Not on file  Stress:   . Feeling of Stress : Not on file  Social Connections:   . Frequency of Communication  with Friends and Family: Not on file  . Frequency of Social Gatherings with Friends and Family: Not on file  . Attends Religious Services: Not on file  . Active Member of Clubs or Organizations: Not on file  . Attends Archivist Meetings: Not on file  . Marital Status: Not on file     Family History: The patient's family history includes Alzheimer's disease in her brother and mother; Diabetes in her mother; Heart attack (age of onset: 28) in her father; Heart disease in her father. ROS:   Please see the history of present illness.    All 14 point review of systems negative except as described per history of present illness  EKGs/Labs/Other Studies Reviewed:      Recent Labs: No results found for requested labs within last 8760 hours.  Recent Lipid Panel No results found for: CHOL, TRIG, HDL, CHOLHDL, VLDL, LDLCALC, LDLDIRECT  Physical Exam:    VS:  BP 132/80   Pulse (!) 120   Ht 5' 3.5" (1.613 m)   Wt 237 lb (107.5 kg)   SpO2 97%   BMI 41.32 kg/m     Wt Readings from Last 3 Encounters:  11/01/19 237 lb (107.5 kg)  04/12/19 237 lb (107.5 kg)  10/19/18 235 lb (106.6 kg)     GEN:  Well nourished, well developed in no acute distress HEENT: Normal NECK: No JVD; No carotid bruits LYMPHATICS: No lymphadenopathy CARDIAC irregularly irregular, tachycardic, no murmurs, no rubs, no gallops RESPIRATORY:  Clear to auscultation without rales, wheezing or rhonchi  ABDOMEN: Soft, non-tender, non-distended MUSCULOSKELETAL:  No edema; No deformity  SKIN: Warm and dry LOWER EXTREMITIES: no swelling NEUROLOGIC:  Alert and oriented x 3 PSYCHIATRIC:  Normal affect   ASSESSMENT:    1. Benign essential HTN   2. Sinus bradycardia   3. Precordial chest pain   4. Palpitations    PLAN:    In order of problems listed above:  1. Precordial chest pain, very worrisome, on top of that she does have new onset of atrial fibrillation and also significant ST segment changes with  already Q waves.  I suspect she did suffer from myocardial infarction most likely yesterday.   She will be transferred to the emergency room for evaluation.  EKG will be redone, she need to be started on anticoagulation probably heparin will be the best choice.  She already took 10 aspirins yesterday we need to continue with that but at appropriate dose..  I will also suggest to slowing down her  heart rate probably beta-blocker will be quite reasonable however we need to be careful since she does have history of bradycardia with beta-blocker before while in sinus rhythm.  Last evaluation for her bradycardia was done a year ago almost to the date her average heart rate at that time was 57 with minimum heart rate 45.  She may require aggressive management which may include cardiac catheterization.  She does have however baseline kidney dysfunction.  Also duration of symptoms of from 10:00AM in the morning yesterday which is more than 24 hours.  Pain is barely noticeable right now. 2. Palpitations related to atrial fibrillation 3. Paroxysmal atrial fibrillation this is first documented episode of atrial fibrillation.  She will need to be long-term anticoagulated as well as her right knee to be controlled.   Medication Adjustments/Labs and Tests Ordered: Current medicines are reviewed at length with the patient today.  Concerns regarding medicines are outlined above.  No orders of the defined types were placed in this encounter.  Medication changes: No orders of the defined types were placed in this encounter.   Signed, Park Liter, MD, [facility] 11/01/2019 10:37 AM    [place]

## 2019-11-01 NOTE — ED Provider Notes (Signed)
[place] EMERGENCY DEPARTMENT Provider Note   CSN: [id number] Arrival date & time: 11/01/19  1026     History Chief Complaint  Patient presents with  . Chest Pain    Melissa Clements is a 74 y.o. female.  She is brought down by the cardiologist office upstairs for evaluation of chest pain and shortness of breath.  Concerning EKG showing new A. fib and possible ST elevations.  Patient states she has been short of breath for a while, blames it on the mask she has to wear.  Yesterday she started with some substernal chest pressure that worsens with exertion and improved with rest.  No radiation no other associated symptoms other than the shortness of breath.  No recent illness symptoms.  She gets some chronic edema in her legs but it is no worse than normal.  She went to her cardiologist office today who was concerned and brought her here for evaluation.  No known history of coronary disease.  The history is provided by the patient.  Chest Pain Pain location:  Substernal area Pain quality: pressure   Pain radiates to:  Does not radiate Pain severity:  Mild Onset quality:  Gradual Duration:  24 hours Timing:  Intermittent Progression:  Resolved Chronicity:  New Relieved by:  Rest Worsened by:  Exertion Ineffective treatments:  None tried Associated symptoms: shortness of breath   Associated symptoms: no abdominal pain, no altered mental status, no back pain, no claudication, no cough, no diaphoresis, no dizziness, no fever, no nausea and no vomiting   Risk factors: hypertension        Past Medical History:  Diagnosis Date  . Arthritis    end stage right hip  . Basal cell carcinoma   . Chronic kidney disease    stage III, related to high blood pressure medication  . Colon polyp   . Depression   . History of chest pain   . History of dizziness   . History of palpitations   . Hyperlipidemia   . Hypertension     Patient Active Problem List   Diagnosis Date  Noted  . Dyspnea on exertion 11/01/2019  . Obese 01/25/2018  . S/P right THA, AA 01/24/2018  . Sinus bradycardia 01/20/2018  . Precordial chest pain 01/23/2015  . Palpitations 01/23/2015  . Benign essential HTN 01/23/2015  . Dizziness 01/23/2015  . Basal cell carcinoma   . Colon polyp     Past Surgical History:  Procedure Laterality Date  . CATARACT EXTRACTION, BILATERAL    . COLONOSCOPY    . DIAGNOSTIC LAPAROSCOPY    . DILATION AND CURETTAGE OF UTERUS    . TOTAL HIP ARTHROPLASTY Right 01/24/2018   Procedure: RIGHT TOTAL HIP ARTHROPLASTY ANTERIOR APPROACH;  Surgeon: Paralee Cancel, MD;  Location: [facility];  Service: Orthopedics;  Laterality: Right;  70 mins     OB History   No obstetric history on file.     Family History  Problem Relation Age of Onset  . Heart attack Father 28  . Heart disease Father   . Diabetes Mother   . Alzheimer's disease Mother   . Alzheimer's disease Brother     Social History   Tobacco Use  . Smoking status: Never Smoker  . Smokeless tobacco: Never Used  Substance Use Topics  . Alcohol use: No    Alcohol/week: 0.0 standard drinks  . Drug use: No    Home Medications Prior to Admission medications   Medication Sig Start Date  End Date Taking? Authorizing Provider  amLODipine (NORVASC) 2.5 MG tablet Take 2.5 mg by mouth daily.    [provider]  Cholecalciferol (VITAMIN D) 2000 units tablet Take 6,000 Units by mouth daily.    [provider]  Collagen-Boron-Hyaluronic Acid ([employer] JOINT HEALTH TRIPLE ACTION PO) Take 1 capsule by mouth daily.    [provider]  docusate sodium (COLACE) 100 MG capsule Take 1 capsule (100 mg total) by mouth 2 (two) times daily. Patient taking differently: Take 100 mg by mouth daily.  01/24/18   Danae Orleans, PA-C  metoprolol succinate (TOPROL-XL) 50 MG 24 hr tablet Take 12.5 mg by mouth daily.  12/18/14   [provider]  simvastatin (ZOCOR) 40 MG tablet Take 40 mg by mouth  daily. 12/10/14   [provider]    Allergies    Patient has no known allergies.  Review of Systems   Review of Systems  Constitutional: Negative for diaphoresis and fever.  HENT: Negative for sore throat.   Eyes: Negative for visual disturbance.  Respiratory: Positive for shortness of breath. Negative for cough.   Cardiovascular: Positive for chest pain. Negative for claudication.  Gastrointestinal: Negative for abdominal pain, nausea and vomiting.  Genitourinary: Negative for dysuria.  Musculoskeletal: Negative for back pain.  Skin: Negative for rash.  Neurological: Negative for dizziness.    Physical Exam Updated Vital Signs BP 113/87 (BP Location: Left Arm)   Pulse 85   Temp 98.8 F (37.1 C) (Oral)   Resp 20   Ht 5\' 5"  (1.651 m)   Wt 111.4 kg   SpO2 100%   BMI 40.87 kg/m   Physical Exam Vitals and nursing note reviewed.  Constitutional:      General: She is not in acute distress.    Appearance: She is well-developed.  HENT:     Head: Normocephalic and atraumatic.  Eyes:     Conjunctiva/sclera: Conjunctivae normal.  Cardiovascular:     Rate and Rhythm: Tachycardia present. Rhythm irregular.     Heart sounds: Normal heart sounds. No murmur.  Pulmonary:     Effort: Pulmonary effort is normal. No respiratory distress.     Breath sounds: Normal breath sounds.  Abdominal:     Palpations: Abdomen is soft.     Tenderness: There is no abdominal tenderness.  Musculoskeletal:        General: Normal range of motion.     Cervical back: Neck supple.     Right lower leg: No tenderness. Edema present.     Left lower leg: No tenderness. Edema present.  Skin:    General: Skin is warm and dry.     Capillary Refill: Capillary refill takes less than 2 seconds.  Neurological:     General: No focal deficit present.     Mental Status: She is alert.     Gait: Gait normal.     ED Results / Procedures / Treatments   Labs (all labs ordered are listed, but only  abnormal results are displayed) Labs Reviewed  BASIC METABOLIC PANEL - Abnormal; Notable for the following components:      Result Value   CO2 21 (*)    Glucose, Bld 109 (*)    Creatinine, Ser 1.25 (*)    GFR calc non Af Amer 42 (*)    GFR calc Af Amer 49 (*)    All other components within normal limits  BRAIN NATRIURETIC PEPTIDE - Abnormal; Notable for the following components:   B Natriuretic Peptide  410.5 (*)    All other components within normal limits  TROPONIN I (HIGH SENSITIVITY) - Abnormal; Notable for the following components:   Troponin I (High Sensitivity) 1,312 (*)    All other components within normal limits  TROPONIN I (HIGH SENSITIVITY) - Abnormal; Notable for the following components:   Troponin I (High Sensitivity) 1,950 (*)    All other components within normal limits  SARS CORONAVIRUS 2 BY RT PCR (HOSPITAL ORDER, [facility])  CBC  HEMOGLOBIN A1C  PROTIME-INR  APTT  LIPID PANEL  MAGNESIUM  TSH  HEPARIN LEVEL (UNFRACTIONATED)    EKG EKG Interpretation  Date/Time:  Thursday November 01 2019 10:34:38 EST Ventricular Rate:  103 PR Interval:    QRS Duration: 89 QT Interval:  332 QTC Calculation: 435 R Axis:   7 Text Interpretation: Atrial fibrillation Probable anteroseptal infarct, recent new afib and anterior q wave Confirmed by Aletta Edouard [phone]) on 11/01/2019 10:39:01 AM   Radiology DG Chest Port 1 View  Result Date: 11/01/2019 CLINICAL DATA:  Shortness of breath.  Diaphoresis. EXAM: PORTABLE CHEST 1 VIEW COMPARISON:  None. FINDINGS: Cardiac silhouette normal in size. No mediastinal or hilar masses. No evidence of adenopathy. Clear lungs.  No pleural effusion or pneumothorax. Skeletal structures are demineralized but grossly intact. IMPRESSION: No active disease. Electronically Signed   By: Lajean Manes M.D.   On: 11/01/2019 10:52    Procedures .Critical Care Performed by: Hayden Rasmussen, MD Authorized by: Hayden Rasmussen, MD   Critical care provider statement:    Critical care time (minutes):  45   Critical care time was exclusive of:  Separately billable procedures and treating other patients   Critical care was necessary to treat or prevent imminent or life-threatening deterioration of the following conditions:  Cardiac failure   Critical care was time spent personally by me on the following activities:  Discussions with consultants, evaluation of patient's response to treatment, examination of patient, ordering and performing treatments and interventions, ordering and review of laboratory studies, ordering and review of radiographic studies, pulse oximetry, re-evaluation of patient's condition, obtaining history from patient or surrogate, review of old charts and development of treatment plan with patient or surrogate   I assumed direction of critical care for this patient from another provider in my specialty: no     (including critical care time)  Medications Ordered in ED Medications  0.9 %  sodium chloride infusion (has no administration in time range)  aspirin chewable tablet 324 mg (has no administration in time range)  heparin bolus via infusion 4,000 Units (has no administration in time range)  heparin ADULT infusion 100 units/mL (25000 units/249mL sodium chloride 0.45%) (has no administration in time range)    ED Course  I have reviewed the triage vital signs and the nursing notes.  Pertinent labs & imaging results that were available during my care of the patient were reviewed by me and considered in my medical decision making (see chart for details).  Clinical Course as of Oct 31 1818  Thu Jan 28, 167  [id number] 74 year old female here with shortness of breath and what sounds like exertional angina.  EKG showing new onset A. fib and possibly some ST elevations laterally with some Q waves anteriorly.  Not having any active pain.  Differential includes unstable angina, ACS, pneumonia, CHF,  A. fib   [MB]  1055 Chest x-ray interpreted by me as no gross infiltrates no pneumothorax.   [MB]  1145 Troponin elevated at 1300.  BNP also slightly elevated at 410.   [MB]  1200 Discussed with Dr. Davina Poke cardiology [facility] group he recommends giving the patient 22 of Lipitor and continuing current management and will accept the patient for admission to telemetry [facility].   [MB]  [id number] Delta troponin rising to 1950.  Remains pain-free.   [MB]    Clinical Course User Index [MB] Hayden Rasmussen, MD   MDM Rules/Calculators/A&P                      Final Clinical Impression(s) / ED Diagnoses Final diagnoses:  NSTEMI (non-ST elevated myocardial infarction) [facility])  New onset a-fib [facility])    Rx / DC Orders ED Discharge Orders    None       Hayden Rasmussen, MD 11/01/19 [phone]

## 2019-11-01 NOTE — Progress Notes (Signed)
Pt received from [facility] via [facility]. VSS. No chest pain. Telemetry applied. CHG complete. Pt oriented to room and unit. Call light in reach.  Clyde Canterbury, RN

## 2019-11-01 NOTE — H&P (Signed)
Cardiology Admission History and Physical:   Patient ID: Melissa Clements MRN: [id number]; DOB: 1946/01/13   Admission date: 11/01/2019  Primary Care Provider: [name], MD Primary Cardiologist: Jenne Campus, MD  Primary Electrophysiologist:  None   Chief Complaint:  Chest pain, Afib  Patient Profile:   Melissa Clements is a 74 y.o. female with PMH of HTN, palpitations, CKD, arthritis and sinus bradycardia who presented to the office with chest pain.   History of Present Illness:   Melissa Clements is a 74 yo female with PMH noted above. She is a retired [employer] scientist. Followed by Dr. Agustin Cree as an outpatient. She presented to the office today with complaints of chest pain and palpitations. States these episodes actually started yesterday and called into the office. Was advised to go to the ED but refused. Yesterday she was walking and developed shortness of breath and chest tightness. Rated pain 3-4/10. Also had palpitations. Reports she took 10 ASA to help with her chest pain. In the office her EKG showed new onset afib with RVR. With her symptoms she was sent downstairs to the [facility] ED for admission.   In the ED her labs showed stable electrolytes, Cr 1.25, BNP 410, hsTn 1312>>1950, Hgb 14.5, TSH 3.181, Hgb A1c 5.5. CXR neg. She was started on IV heparin at [facility] and transferred to [facility] for further management and plans for cardiac cath.   Heart Pathway Score:     Past Medical History:  Diagnosis Date  . Arthritis    end stage right hip  . Basal cell carcinoma   . Chronic kidney disease    stage III, related to high blood pressure medication  . Colon polyp   . Depression   . History of chest pain   . History of dizziness   . History of palpitations   . Hyperlipidemia   . Hypertension     Past Surgical History:  Procedure Laterality Date  . CATARACT EXTRACTION, BILATERAL    . COLONOSCOPY    . DIAGNOSTIC LAPAROSCOPY    . DILATION AND CURETTAGE OF UTERUS    . TOTAL HIP  ARTHROPLASTY Right 01/24/2018   Procedure: RIGHT TOTAL HIP ARTHROPLASTY ANTERIOR APPROACH;  Surgeon: Paralee Cancel, MD;  Location: [facility];  Service: Orthopedics;  Laterality: Right;  70 mins     Medications Prior to Admission: Prior to Admission medications   Medication Sig Start Date End Date Taking? Authorizing Provider  amLODipine (NORVASC) 2.5 MG tablet Take 2.5 mg by mouth daily.    [provider]  Cholecalciferol (VITAMIN D) 2000 units tablet Take 6,000 Units by mouth daily.    [provider]  Collagen-Boron-Hyaluronic Acid ([employer] JOINT HEALTH TRIPLE ACTION PO) Take 1 capsule by mouth daily.    [provider]  docusate sodium (COLACE) 100 MG capsule Take 1 capsule (100 mg total) by mouth 2 (two) times daily. Patient taking differently: Take 100 mg by mouth daily.  01/24/18   Danae Orleans, PA-C  metoprolol succinate (TOPROL-XL) 50 MG 24 hr tablet Take 12.5 mg by mouth daily.  12/18/14   [provider]  simvastatin (ZOCOR) 40 MG tablet Take 40 mg by mouth daily. 12/10/14   [provider]     Allergies:   No Known Allergies  Social History:   Social History   Socioeconomic History  . Marital status: Married    Spouse name: [name]  . Number of children: 1  . Years of education: college  . Highest education level: Not  on file  Occupational History  . Occupation: retired  Tobacco Use  . Smoking status: Never Smoker  . Smokeless tobacco: Never Used  Substance and Sexual Activity  . Alcohol use: No    Alcohol/week: 0.0 standard drinks  . Drug use: No  . Sexual activity: Not on file  Other Topics Concern  . Not on file  Social History Narrative  . Not on file   Social Determinants of Health   Financial Resource Strain:   . Difficulty of Paying Living Expenses: Not on file  Food Insecurity:   . Worried About Charity fundraiser in the Last Year: Not on file  . Ran Out of Food in the Last Year: Not on file  Transportation Needs:    . Lack of Transportation (Medical): Not on file  . Lack of Transportation (Non-Medical): Not on file  Physical Activity:   . Days of Exercise per Week: Not on file  . Minutes of Exercise per Session: Not on file  Stress:   . Feeling of Stress : Not on file  Social Connections:   . Frequency of Communication with Friends and Family: Not on file  . Frequency of Social Gatherings with Friends and Family: Not on file  . Attends Religious Services: Not on file  . Active Member of Clubs or Organizations: Not on file  . Attends Archivist Meetings: Not on file  . Marital Status: Not on file  Intimate Partner Violence:   . Fear of Current or Ex-Partner: Not on file  . Emotionally Abused: Not on file  . Physically Abused: Not on file  . Sexually Abused: Not on file    Family History:   The patient's family history includes Alzheimer's disease in her brother and mother; Diabetes in her mother; Heart attack (age of onset: 27) in her father; Heart disease in her father.    ROS:  Please see the history of present illness.  All other ROS reviewed and negative.     Physical Exam/Data:   Vitals:   11/01/19 1424 11/01/19 1500 11/01/19 1543 11/01/19 1631  BP:  114/75  113/87  Pulse: 88 93  85  Resp: 16 19  20   Temp:   97.8 F (36.6 C) 98.8 F (37.1 C)  TempSrc:   Oral Oral  SpO2: 100% 100%  100%  Weight:    111.4 kg  Height:    5\' 5"  (1.651 m)   No intake or output data in the 24 hours ending 11/01/19 1719 Last 3 Weights 11/01/2019 11/01/2019 11/01/2019  Weight (lbs) 245 lb 9.5 oz 237 lb 237 lb  Weight (kg) 111.4 kg 107.502 kg 107.502 kg     Body mass index is 40.87 kg/m.  General:  Well nourished, well developed, obese older WF, in no acute distress HEENT: normal Lymph: no adenopathy Neck: no JVD Endocrine:  No thryomegaly Vascular: No carotid bruits; FA pulses 2+ bilaterally without bruits  Cardiac:  normal S1, S2; Irreg Irreg; no murmur Lungs:  clear to auscultation  bilaterally Abd: soft, nontender, no hepatomegaly  Ext: no edema Musculoskeletal:  No deformities, BUE and BLE strength normal and equal Skin: warm and dry  Neuro:  CNs 2-12 intact, no focal abnormalities noted Psych:  Normal affect    EKG:  The ECG that was done initially 11/01/19 was personally reviewed and demonstrates Afib RVR with slight ST depression in inferior leads. Follow up showed Afib rate 80s with slight ST elevation in V2.   Relevant  CV Studies:  N/a   Laboratory Data:  High Sensitivity Troponin:   Recent Labs  Lab 11/01/19 1030 11/01/19 1230  TROPONINIHS 1,312* 1,950*      Chemistry Recent Labs  Lab 11/01/19 1030  NA 139  K 3.8  CL 109  CO2 21*  GLUCOSE 109*  BUN 23  CREATININE 1.25*  CALCIUM 9.2  GFRNONAA 42*  GFRAA 49*  ANIONGAP 9    No results for input(s): PROT, ALBUMIN, AST, ALT, ALKPHOS, BILITOT in the last 168 hours. Hematology Recent Labs  Lab 11/01/19 1030  WBC 5.8  RBC 4.72  HGB 14.5  HCT 44.3  MCV 93.9  MCH 30.7  MCHC 32.7  RDW 13.2  PLT 204   BNP Recent Labs  Lab 11/01/19 1029  BNP 410.5*    DDimer No results for input(s): DDIMER in the last 168 hours.   Radiology/Studies:  DG Chest Port 1 View  Result Date: 11/01/2019 CLINICAL DATA:  Shortness of breath.  Diaphoresis. EXAM: PORTABLE CHEST 1 VIEW COMPARISON:  None. FINDINGS: Cardiac silhouette normal in size. No mediastinal or hilar masses. No evidence of adenopathy. Clear lungs.  No pleural effusion or pneumothorax. Skeletal structures are demineralized but grossly intact. IMPRESSION: No active disease. Electronically Signed   By: Lajean Manes M.D.   On: 11/01/2019 10:52   TIMI Risk Score for Unstable Angina or Non-ST Elevation MI:   The patient's TIMI risk score is 4, which indicates a 20% risk of all cause mortality, new or recurrent myocardial infarction or need for urgent revascularization in the next 14 days.   Assessment and Plan:   Melissa Clements is a 74  y.o. female with PMH of HTN, palpitations, CKD, arthritis and sinus bradycardia who presented to the office with chest pain.   1. NSTEMI: developed chest pain while walking yesterday with associated shortness of breath. EKG showed slight depression in inferior leads with follow up noting resolution of depression and isolated ST elevation in v2. No chest pain at this time. hsTn up 1950. -- remains on IV heparin -- plan for cardiac cath tomorrow -- The patient understands that risks included but are not limited to stroke (1 in 1000), death (1 in 1000), kidney failure [usually temporary] (1 in 500), bleeding (1 in 200), allergic reaction [possibly serious] (1 in 200).  -- asa, statin, BB -- trend trops -- check echo  2. New onset Afib: rates were initially higher while at [facility], but now improved on arrival to [facility].  -- continue IV heparin as plan is for cath. Will need DOAC post cath.  -- continue BB, metoprolol 25mg  BID  3. HTN: stable with current therapy  4. HL: on simvastatin prior to admission. Will switch to Lipitor 80mg  daily. -- check lipids   Severity of Illness: The appropriate patient status for this patient is INPATIENT. Inpatient status is judged to be reasonable and necessary in order to provide the required intensity of service to ensure the patient's safety. The patient's presenting symptoms, physical exam findings, and initial radiographic and laboratory data in the context of their chronic comorbidities is felt to place them at high risk for further clinical deterioration. Furthermore, it is not anticipated that the patient will be medically stable for discharge from the hospital within 2 midnights of admission. The following factors support the patient status of inpatient.   " The patient's presenting symptoms include chest pain, shortness of breath. " The worrisome physical exam findings include stable exam. " The initial radiographic  and laboratory data are worrisome because  of elevated troponins, abnormal ekg. " The chronic co-morbidities include HTN, HL.   * I certify that at the point of admission it is my clinical judgment that the patient will require inpatient hospital care spanning beyond 2 midnights from the point of admission due to high intensity of service, high risk for further deterioration and high frequency of surveillance required.*    For questions or updates, please contact [place] Please consult [email or web address] for contact info under   Signed, Reino Bellis, NP  11/01/2019 5:19 PM

## 2019-11-01 NOTE — Plan of Care (Signed)
  Problem: Clinical Measurements: Goal: Diagnostic test results will improve Outcome: Progressing Goal: Respiratory complications will improve Outcome: Progressing   Problem: Nutrition: Goal: Adequate nutrition will be maintained Outcome: Progressing   Problem: Coping: Goal: Level of anxiety will decrease Outcome: Progressing

## 2019-11-01 NOTE — Progress Notes (Signed)
Received critical troponin of 1434. Patient asymptomatic. Will continue to monitor

## 2019-11-01 NOTE — ED Notes (Signed)
Melina Copa ED MD spoke with O'Neal MD who reports pt will be cathed tomorrow. ED MD advises pt can eat at this time.

## 2019-11-01 NOTE — ED Notes (Signed)
Date and time results received: 11/01/19 1442   Test: trop Critical Value: 1950  Name of Provider Notified: Melina Copa  Orders Received? Or Actions Taken?: no orders given

## 2019-11-01 NOTE — Progress Notes (Signed)
ANTICOAGULATION CONSULT NOTE - Follow-Up Consult  Pharmacy Consult for heparin Indication: chest pain/ACS  No Known Allergies  Patient Measurements: Height: 5\' 5"  (165.1 cm) Weight: 245 lb 9.5 oz (111.4 kg) IBW/kg (Calculated) : 57 Heparin Dosing Weight: 79.1kg  Vital Signs: Temp: 98.8 F (37.1 C) (01/28 1631) Temp Source: Oral (01/28 1631) BP: 113/87 (01/28 1631) Pulse Rate: 85 (01/28 1631)  Labs: Recent Labs    11/01/19 1030 11/01/19 1230 11/01/19 1854  HGB 14.5  --   --   HCT 44.3  --   --   PLT 204  --   --   APTT 28  --   --   LABPROT 13.7  --   --   INR 1.1  --   --   HEPARINUNFRC  --   --  0.79*  CREATININE 1.25*  --   --   TROPONINIHS 1,312* 1,950*  --     Estimated Creatinine Clearance: 49.1 mL/min (A) (by C-G formula based on SCr of 1.25 mg/dL (H)).   Medications:  Infusions:  . sodium chloride 10 mL/hr at 11/01/19 1104  . sodium chloride    . [START ON 11/02/2019] sodium chloride     Followed by  . [START ON 11/02/2019] sodium chloride    . heparin 1,000 Units/hr (11/01/19 1117)    Assessment: 74 yof presented to the ED with CP and found to have a NSTEMI + new onset Afib. Pharmacy was consulted to dose heparin for anticoagulation.  The patient's heparin level this evening resulted as SUPRAtherapeutic (HL 0.79, goal of 0.3-0.7). No bleeding or issues noted per RN.   Goal of Therapy:  Heparin level 0.3-0.7 units/ml Monitor platelets by anticoagulation protocol: Yes   Plan:  - Reduce Heparin to 900 units/hr (9 ml/hr) - Will continue to monitor for any signs/symptoms of bleeding and will follow up with heparin level in 8 hours  Thank you for allowing pharmacy to be a part of this patient's care.  Alycia Rossetti, PharmD, BCPS Clinical Pharmacist Clinical phone for 11/01/2019: [phone] 11/01/2019 7:55 PM   **Pharmacist phone directory can now be found on [email or web address] ([email or web address]).  Listed under [place].

## 2019-11-01 NOTE — ED Triage Notes (Signed)
Pt sent from Dr. Wendy Poet office.  Pt had c/o of chest in office with some sob and diaphoresis.  Pain had gone away but EKG showed rate change and other changes.   HR variable from low 100s to 130s.

## 2019-11-01 NOTE — Progress Notes (Signed)
ANTICOAGULATION CONSULT NOTE - Initial Consult  Pharmacy Consult for heparin Indication: chest pain/ACS  No Known Allergies  Patient Measurements:   Heparin Dosing Weight: 79.1kg  Vital Signs: BP: 131/78 (01/28 1042) Pulse Rate: 110 (01/28 1042)  Labs: Recent Labs    11/01/19 1030  HGB 14.5  HCT 44.3  PLT 204  APTT 28  LABPROT 13.7  INR 1.1    CrCl cannot be calculated (Patient's most recent lab result is older than the maximum 21 days allowed.).   Medical History: Past Medical History:  Diagnosis Date  . Arthritis    end stage right hip  . Basal cell carcinoma   . Chronic kidney disease    stage III, related to high blood pressure medication  . Colon polyp   . Depression   . History of chest pain   . History of dizziness   . History of palpitations   . Hyperlipidemia   . Hypertension     Medications:  Infusions:  . sodium chloride    . heparin      Assessment: 13 yof presented to the ED with CP. To start IV heparin. Baseline CBC is WNL and she is not on anticoagulation PTA.   Goal of Therapy:  Heparin level 0.3-0.7 units/ml Monitor platelets by anticoagulation protocol: Yes   Plan:  Heparin bolus 4000 units IV x 1 Heparin gtt 1000 units/hr Check an 8 hr heparin level Daily heparin level and CBC  Shareena Nusz, Rande Lawman 11/01/2019,10:40 AM

## 2019-11-02 ENCOUNTER — Encounter (HOSPITAL_COMMUNITY)
Admission: EM | Disposition: A | Discharge status: Home / Self Care | Payer: Self-pay | Source: Home / Self Care | Attending: Cardiovascular Disease

## 2019-11-02 ENCOUNTER — Inpatient Hospital Stay (HOSPITAL_COMMUNITY): Payer: Medicare Other

## 2019-11-02 DIAGNOSIS — I2510 Atherosclerotic heart disease of native coronary artery without angina pectoris: Secondary | ICD-10-CM

## 2019-11-02 DIAGNOSIS — R079 Chest pain, unspecified: Secondary | ICD-10-CM

## 2019-11-02 DIAGNOSIS — Z9861 Coronary angioplasty status: Secondary | ICD-10-CM | POA: Insufficient documentation

## 2019-11-02 DIAGNOSIS — Z955 Presence of coronary angioplasty implant and graft: Secondary | ICD-10-CM | POA: Insufficient documentation

## 2019-11-02 DIAGNOSIS — I4819 Other persistent atrial fibrillation: Secondary | ICD-10-CM

## 2019-11-02 HISTORY — PX: CORONARY STENT INTERVENTION: CATH118234

## 2019-11-02 HISTORY — PX: LEFT HEART CATH AND CORONARY ANGIOGRAPHY: CATH118249

## 2019-11-02 LAB — LIPID PANEL
Cholesterol: 181 mg/dL (ref 0–200)
Cholesterol: 153 mg/dL (ref 0–200)
HDL: 66 mg/dL (ref >40)
HDL: 58 mg/dL (ref >40)
LDL Cholesterol: 84 mg/dL (ref 0–99)
Total CHOL/HDL Ratio: 2.7 RATIO
Total CHOL/HDL Ratio: 2.6 RATIO
Triglycerides: 78 mg/dL (ref <150)
Triglycerides: 55 mg/dL (ref <150)
VLDL: 16 mg/dL (ref 0–40)
VLDL: 11 mg/dL (ref 0–40)

## 2019-11-02 LAB — BASIC METABOLIC PANEL
Anion gap: 10 (ref 5–15)
BUN: 22 mg/dL (ref 8–23)
CO2: 24 mmol/L (ref 22–32)
Calcium: 8.7 mg/dL — ABNORMAL LOW (ref 8.9–10.3)
Chloride: 105 mmol/L (ref 98–111)
Creatinine, Ser: 1.43 mg/dL — ABNORMAL HIGH (ref 0.44–1.00)
GFR calc Af Amer: 42 mL/min — ABNORMAL LOW (ref >60)
GFR calc non Af Amer: 36 mL/min — ABNORMAL LOW (ref >60)
Glucose, Bld: 97 mg/dL (ref 70–99)
Potassium: 4 mmol/L (ref 3.5–5.1)
Sodium: 139 mmol/L (ref 135–145)

## 2019-11-02 LAB — HEPARIN LEVEL (UNFRACTIONATED): Heparin Unfractionated: 0.66 IU/mL (ref 0.30–0.70)

## 2019-11-02 LAB — ECHOCARDIOGRAM COMPLETE
Height: 65 in
Weight: 3862.46 oz

## 2019-11-02 LAB — CBC
HCT: 42.2 % (ref 36.0–46.0)
Hemoglobin: 13.7 g/dL (ref 12.0–15.0)
MCH: 30.4 pg (ref 26.0–34.0)
MCHC: 32.5 g/dL (ref 30.0–36.0)
MCV: 93.6 fL (ref 80.0–100.0)
Platelets: 194 10*3/uL (ref 150–400)
RBC: 4.51 MIL/uL (ref 3.87–5.11)
RDW: 13.3 % (ref 11.5–15.5)
WBC: 6 10*3/uL (ref 4.0–10.5)
nRBC: 0 % (ref 0.0–0.2)

## 2019-11-02 SURGERY — LEFT HEART CATH AND CORONARY ANGIOGRAPHY
Anesthesia: LOCAL

## 2019-11-02 MED ORDER — MIDAZOLAM HCL 2 MG/2ML IJ SOLN
INTRAMUSCULAR | Status: DC | PRN
  Administered 2019-11-02 (×2): 1 mg via INTRAVENOUS

## 2019-11-02 MED ORDER — CLOPIDOGREL BISULFATE 300 MG PO TABS
ORAL_TABLET | ORAL | Status: AC
  Filled 2019-11-02: qty 2

## 2019-11-02 MED ORDER — HEPARIN SODIUM (PORCINE) 1000 UNIT/ML IJ SOLN
INTRAMUSCULAR | Status: AC
  Filled 2019-11-02: qty 1

## 2019-11-02 MED ORDER — CLOPIDOGREL BISULFATE 75 MG PO TABS
75.0000 mg | ORAL_TABLET | Freq: Every day | ORAL | Status: DC
  Administered 2019-11-03: 75 mg via ORAL
  Filled 2019-11-02: qty 1

## 2019-11-02 MED ORDER — SODIUM CHLORIDE 0.9 % IV SOLN: INTRAVENOUS | Status: AC

## 2019-11-02 MED ORDER — VERAPAMIL HCL 2.5 MG/ML IV SOLN
INTRAVENOUS | Status: DC | PRN
  Administered 2019-11-02: 15:00:00 10 mL via INTRA_ARTERIAL

## 2019-11-02 MED ORDER — HEPARIN (PORCINE) 25000 UT/250ML-% IV SOLN
900.0000 [IU]/h | INTRAVENOUS | Status: AC
  Administered 2019-11-03: 900 [IU]/h via INTRAVENOUS
  Filled 2019-11-02 (×2): qty 250

## 2019-11-02 MED ORDER — HEPARIN SODIUM (PORCINE) 1000 UNIT/ML IJ SOLN
INTRAMUSCULAR | Status: DC | PRN
  Administered 2019-11-02: 6000 [IU] via INTRAVENOUS
  Administered 2019-11-02: 5000 [IU] via INTRAVENOUS

## 2019-11-02 MED ORDER — IOHEXOL 350 MG/ML SOLN
INTRAVENOUS | Status: DC | PRN
  Administered 2019-11-02: 16:00:00 130 mL

## 2019-11-02 MED ORDER — VERAPAMIL HCL 2.5 MG/ML IV SOLN
INTRAVENOUS | Status: AC
  Filled 2019-11-02: qty 2

## 2019-11-02 MED ORDER — FENTANYL CITRATE (PF) 100 MCG/2ML IJ SOLN
INTRAMUSCULAR | Status: DC | PRN
  Administered 2019-11-02: 25 ug via INTRAVENOUS
  Administered 2019-11-02 (×2): 50 ug via INTRAVENOUS
  Administered 2019-11-02: 25 ug via INTRAVENOUS

## 2019-11-02 MED ORDER — LABETALOL HCL 5 MG/ML IV SOLN: 10.0000 mg | INTRAVENOUS | Status: AC | PRN

## 2019-11-02 MED ORDER — HYDRALAZINE HCL 20 MG/ML IJ SOLN: 10.0000 mg | INTRAMUSCULAR | Status: AC | PRN

## 2019-11-02 MED ORDER — RIVAROXABAN 20 MG PO TABS: 20.0000 mg | ORAL_TABLET | Freq: Every day | ORAL | Status: DC

## 2019-11-02 MED ORDER — RIVAROXABAN 15 MG PO TABS
15.0000 mg | ORAL_TABLET | Freq: Every day | ORAL | Status: DC
  Filled 2019-11-02: qty 1

## 2019-11-02 MED ORDER — PERFLUTREN LIPID MICROSPHERE
1.0000 mL | INTRAVENOUS | Status: AC | PRN
  Administered 2019-11-02: 2 mL via INTRAVENOUS
  Filled 2019-11-02: qty 10

## 2019-11-02 MED ORDER — HEPARIN (PORCINE) IN NACL 1000-0.9 UT/500ML-% IV SOLN
INTRAVENOUS | Status: DC | PRN
  Administered 2019-11-02 (×2): 500 mL

## 2019-11-02 MED ORDER — MORPHINE SULFATE (PF) 2 MG/ML IV SOLN: 1.0000 mg | INTRAVENOUS | Status: DC | PRN

## 2019-11-02 MED ORDER — NITROGLYCERIN 1 MG/10 ML FOR IR/CATH LAB
INTRA_ARTERIAL | Status: DC | PRN
  Administered 2019-11-02 (×3): 200 ug via INTRACORONARY

## 2019-11-02 MED ORDER — SODIUM CHLORIDE 0.9% FLUSH
3.0000 mL | Freq: Two times a day (BID) | INTRAVENOUS | Status: DC
  Administered 2019-11-03: 3 mL via INTRAVENOUS

## 2019-11-02 MED ORDER — NITROGLYCERIN 1 MG/10 ML FOR IR/CATH LAB
INTRA_ARTERIAL | Status: AC
  Filled 2019-11-02: qty 10

## 2019-11-02 MED ORDER — SODIUM CHLORIDE 0.9% FLUSH: 3.0000 mL | INTRAVENOUS | Status: DC | PRN

## 2019-11-02 MED ORDER — MIDAZOLAM HCL 2 MG/2ML IJ SOLN
INTRAMUSCULAR | Status: AC
  Filled 2019-11-02: qty 2

## 2019-11-02 MED ORDER — FENTANYL CITRATE (PF) 100 MCG/2ML IJ SOLN
INTRAMUSCULAR | Status: AC
  Filled 2019-11-02: qty 2

## 2019-11-02 MED ORDER — SODIUM CHLORIDE 0.9 % IV SOLN: 250.0000 mL | INTRAVENOUS | Status: DC | PRN

## 2019-11-02 MED ORDER — LIDOCAINE HCL (PF) 1 % IJ SOLN
INTRAMUSCULAR | Status: DC | PRN
  Administered 2019-11-02: 2 mL via INTRADERMAL

## 2019-11-02 MED ORDER — HEPARIN (PORCINE) IN NACL 1000-0.9 UT/500ML-% IV SOLN
INTRAVENOUS | Status: AC
  Filled 2019-11-02: qty 1000

## 2019-11-02 MED ORDER — CLOPIDOGREL BISULFATE 300 MG PO TABS
ORAL_TABLET | ORAL | Status: DC | PRN
  Administered 2019-11-02: 600 mg via ORAL

## 2019-11-02 MED ORDER — LIDOCAINE HCL (PF) 1 % IJ SOLN
INTRAMUSCULAR | Status: AC
  Filled 2019-11-02: qty 30

## 2019-11-02 SURGICAL SUPPLY — 23 items
BALLN SAPPHIRE 2.5X15 (BALLOONS) ×2
BALLN SAPPHIRE ~~LOC~~ 3.5X15 (BALLOONS) ×1 IMPLANT
BALLN ~~LOC~~ EMERGE MR 3.5X6 (BALLOONS) ×2
BALLOON SAPPHIRE 2.5X15 (BALLOONS) IMPLANT
BALLOON ~~LOC~~ EMERGE MR 3.5X6 (BALLOONS) IMPLANT
BRACE RADIAL COMPRESSION RADST (HEMOSTASIS) ×1 IMPLANT
CATH OPTITORQUE TIG 4.0 5F (CATHETERS) ×1 IMPLANT
CATH VISTA GUIDE 6FR XBLAD3.5 (CATHETERS) ×1 IMPLANT
DEVICE RAD COMP TR BAND LRG (VASCULAR PRODUCTS) ×1 IMPLANT
GLIDESHEATH SLEND SS 6F .021 (SHEATH) ×1 IMPLANT
GUIDEWIRE INQWIRE 1.5J.035X260 (WIRE) IMPLANT
INQWIRE 1.5J .035X260CM (WIRE) ×2
KIT ENCORE 26 ADVANTAGE (KITS) ×1 IMPLANT
KIT HEART LEFT (KITS) ×2 IMPLANT
PACK CARDIAC CATHETERIZATION (CUSTOM PROCEDURE TRAY) ×2 IMPLANT
SHEATH PROBE COVER 6X72 (BAG) ×2 IMPLANT
STENT SYNERGY XD 3.0X20 (Permanent Stent) IMPLANT
SYNERGY XD 3.0X20 (Permanent Stent) ×2 IMPLANT
TRANSDUCER W/STOPCOCK (MISCELLANEOUS) ×2 IMPLANT
TUBING CIL FLEX 10 FLL-RA (TUBING) ×2 IMPLANT
WIRE ASAHI PROWATER 180CM (WIRE) ×1 IMPLANT
WIRE HI TORQ BMW 190CM (WIRE) ×1 IMPLANT
WIRE HI TORQ VERSACORE-J 145CM (WIRE) ×1 IMPLANT

## 2019-11-02 NOTE — Brief Op Note (Signed)
11/02/2019  4:18 PM  PATIENT:  Melissa Clements  74 y.o. female  PRE-OPERATIVE DIAGNOSIS:  nonstemi  POST-OPERATIVE DIAGNOSIS: 90% proximal LAD apple core lesion as culprit with successful DES PCI using Synergy DES 3.0 mm x 20 mm postdilated to 3.6 mm (focal area would not fully expand to the full diameter, but otherwise well expanded) Otherwise normal coronaries with a Left Dominant System and moderate size Ramus Intermedius -with no notable disease other than focal lesion. Relative normal LVEDP of 8-11 mmHg  PROCEDURE:  Procedure(s): LEFT HEART CATH AND CORONARY ANGIOGRAPHY (N/A) CORONARY STENT INTERVENTION (N/A) -> Right radial artery access, 6 French sheath using ultrasound guidance, Seldinger technique.  -> TIG 4.0 catheter for left and right coronary artery cineangiography, and LV hemodynamics --> PCI: 6 [place] XB LAD guide catheter, Prowater wire, 2.5 mm x 50 mm predilation balloon, --> synergy DES 3.0 mm x 20 mm postdilated to 3.6 mm with 3.5 mm x 6 mm [place] balloon with focal waist in the mid stent. --> 600 Plavix, 11,000 total heparin; 200 mcg IC NTG x 3  SURGEON:  Surgeon(s) and Role:    * Leonie Man, MD - Primary  ANESTHESIA:   IV sedation 2 mg IV Versed, 150 mcg IV fentanyl; 3 mL subcu lidocaine  EBL:  < 20 mL   MEDICATIONS USED: 130 mL contrast, total 11,000 heparin, 600 p.o. Plavix, 250 mL bolus NS  SPECIMEN: Blood samples for ACT  COUNTS:  YES  TR BAND: 1600 hrs., 20 mL  DICTATION: .Note written in EPIC  PLAN OF CARE: Patient will be transferred to 6 E. for ongoing care.  Restart IV heparin 8 hours after sheath removal with plan to start Xarelto in the morning and stop aspirin  Appears to be relatively euvolemic with LVEDP of 11, will gently hydrate  Needs aggressive cardiomyopathy and A. fib management per primary team   PATIENT DISPOSITION:  PACU - hemodynamically stable.   Delay start of Pharmacological VTE agent (>24hrs) due to surgical blood loss or  risk of bleeding: not applicable   Glenetta Hew, MD

## 2019-11-02 NOTE — Progress Notes (Signed)
ANTICOAGULATION CONSULT NOTE - Follow-Up Consult  Pharmacy Consult for heparin Indication: chest pain/ACS + new onset AFib  No Known Allergies  Patient Measurements: Height: 5\' 5"  (165.1 cm) Weight: 241 lb 6.5 oz (109.5 kg) IBW/kg (Calculated) : 57 Heparin Dosing Weight: 79.1kg  Vital Signs: Temp: 98.6 F (37 C) (01/29 1147) Temp Source: Oral (01/29 1147) BP: 133/85 (01/29 1555) Pulse Rate: 106 (01/29 1555)  Labs: Recent Labs    11/01/19 1030 11/01/19 1030 11/01/19 1230 11/01/19 1854 11/01/19 2002 11/01/19 2144 11/02/19 0344  HGB 14.5  --   --   --   --   --  13.7  HCT 44.3  --   --   --   --   --  42.2  PLT 204  --   --   --   --   --  194  APTT 28  --   --   --   --   --   --   LABPROT 13.7  --   --   --   --   --   --   INR 1.1  --   --   --   --   --   --   HEPARINUNFRC  --   --   --  0.79*  --   --  0.66  CREATININE 1.25*  --   --   --   --   --  1.43*  TROPONINIHS 1,312*   < > 1,950*  --  1,434* 1,170*  --    < > = values in this interval not displayed.    Estimated Creatinine Clearance: 42.5 mL/min (A) (by C-G formula based on SCr of 1.43 mg/dL (H)).   Assessment: 1 yof presented to the ED with CP and found to have a NSTEMI + new onset Afib.   S/p cath lab - sheath removal at 1600 (hep to begin 8 hrs after) Xarelto to start in am for afib  CHA2DS2/VAS Stroke Risk Points  Current as of 16 minutes ago     3 >= 2 Points: High Risk  1 - 1.99 Points: Medium Risk  0 Points: Low Risk    The patient's score has not changed in the past year.: No Change     Details    This score determines the patient's risk of having a stroke if the  patient has atrial fibrillation.       Points Metrics  0 Has Congestive Heart Failure:  No    Current as of 16 minutes ago  0 Has Vascular Disease:  No    Current as of 16 minutes ago  1 Has Hypertension:  Yes    Current as of 16 minutes ago  1 Age:  74    Current as of 16 minutes ago  0 Has Diabetes:  No    Current  as of 16 minutes ago  0 Had Stroke:  No  Had TIA:  No  Had thromboembolism:  No    Current as of 16 minutes ago  1 Female:  Yes    Current as of 16 minutes ago     Goal of Therapy:  Heparin level 0.3-0.7 units/ml Monitor platelets by anticoagulation protocol: Yes   Plan:  Resume heparin 900 units/hr at 0000 Stop 0800 1/30 and start xarelto 15 mg daily No need for heparin lvls  Barth Kirks, PharmD, BCPS, BCCCP Clinical Pharmacist [phone]  Please check AMION for all [facility] numbers  11/02/2019 4:38  PM

## 2019-11-02 NOTE — Progress Notes (Signed)
Pt arrived to rm with radstat on arm. Pt has hematoma. Tech from cath lab holding pressure. Bryan from cath lab called to assess. He adjusted radstat and applied pressure. Carroll Kinds RN

## 2019-11-02 NOTE — Progress Notes (Signed)
  Echocardiogram 2D Echocardiogram has been performed.  Jennette Dubin 11/02/2019, 11:02 AM

## 2019-11-02 NOTE — H&P (View-Only) (Signed)
Progress Note  Patient Name: Melissa Clements Date of Encounter: 11/02/2019  Primary Cardiologist: Jenne Campus, MD   Subjective   Planned for cath today>>>creatinine went from 1.2 to 1.4 likely in the setting of Lasix dose 1/28>>>will plan to continue with IVF hydration. Creatinine not far off from baseline   Inpatient Medications    Scheduled Meds: . [START ON 11/03/2019] aspirin EC  81 mg Oral Daily  . atorvastatin  80 mg Oral Once  . atorvastatin  80 mg Oral q1800  . metoprolol tartrate  25 mg Oral BID  . sodium chloride flush  3 mL Intravenous Q12H   Continuous Infusions: . sodium chloride 10 mL/hr at 11/01/19 1104  . sodium chloride    . sodium chloride 1 mL/kg/hr (11/02/19 0520)  . heparin 900 Units/hr (11/01/19 2019)   PRN Meds: sodium chloride, acetaminophen, nitroGLYCERIN, ondansetron (ZOFRAN) IV, sodium chloride flush   Vital Signs    Vitals:   11/02/19 0028 11/02/19 0300 11/02/19 0350 11/02/19 0403  BP: 113/82  (!) 99/57   Pulse: 93  92   Resp: (!) 31 13 (!) 27   Temp: 98.5 F (36.9 C)  98.4 F (36.9 C)   TempSrc: Oral  Oral   SpO2: 100% 99% 97%   Weight:    109.5 kg  Height:        Intake/Output Summary (Last 24 hours) at 11/02/2019 0802 Last data filed at 11/01/2019 2254 Gross per 24 hour  Intake 157.99 ml  Output --  Net 157.99 ml   Filed Weights   11/01/19 1046 11/01/19 1631 11/02/19 0403  Weight: 107.5 kg 111.4 kg 109.5 kg    Physical Exam   General: Well developed, well nourished, NAD Neck: Negative for carotid bruits. No JVD Lungs:Clear to ausculation bilaterally. No wheezes, rales, or rhonchi. Breathing is unlabored. Cardiovascular: RRR with S1 S2. No murmurs, rubs, gallops, or LV heave appreciated. Abdomen: Soft, non-tender, non-distended. No obvious abdominal masses. Extremities: No edema. DP/PT pulses 2+ bilaterally Neuro: Alert and oriented. No focal deficits. No facial asymmetry. MAE spontaneously. Psych: Responds to  questions appropriately with normal affect.    Labs    Chemistry Recent Labs  Lab 11/01/19 1030 11/02/19 0344  NA 139 139  K 3.8 4.0  CL 109 105  CO2 21* 24  GLUCOSE 109* 97  BUN 23 22  CREATININE 1.25* 1.43*  CALCIUM 9.2 8.7*  GFRNONAA 42* 36*  GFRAA 49* 42*  ANIONGAP 9 10     Hematology Recent Labs  Lab 11/01/19 1030 11/02/19 0344  WBC 5.8 6.0  RBC 4.72 4.51  HGB 14.5 13.7  HCT 44.3 42.2  MCV 93.9 93.6  MCH 30.7 30.4  MCHC 32.7 32.5  RDW 13.2 13.3  PLT 204 194    Cardiac EnzymesNo results for input(s): TROPONINI in the last 168 hours. No results for input(s): TROPIPOC in the last 168 hours.   BNP Recent Labs  Lab 11/01/19 1029  BNP 410.5*     DDimer No results for input(s): DDIMER in the last 168 hours.   Radiology    DG Chest Port 1 View  Result Date: 11/01/2019 CLINICAL DATA:  Shortness of breath.  Diaphoresis. EXAM: PORTABLE CHEST 1 VIEW COMPARISON:  None. FINDINGS: Cardiac silhouette normal in size. No mediastinal or hilar masses. No evidence of adenopathy. Clear lungs.  No pleural effusion or pneumothorax. Skeletal structures are demineralized but grossly intact. IMPRESSION: No active disease. Electronically Signed   By: Dedra Skeens.D.  On: 11/01/2019 10:52   Telemetry    11/02/19 ST with HR in the low 100's- Personally Reviewed  ECG    No new tracing as of 11/02/19 - Personally Reviewed  Cardiac Studies   Planned LHC today 11/02/19  Patient Profile     74 y.o. female with a PMH of HTN, palpitations, CKD Stage II, arthritis and sinus bradycardia who presented to the office with chest pain.   Assessment & Plan    1. NSTEMI:  -Pt presented with exertional CP while walking 10/31/19 with associated SOB>>>EKG showed isolate ST elevation in V2 without contiguous ST elevation in other leads and no evidence of ST depression in reciprocal leads.  -Continue Hep infusion today>>planned LHC  -Continue ASA, statin, BB -Creatinine today, 1.43>>up  from 1.2 yesterday>>will plan to continue IVF and minimize contrast dye during procedure and hydrate adequately in the post cath setting  -HsT 1434>>1170 -Denies recurrent symptoms  -Pending echo today   2. New onset Afib:  -HR more controlled today>>>80-90's  -Continue BB>>may need further titration in the post cath setting given elevated HR however limited given soft BPs>>asymptomatic  -On IV heparin as plan is for cath -Will need DOAC post cath  3. HTN:  -Stable with current therapy  4. HL:  -On simvastatin prior to admission>>transisiton to Lipitor 80mg  daily -LDL, 84 on 11/02/19   SignedKathyrn Drown NP-C [facility] Pager: [phone] 11/02/2019, 8:02 AM     For questions or updates, please contact   Please consult [email or web address] for contact info under Cardiology/STEMI.

## 2019-11-02 NOTE — Progress Notes (Signed)
ANTICOAGULATION CONSULT NOTE - Follow-Up Consult  Pharmacy Consult for heparin Indication: chest pain/ACS + new onset AFib  No Known Allergies  Patient Measurements: Height: 5\' 5"  (165.1 cm) Weight: 241 lb 6.5 oz (109.5 kg) IBW/kg (Calculated) : 57 Heparin Dosing Weight: 79.1kg  Vital Signs: Temp: 98.4 F (36.9 C) (01/29 0350) Temp Source: Oral (01/29 0350) BP: 99/57 (01/29 0350) Pulse Rate: 92 (01/29 0350)  Labs: Recent Labs    11/01/19 1030 11/01/19 1030 11/01/19 1230 11/01/19 1854 11/01/19 2002 11/01/19 2144 11/02/19 0344  HGB 14.5  --   --   --   --   --  13.7  HCT 44.3  --   --   --   --   --  42.2  PLT 204  --   --   --   --   --  194  APTT 28  --   --   --   --   --   --   LABPROT 13.7  --   --   --   --   --   --   INR 1.1  --   --   --   --   --   --   HEPARINUNFRC  --   --   --  0.79*  --   --  0.66  CREATININE 1.25*  --   --   --   --   --  1.43*  TROPONINIHS 1,312*   < > 1,950*  --  1,434* 1,170*  --    < > = values in this interval not displayed.    Estimated Creatinine Clearance: 42.5 mL/min (A) (by C-G formula based on SCr of 1.43 mg/dL (H)).   Medications:  Infusions:  . sodium chloride 10 mL/hr at 11/01/19 1104  . sodium chloride    . sodium chloride 1 mL/kg/hr (11/02/19 0520)  . heparin 900 Units/hr (11/01/19 2019)    Assessment: 42 yof presented to the ED with CP and found to have a NSTEMI + new onset Afib. Pharmacy was consulted to dose heparin for anticoagulation.  Heparin level this morning came back therapeutic at 0.66, on 900 units/hr. CBC stable. No s/sx of bleeding or infusion issues.  Goal of Therapy:  Heparin level 0.3-0.7 units/ml Monitor platelets by anticoagulation protocol: Yes   Plan:  - Continue heparin gtt at 900 units/hr (9 ml/hr) - Monitor daily HL, CBC, and for s/sx of bleeding -F/u after cath planned for 1/29  Thank you for allowing pharmacy to be a part of this patient's care.  Antonietta Jewel, PharmD,  BCCCP Clinical Pharmacist  Phone: [phone]  Please check AMION for all [facility] phone numbers After 10:00 PM, call [facility] [phone] 11/02/2019 7:18 AM

## 2019-11-02 NOTE — Progress Notes (Signed)
Radstat band removed, tegaderm dressing applied. No change to site. Level 2 hematoma present proximal to stick site. Spo2 99% as measured in right thumb. Brisk capillary refill present.  Bedrest instructions given.   Band was removed at 20:00:00

## 2019-11-02 NOTE — Progress Notes (Signed)
Radstat band loosened another 58mm. No s+s of additional hematoma. Site unchanged.

## 2019-11-02 NOTE — Interval H&P Note (Signed)
History and Physical Interval Note:  11/02/2019 2:28 PM  Melissa Clements  has presented today for surgery, with the diagnosis of nonstemi.  The various methods of treatment have been discussed with the patient and family. After consideration of risks, benefits and other options for treatment, the patient has consented to  Procedure(s): LEFT HEART CATH AND CORONARY ANGIOGRAPHY (N/A) PERCUTANEOUS CORONARY INTERVENTION    as a surgical intervention.  The patient's history has been reviewed, patient examined, no change in status, stable for surgery.  I have reviewed the patient's chart and labs.  Questions were answered to the patient's satisfaction.    Cath Lab Visit (complete for each Cath Lab visit)  Clinical Evaluation Leading to the Procedure:   ACS: Yes.    Non-ACS:    Anginal Classification: CCS III  Anti-ischemic medical therapy: Minimal Therapy (1 class of medications)  Non-Invasive Test Results: No non-invasive testing performed  Prior CABG: No previous CABG   Glenetta Hew

## 2019-11-02 NOTE — Progress Notes (Signed)
Radstat arm band loosened per protocol. Ecchymosis and bruising present proximal and distal to stick site. Area of hematoma proximal to stick site softer but present about 3 inches wide extending just shy of the elbow. spo2 99% as measured in right thumb.   Site level 2, no changes after loosening of band. Brisk capillary refill in nail beds.

## 2019-11-02 NOTE — Progress Notes (Signed)
Progress Note  Patient Name: Melissa Clements Date of Encounter: 11/02/2019  Primary Cardiologist: Jenne Campus, MD   Subjective   Planned for cath today>>>creatinine went from 1.2 to 1.4 likely in the setting of Lasix dose 1/28>>>will plan to continue with IVF hydration. Creatinine not far off from baseline   Inpatient Medications    Scheduled Meds: . [START ON 11/03/2019] aspirin EC  81 mg Oral Daily  . atorvastatin  80 mg Oral Once  . atorvastatin  80 mg Oral q1800  . metoprolol tartrate  25 mg Oral BID  . sodium chloride flush  3 mL Intravenous Q12H   Continuous Infusions: . sodium chloride 10 mL/hr at 11/01/19 1104  . sodium chloride    . sodium chloride 1 mL/kg/hr (11/02/19 0520)  . heparin 900 Units/hr (11/01/19 2019)   PRN Meds: sodium chloride, acetaminophen, nitroGLYCERIN, ondansetron (ZOFRAN) IV, sodium chloride flush   Vital Signs    Vitals:   11/02/19 0028 11/02/19 0300 11/02/19 0350 11/02/19 0403  BP: 113/82  (!) 99/57   Pulse: 93  92   Resp: (!) 31 13 (!) 27   Temp: 98.5 F (36.9 C)  98.4 F (36.9 C)   TempSrc: Oral  Oral   SpO2: 100% 99% 97%   Weight:    109.5 kg  Height:        Intake/Output Summary (Last 24 hours) at 11/02/2019 0802 Last data filed at 11/01/2019 2254 Gross per 24 hour  Intake 157.99 ml  Output --  Net 157.99 ml   Filed Weights   11/01/19 1046 11/01/19 1631 11/02/19 0403  Weight: 107.5 kg 111.4 kg 109.5 kg    Physical Exam   General: Well developed, well nourished, NAD Neck: Negative for carotid bruits. No JVD Lungs:Clear to ausculation bilaterally. No wheezes, rales, or rhonchi. Breathing is unlabored. Cardiovascular: RRR with S1 S2. No murmurs, rubs, gallops, or LV heave appreciated. Abdomen: Soft, non-tender, non-distended. No obvious abdominal masses. Extremities: No edema. DP/PT pulses 2+ bilaterally Neuro: Alert and oriented. No focal deficits. No facial asymmetry. MAE spontaneously. Psych: Responds to  questions appropriately with normal affect.    Labs    Chemistry Recent Labs  Lab 11/01/19 1030 11/02/19 0344  NA 139 139  K 3.8 4.0  CL 109 105  CO2 21* 24  GLUCOSE 109* 97  BUN 23 22  CREATININE 1.25* 1.43*  CALCIUM 9.2 8.7*  GFRNONAA 42* 36*  GFRAA 49* 42*  ANIONGAP 9 10     Hematology Recent Labs  Lab 11/01/19 1030 11/02/19 0344  WBC 5.8 6.0  RBC 4.72 4.51  HGB 14.5 13.7  HCT 44.3 42.2  MCV 93.9 93.6  MCH 30.7 30.4  MCHC 32.7 32.5  RDW 13.2 13.3  PLT 204 194    Cardiac EnzymesNo results for input(s): TROPONINI in the last 168 hours. No results for input(s): TROPIPOC in the last 168 hours.   BNP Recent Labs  Lab 11/01/19 1029  BNP 410.5*     DDimer No results for input(s): DDIMER in the last 168 hours.   Radiology    DG Chest Port 1 View  Result Date: 11/01/2019 CLINICAL DATA:  Shortness of breath.  Diaphoresis. EXAM: PORTABLE CHEST 1 VIEW COMPARISON:  None. FINDINGS: Cardiac silhouette normal in size. No mediastinal or hilar masses. No evidence of adenopathy. Clear lungs.  No pleural effusion or pneumothorax. Skeletal structures are demineralized but grossly intact. IMPRESSION: No active disease. Electronically Signed   By: Dedra Skeens.D.  On: 11/01/2019 10:52   Telemetry    11/02/19 ST with HR in the low 100's- Personally Reviewed  ECG    No new tracing as of 11/02/19 - Personally Reviewed  Cardiac Studies   Planned LHC today 11/02/19  Patient Profile     74 y.o. female with a PMH of HTN, palpitations, CKD Stage II, arthritis and sinus bradycardia who presented to the office with chest pain.   Assessment & Plan    1. NSTEMI:  -Pt presented with exertional CP while walking 10/31/19 with associated SOB>>>EKG showed isolate ST elevation in V2 without contiguous ST elevation in other leads and no evidence of ST depression in reciprocal leads.  -Continue Hep infusion today>>planned LHC  -Continue ASA, statin, BB -Creatinine today, 1.43>>up  from 1.2 yesterday>>will plan to continue IVF and minimize contrast dye during procedure and hydrate adequately in the post cath setting  -HsT 1434>>1170 -Denies recurrent symptoms  -Pending echo today   2. New onset Afib:  -HR more controlled today>>>80-90's  -Continue BB>>may need further titration in the post cath setting given elevated HR however limited given soft BPs>>asymptomatic  -On IV heparin as plan is for cath -Will need DOAC post cath  3. HTN:  -Stable with current therapy  4. HL:  -On simvastatin prior to admission>>transisiton to Lipitor 80mg  daily -LDL, 84 on 11/02/19   SignedKathyrn Drown NP-C [facility] Pager: [phone] 11/02/2019, 8:02 AM     For questions or updates, please contact   Please consult [email or web address] for contact info under Cardiology/STEMI.

## 2019-11-03 LAB — CBC
HCT: 37.9 % (ref 36.0–46.0)
Hemoglobin: 12.2 g/dL (ref 12.0–15.0)
MCH: 30.4 pg (ref 26.0–34.0)
MCHC: 32.2 g/dL (ref 30.0–36.0)
MCV: 94.5 fL (ref 80.0–100.0)
Platelets: 170 10*3/uL (ref 150–400)
RBC: 4.01 MIL/uL (ref 3.87–5.11)
RDW: 13.3 % (ref 11.5–15.5)
WBC: 7 10*3/uL (ref 4.0–10.5)
nRBC: 0 % (ref 0.0–0.2)

## 2019-11-03 LAB — BASIC METABOLIC PANEL
Anion gap: 10 (ref 5–15)
BUN: 14 mg/dL (ref 8–23)
CO2: 21 mmol/L — ABNORMAL LOW (ref 22–32)
Calcium: 8.4 mg/dL — ABNORMAL LOW (ref 8.9–10.3)
Chloride: 108 mmol/L (ref 98–111)
Creatinine, Ser: 1 mg/dL (ref 0.44–1.00)
GFR calc Af Amer: >60 mL/min (ref >60)
GFR calc non Af Amer: 55 mL/min — ABNORMAL LOW (ref >60)
Glucose, Bld: 96 mg/dL (ref 70–99)
Potassium: 4.2 mmol/L (ref 3.5–5.1)
Sodium: 139 mmol/L (ref 135–145)

## 2019-11-03 MED ORDER — CLOPIDOGREL BISULFATE 75 MG PO TABS: 75.0000 mg | ORAL_TABLET | Freq: Every day | ORAL | 3 refills | Status: DC

## 2019-11-03 MED ORDER — NITROGLYCERIN 0.4 MG SL SUBL: 0.4000 mg | SUBLINGUAL_TABLET | SUBLINGUAL | 3 refills | Status: DC | PRN

## 2019-11-03 MED ORDER — ASPIRIN 81 MG PO TBEC: 81.0000 mg | DELAYED_RELEASE_TABLET | Freq: Every day | ORAL | 3 refills | Status: DC

## 2019-11-03 MED ORDER — METOPROLOL SUCCINATE ER 25 MG PO TB24
25.0000 mg | ORAL_TABLET | Freq: Every day | ORAL | Status: DC
  Administered 2019-11-03: 09:00:00 25 mg via ORAL
  Filled 2019-11-03: qty 1

## 2019-11-03 MED ORDER — ASPIRIN EC 81 MG PO TBEC
81.0000 mg | DELAYED_RELEASE_TABLET | Freq: Every day | ORAL | Status: DC
  Administered 2019-11-03: 81 mg via ORAL
  Filled 2019-11-03: qty 1

## 2019-11-03 MED ORDER — ATORVASTATIN CALCIUM 80 MG PO TABS: 80.0000 mg | ORAL_TABLET | Freq: Every day | ORAL | 3 refills | Status: DC

## 2019-11-03 MED ORDER — RIVAROXABAN 20 MG PO TABS
20.0000 mg | ORAL_TABLET | Freq: Every day | ORAL | Status: DC
  Filled 2019-11-03: qty 1

## 2019-11-03 MED ORDER — METOPROLOL SUCCINATE ER 25 MG PO TB24: 25.0000 mg | ORAL_TABLET | Freq: Every day | ORAL | 3 refills | Status: DC

## 2019-11-03 NOTE — Discharge Instructions (Signed)
Heart Attack The heart is a muscle that needs oxygen to survive. A heart attack is a condition that occurs when your heart does not get enough oxygen. When this happens, the heart muscle begins to die. This can cause permanent damage if not treated right away. A heart attack is a medical emergency. This condition may be called a myocardial infarction, or MI. It is also known as acute coronary syndrome (ACS). ACS is a term used to describe a group of conditions that affect blood flow to the heart. What are the causes? This condition may be caused by:  Atherosclerosis. This occurs when a fatty substance called plaque builds up in the arteries and blocks or reduces blood supply to the heart.  A blood clot. A blood clot can develop suddenly when plaque breaks up within an artery and blocks blood flow to the heart.  Low blood pressure.  An abnormal heartbeat (arrhythmia).  Conditions that cause a decrease of oxygen to the heart, such as anemiaorrespiratory failure.  A spasm, or severe tightening, of a blood vessel that cuts off blood flow to the heart.  Tearing of a coronary artery (spontaneous coronary artery dissection).  High blood pressure. What increases the risk? The following factors may make you more likely to develop this condition:  Aging. The older you are, the higher your risk.  Having a personal or family history of chest pain, heart attack, stroke, or narrowing of the arteries in the legs, arms, head, or stomach (peripheral artery disease).  Being female.  Smoking.  Not getting regular exercise.  Being overweight or obese.  Having high blood pressure.  Having high cholesterol (hypercholesterolemia).  Having diabetes.  Drinking too much alcohol.  Using illegal drugs, such as cocaine or methamphetamine. What are the signs or symptoms? Symptoms of this condition may vary, depending on factors like gender and age. Symptoms may include:  Chest pain. It may feel  like: ? Crushing or squeezing. ? Tightness, pressure, fullness, or heaviness.  Pain in the arm, neck, jaw, back, or upper body.  Shortness of breath.  Heartburn or upset stomach.  Nausea.  Sudden cold sweats.  Feeling tired.  Sudden light-headedness. How is this diagnosed? This condition may be diagnosed through tests, such as:  Electrocardiogram (ECG) to measure the electrical activity of your heart.  Blood tests to check for cardiac markers. These chemicals are released by a damaged heart muscle.  A test to evaluate blood flow and heart function (coronary angiogram).  CT scan to see the heart more clearly.  A test to evaluate the pumping action of the heart (echocardiogram). How is this treated? A heart attack must be treated as soon as possible. Treatment may include:  Medicines to: ? Break up or dissolve blood clots (fibrinolytic therapy). ? Thin blood and help prevent blood clots. ? Treat blood pressure. ? Improve blood flow to the heart. ? Reduce pain. ? Reduce cholesterol.  Angioplasty and stent placement. These are procedures to widen a blocked artery and keep it open.  Coronary artery bypass graft, CABG, or open heart surgery. This enables blood to flow to the heart by going around the blocked part of the artery.  Oxygen therapy if needed.  Cardiac rehabilitation. This improves your health and well-being through exercise, education, and counseling. Follow these instructions at home: Medicines  Take over-the-counter and prescription medicines only as told by your health care provider.  Do not take the following medicines unless your health care provider says it is  okay to take them: ? NSAIDs, such as ibuprofen. ? Supplements that contain vitamin A, vitamin E, or both. ? Hormone replacement therapy that contains estrogen with or without progestin. Lifestyle   Do not use any products that contain nicotine or tobacco, such as cigarettes, e-cigarettes,  and chewing tobacco. If you need help quitting, ask your health care provider.  Avoid secondhand smoke.  Exercise regularly. Ask your health care provider about participating in a cardiac rehabilitation program that helps you start exercising safely after a heart attack.  Eat a heart-healthy diet. Your health care provider will tell you what foods to eat.  Maintain a healthy weight.  Learn ways to manage stress.  Do not use illegal drugs. Alcohol use  Do not drink alcohol if: ? Your health care provider tells you not to drink. ? You are pregnant, may be pregnant, or are planning to become pregnant.  If you drink alcohol: ? Limit how much you use to:  0-1 drink a day for women.  0-2 drinks a day for men. ? Be aware of how much alcohol is in your drink. In the [place] one drink equals one 12 oz bottle of beer (355 mL), one 5 oz glass of wine (148 mL), or one 1 oz glass of hard liquor (44 mL). General instructions  Work with your health care provider to manage any other conditions you have, such as high blood pressure or diabetes. These conditions affect your heart.  Get screened for depression, and seek treatment if needed.  Keep your vaccinations up to date. Get the flu vaccine every year.  Keep all follow-up visits as told by your health care provider. This is important. Contact a health care provider if:  You feel overwhelmed or sad.  You have trouble doing your daily activities. Get help right away if:  You have sudden, unexplained discomfort in your chest, arms, back, neck, jaw, or upper body.  You have shortness of breath.  You suddenly start to sweat or your skin gets clammy.  You feel nauseous or you vomit.  You have unexplained tiredness or weakness.  You suddenly feel light-headed or dizzy.  You notice your heart starts to beat fast or feels like it is skipping beats.  You have blood pressure that is higher than 180/120. These symptoms may represent a  serious problem that is an emergency. Do not wait to see if the symptoms will go away. Get medical help right away. Call your local emergency services (911 in the [place] Do not drive yourself to the hospital. Summary  A heart attack, also called myocardial infarction, is a condition that occurs when your heart does not get enough oxygen. This is caused by anything that blocks or reduces blood flow to the heart.  Treatment is a combination of medicines and surgeries, if needed, to open the blocked arteries and restore blood flow to the heart.  A heart attack is an emergency. Get help right away if you have sudden discomfort in your chest, arms, back, neck, jaw, or upper body. Seek help if you feel nauseous, you vomit, or you feel light-headed or dizzy. This information is not intended to replace advice given to you by your health care provider. Make sure you discuss any questions you have with your health care provider. Document Revised: 12/28/2018 Document Reviewed: 01/01/2019 Elsevier Patient Education  [employer].   Atrial Fibrillation  Atrial fibrillation is a type of irregular or rapid heartbeat (arrhythmia). In atrial fibrillation, the top  part of the heart (atria) beats in an irregular pattern. This makes the heart unable to pump blood normally and effectively. The goal of treatment is to prevent blood clots from forming, control your heart rate, or restore your heartbeat to a normal rhythm. If this condition is not treated, it can cause serious problems, such as a weakened heart muscle (cardiomyopathy) or a stroke. What are the causes? This condition is often caused by medical conditions that damage the heart's electrical system. These include:  High blood pressure (hypertension). This is the most common cause.  Certain heart problems or conditions, such as heart failure, coronary artery disease, heart valve problems, or heart surgery.  Diabetes.  Overactive thyroid  (hyperthyroidism).  Obesity.  Chronic kidney disease. In some cases, the cause of this condition is not known. What increases the risk? This condition is more likely to develop in:  Older people.  People who smoke.  Athletes who do endurance exercise.  People who have a family history of atrial fibrillation.  Men.  People who use drugs.  People who drink a lot of alcohol.  People who have lung conditions, such as emphysema, pneumonia, or COPD.  People who have obstructive sleep apnea. What are the signs or symptoms? Symptoms of this condition include:  A feeling that your heart is racing or beating irregularly.  Discomfort or pain in your chest.  Shortness of breath.  Sudden light-headedness or weakness.  Tiring easily during exercise or activity.  Fatigue.  Syncope (fainting).  Sweating. In some cases, there are no symptoms. How is this diagnosed? Your health care provider may detect atrial fibrillation when taking your pulse. If detected, this condition may be diagnosed with:  An electrocardiogram (ECG) to check electrical signals of the heart.  An ambulatory cardiac monitor to record your heart's activity for a few days.  A transthoracic echocardiogram (TTE) to create pictures of your heart.  A transesophageal echocardiogram (TEE) to create even closer pictures of your heart.  A stress test to check your blood supply while you exercise.  Imaging tests, such as a CT scan or chest X-ray.  Blood tests. How is this treated? Treatment depends on underlying conditions and how you feel when you experience atrial fibrillation. This condition may be treated with:  Medicines to prevent blood clots or to treat heart rate or heart rhythm problems.  Electrical cardioversion to reset the heart's rhythm.  A pacemaker to correct abnormal heart rhythm.  Ablation to remove the heart tissue that sends abnormal signals.  Left atrial appendage closure to seal the  area where blood clots can form. In some cases, underlying conditions will be treated. Follow these instructions at home: Medicines  Take over-the counter and prescription medicines only as told by your health care provider.  Do not take any new medicines without talking to your health care provider.  If you are taking blood thinners: ? Talk with your health care provider before you take any medicines that contain aspirin or NSAIDs, such as ibuprofen. These medicines increase your risk for dangerous bleeding. ? Take your medicine exactly as told, at the same time every day. ? Avoid activities that could cause injury or bruising, and follow instructions about how to prevent falls. ? Wear a medical alert bracelet or carry a card that lists what medicines you take. Lifestyle      Do not use any products that contain nicotine or tobacco, such as cigarettes, e-cigarettes, and chewing tobacco. If you need help quitting, ask  your health care provider.  Eat heart-healthy foods. Talk with a dietitian to make an eating plan that is right for you.  Exercise regularly as told by your health care provider.  Do not drink alcohol.  Lose weight if you are overweight.  Do not use drugs, including cannabis. General instructions  If you have obstructive sleep apnea, manage your condition as told by your health care provider.  Do not use diet pills unless your health care provider approves. Diet pills can make heart problems worse.  Keep all follow-up visits as told by your health care provider. This is important. Contact a health care provider if you:  Notice a change in the rate, rhythm, or strength of your heartbeat.  Are taking a blood thinner and you notice more bruising.  Tire more easily when you exercise or do heavy work.  Have a sudden change in weight. Get help right away if you have:   Chest pain, abdominal pain, sweating, or weakness.  Trouble breathing.  Side effects of  blood thinners, such as blood in your vomit, stool, or urine, or bleeding that cannot stop.  Any symptoms of a stroke. "BE FAST" is an easy way to remember the main warning signs of a stroke: ? B - Balance. Signs are dizziness, sudden trouble walking, or loss of balance. ? E - Eyes. Signs are trouble seeing or a sudden change in vision. ? F - Face. Signs are sudden weakness or numbness of the face, or the face or eyelid drooping on one side. ? A - Arms. Signs are weakness or numbness in an arm. This happens suddenly and usually on one side of the body. ? S - Speech. Signs are sudden trouble speaking, slurred speech, or trouble understanding what people say. ? T - Time. Time to call emergency services. Write down what time symptoms started.  Other signs of a stroke, such as: ? A sudden, severe headache with no known cause. ? Nausea or vomiting. ? Seizure. These symptoms may represent a serious problem that is an emergency. Do not wait to see if the symptoms will go away. Get medical help right away. Call your local emergency services (911 in the [place] Do not drive yourself to the hospital. Summary  Atrial fibrillation is a type of irregular or rapid heartbeat (arrhythmia).  Symptoms include a feeling that your heart is beating fast or irregularly.  You may be given medicines to prevent blood clots or to treat heart rate or heart rhythm problems.  Get help right away if you have signs or symptoms of a stroke.  Get help right away if you cannot catch your breath or have chest pain or pressure. This information is not intended to replace advice given to you by your health care provider. Make sure you discuss any questions you have with your health care provider. Document Revised: 03/14/2019 Document Reviewed: 03/14/2019 Elsevier Patient Education  [place] on my medicine - XARELTO (Rivaroxaban)  Why was Xarelto prescribed for you? Xarelto was prescribed for you  to reduce the risk of a blood clot forming that can cause a stroke if you have a medical condition called atrial fibrillation (a type of irregular heartbeat).  What do you need to know about xarelto ? Take your Xarelto ONCE DAILY at the same time every day with your evening meal. If you have difficulty swallowing the tablet whole, you may crush it and mix in applesauce just prior to taking your dose.  Take  Xarelto exactly as prescribed by your doctor and DO NOT stop taking Xarelto without talking to the doctor who prescribed the medication.  Stopping without other stroke prevention medication to take the place of Xarelto may increase your risk of developing a clot that causes a stroke.  Refill your prescription before you run out.  After discharge, you should have regular check-up appointments with your healthcare provider that is prescribing your Xarelto.  In the future your dose may need to be changed if your kidney function or weight changes by a significant amount.  What do you do if you miss a dose? If you are taking Xarelto ONCE DAILY and you miss a dose, take it as soon as you remember on the same day then continue your regularly scheduled once daily regimen the next day. Do not take two doses of Xarelto at the same time or on the same day.   Important Safety Information A possible side effect of Xarelto is bleeding. You should call your healthcare provider right away if you experience any of the following: ? Bleeding from an injury or your nose that does not stop. ? Unusual colored urine (red or dark brown) or unusual colored stools (red or black). ? Unusual bruising for unknown reasons. ? A serious fall or if you hit your head (even if there is no bleeding).  Some medicines may interact with Xarelto and might increase your risk of bleeding while on Xarelto. To help avoid this, consult your healthcare provider or pharmacist prior to using any new prescription or  non-prescription medications, including herbals, vitamins, non-steroidal anti-inflammatory drugs (NSAIDs) and supplements.  This website has more information on Xarelto: [email or web address]

## 2019-11-03 NOTE — Progress Notes (Signed)
Progress Note  Patient Name: Melissa Clements Date of Encounter: 11/03/2019  Primary Cardiologist: Jenne Campus, MD   Subjective   No chest pain or SOB. Issues with right arm hematoma overnight.   Inpatient Medications    Scheduled Meds: . atorvastatin  80 mg Oral q1800  . clopidogrel  75 mg Oral Q breakfast  . metoprolol tartrate  25 mg Oral BID  . rivaroxaban  15 mg Oral Daily  . sodium chloride flush  3 mL Intravenous Q12H  . sodium chloride flush  3 mL Intravenous Q12H   Continuous Infusions: . sodium chloride 10 mL/hr at 11/01/19 1104  . sodium chloride     PRN Meds: sodium chloride, acetaminophen, morphine injection, nitroGLYCERIN, ondansetron (ZOFRAN) IV, sodium chloride flush   Vital Signs    Vitals:   11/02/19 2023 11/02/19 2112 11/03/19 0400 11/03/19 0807  BP: 119/79 104/60 115/84 105/76  Pulse: 68 84 86   Resp: 18  19   Temp: 98.4 F (36.9 C)  98.2 F (36.8 C) 98.5 F (36.9 C)  TempSrc: Oral  Oral Oral  SpO2: 100%  99%   Weight:   111 kg   Height:        Intake/Output Summary (Last 24 hours) at 11/03/2019 0813 Last data filed at 11/03/2019 0355 Gross per 24 hour  Intake 600 ml  Output 950 ml  Net -350 ml   Last 3 Weights 11/03/2019 11/02/2019 11/01/2019  Weight (lbs) 244 lb 11.4 oz 241 lb 6.5 oz 245 lb 9.5 oz  Weight (kg) 111 kg 109.5 kg 111.4 kg      Telemetry    Rate controlled afib - Personally Reviewed  ECG    n/a - Personally Reviewed  Physical Exam   GEN: No acute distress.   Neck: No JVD Cardiac: irreg, no murmurs, rubs, or gallops.  Respiratory: Clear to auscultation bilaterally. GI: Soft, nontender, non-distended  MS: No edema; No deformity. Right arm moderate hematoma.  Neuro:  Nonfocal  Psych: Normal affect   Labs    High Sensitivity Troponin:   Recent Labs  Lab 11/01/19 1030 11/01/19 1230 11/01/19 2002 11/01/19 2144  TROPONINIHS 1,312* 1,950* 1,434* 1,170*      Chemistry Recent Labs  Lab 11/01/19 1030  11/02/19 0344 11/03/19 0541  NA 139 139 139  K 3.8 4.0 4.2  CL 109 105 108  CO2 21* 24 21*  GLUCOSE 109* 97 96  BUN 23 22 14   CREATININE 1.25* 1.43* 1.00  CALCIUM 9.2 8.7* 8.4*  GFRNONAA 42* 36* 55*  GFRAA 49* 42* >60  ANIONGAP 9 10 10      Hematology Recent Labs  Lab 11/01/19 1030 11/02/19 0344 11/03/19 0541  WBC 5.8 6.0 7.0  RBC 4.72 4.51 4.01  HGB 14.5 13.7 12.2  HCT 44.3 42.2 37.9  MCV 93.9 93.6 94.5  MCH 30.7 30.4 30.4  MCHC 32.7 32.5 32.2  RDW 13.2 13.3 13.3  PLT 204 194 170    BNP Recent Labs  Lab 11/01/19 1029  BNP 410.5*     DDimer No results for input(s): DDIMER in the last 168 hours.   Radiology    CARDIAC CATHETERIZATION  Result Date: 11/02/2019  Prox LAD to Mid LAD lesion is 90% stenosed eccentric, focal calcification).  A drug-eluting stent was successfully placed using a SYNERGY XD 3.0X20. Postdilated to 3.6 mm  Post intervention, there is a 10% focal residual stenosis-waist, with the remainder being fully expanded.Marland Kitchen  ------------------------  LV end diastolic pressure is normal.  There is no aortic valve stenosis.  SUMMARY  90% proximal LAD Apple Core (mildly calcified) lesion as culprit with successful DES PCI using Synergy DES 3.0 mm x 20 mm postdilated to 3.6 mm (focal area would not fully expand to the full diameter, but otherwise well expanded)  Otherwise normal coronaries with a Left Dominant LCx and moderate size Ramus Intermedius -with no notable disease other than focal lesion.  Relative normal LVEDP of 8-11 mmHg RECOMMENDATIONS  Patient will be transferred to 6 E. for ongoing care.   Restart IV heparin 8 hours after sheath removal with plan to start Xarelto in the morning and stop aspirin  Appears to be relatively euvolemic with LVEDP of 11, will gently hydrate  Needs aggressive cardiomyopathy and A. fib management per primary team Glenetta Hew, M.D., M.S. Interventional Cardiologist   DG Chest Port 1 View  Result Date:  11/01/2019 CLINICAL DATA:  Shortness of breath.  Diaphoresis. EXAM: PORTABLE CHEST 1 VIEW COMPARISON:  None. FINDINGS: Cardiac silhouette normal in size. No mediastinal or hilar masses. No evidence of adenopathy. Clear lungs.  No pleural effusion or pneumothorax. Skeletal structures are demineralized but grossly intact. IMPRESSION: No active disease. Electronically Signed   By: Lajean Manes M.D.   On: 11/01/2019 10:52   ECHOCARDIOGRAM COMPLETE  Result Date: 11/02/2019   ECHOCARDIOGRAM REPORT   Patient Name:   Melissa Clements Date of Exam: 11/02/2019 Medical Rec #:  [id number]       Height:       65.0 in Accession #:    [id number]      Weight:       241.4 lb Date of Birth:  14-Aug-1946        BSA:          2.14 m Patient Age:    74 years        BP:           107/60 mmHg Patient Gender: F               HR:           68 bpm. Exam Location:  Inpatient Procedure: 2D Echo and Intracardiac Opacification Agent Indications:    Chest Pain R07.9  History:        Patient has no prior history of Echocardiogram examinations.                 Arrythmias:Atrial Fibrillation; Risk Factors:Hypertension and                 Dyslipidemia.  Sonographer:    Mikki Santee RDCS (AE) Referring Phys: [place]  1. Left ventricular ejection fraction, by visual estimation, is 45 to 50%. The left ventricle has mildly decreased function. There is no left ventricular hypertrophy.  2. Mild dyskinesis of the left ventricular, mid-apical anteroseptal wall and apical segment. This suggests infarction in the mid-LAD artery distribution. Other walls show compensatory hyperdysnamic function.  3. Definity contrast agent was given IV to delineate the left ventricular endocardial borders. No intracavitary thrombus is seen.  4. Left ventricular diastolic function could not be evaluated.  5. Global right ventricle has mildly reduced systolic function.The right ventricular size is normal. No increase in right ventricular wall  thickness.  6. Left atrial size was normal.  7. Right atrial size was normal.  8. Mild mitral annular calcification.  9. The mitral valve is normal in structure. Trivial mitral valve regurgitation. 10. The tricuspid valve is grossly normal. 11.  The tricuspid valve is grossly normal. Tricuspid valve regurgitation is trivial. 12. The aortic valve is normal in structure. Aortic valve regurgitation is not visualized. 13. The pulmonic valve was not well visualized. Pulmonic valve regurgitation is not visualized. 14. The aortic root was not well visualized. 15. Normal pulmonary artery systolic pressure. 16. The interatrial septum was not well visualized. 17. The left ventricle demonstrates regional wall motion abnormalities. FINDINGS  Left Ventricle: Left ventricular ejection fraction, by visual estimation, is 45 to 50%. The left ventricle has mildly decreased function. Mild dyskinesis of the left ventricular, mid-apical anteroseptal wall and apical segment. Definity contrast agent was given IV to delineate the left ventricular endocardial borders. The left ventricle demonstrates regional wall motion abnormalities. There is no left ventricular hypertrophy. The left ventricular diastology could not be evaluated due to atrial fibrillation. Left ventricular diastolic function could not be evaluated. Right Ventricle: The right ventricular size is normal. No increase in right ventricular wall thickness. Global RV systolic function is has mildly reduced systolic function. The tricuspid regurgitant velocity is 2.15 m/s, and with an assumed right atrial pressure of 3 mmHg, the estimated right ventricular systolic pressure is normal at 21.6 mmHg. Left Atrium: Left atrial size was normal in size. Right Atrium: Right atrial size was normal in size Pericardium: There is no evidence of pericardial effusion. Mitral Valve: The mitral valve is normal in structure. Mild mitral annular calcification. Trivial mitral valve regurgitation.  Tricuspid Valve: The tricuspid valve is grossly normal. Tricuspid valve regurgitation is trivial. Aortic Valve: The aortic valve is normal in structure. Aortic valve regurgitation is not visualized. Pulmonic Valve: The pulmonic valve was not well visualized. Pulmonic valve regurgitation is not visualized. Pulmonic regurgitation is not visualized. Aorta: The aortic root was not well visualized. IAS/Shunts: The interatrial septum was not well visualized.  LEFT VENTRICLE PLAX 2D LVIDd:         3.40 cm LVIDs:         2.10 cm LV PW:         0.90 cm LV IVS:        0.90 cm LVOT diam:     1.70 cm LV SV:         33 ml LV SV Index:   14.39 LVOT Area:     2.27 cm  RIGHT VENTRICLE RV S prime:     9.46 cm/s TAPSE (M-mode): 1.1 cm LEFT ATRIUM             Index       RIGHT ATRIUM           Index LA diam:        3.20 cm 1.49 cm/m  RA Area:     11.60 cm LA Vol (A2C):   49.4 ml 23.05 ml/m RA Volume:   21.90 ml  10.22 ml/m LA Vol (A4C):   55.4 ml 25.85 ml/m LA Biplane Vol: 54.6 ml 25.48 ml/m   AORTA Ao Root diam: 2.80 cm TRICUSPID VALVE TR Peak grad:   18.6 mmHg TR Vmax:        232.00 cm/s  SHUNTS Systemic Diam: 1.70 cm  Dani Gobble Croitoru MD Electronically signed by Sanda Klein MD Signature Date/Time: 11/02/2019/4:16:44 PM    Final     Cardiac Studies    Patient Profile     74 y.o. female with a PMH of HTN, palpitations, CKD Stage II, arthritis and sinus bradycardia who presented to the office with chest pain.  Assessment & Plan  1. NSTEMI - hstrop up to 1950 - echo LVEF 45-50%. Dyskinesis mid apical anteroseptal walls, apex.  - cath showed LM patent, LAD prox to mid 90%, ramus patent, LCX patent, RCA small and patent. Received DES to LAD.   - medical therapy with atorva 80, plavix 75, lopressor 25mg  bid. No ACE/ARB due to renal dysfunction - with low normal to mildly decreased LVEF change lopressor to toprol 25mg  daily.  -per interventional cards recs for plavix and xarelto, no aspirin. We are holding  xarelto now due to right arm hematoma, stay on asa/plavix at this time.   2. CKD - baseline Cr 1.4-1.6 based on 01/2018 labs - Cr elevated on admit, down to 1 with IVFs - with labile renal function avoid ACE/ARB  3. New diagnosis of Afib - rate control with toprol 25mg  daily - hold anticoagulation given right arm hematoma at cath site. Was started on heparin last night 8 hours after procedure for her afib, d/c heparin.    4. Right arm hematoma - above right radial site. Stable from overnight based on notes - will hold on starting xarelto for now - Hgb drop likely from procedure and hydration, hematoma appears stable.   We will check back later this AM on her hematoma and how she does with ambulation. Possible discharge later today.   For questions or updates, please contact [place] Please consult [email or web address] for contact info under        Signed, Carlyle Dolly, MD  11/03/2019, 8:13 AM

## 2019-11-03 NOTE — Progress Notes (Signed)
CARDIAC REHAB PHASE I   PRE:  Rate/Rhythm: 74 Afib    BP: sitting 117/68    SaO2: 98 RA  MODE:  Ambulation: 400 ft   POST:  Rate/Rhythm: 135 max with ambulation, 92 recovery Afib    BP: sitting 138/79     SaO2: 98 RA  [id number] Patient ambulated in hallway independently. Steady gait noted. 1 standing rest break secondary to shortness of breath. Patient denies all other complaints and feels her shortness of breath was related to the thickness of her mask. Post ambulation patient to recliner with phone and call bell in place. Cardiac Rehab discharge education completed including review of MI booklet, exercise, restrictions, antiplatelet therapy, Afib, nutrition, and phase 2 cardiac rehab. Patient is interested in in person CR at [facility].  With patient permission, referral placed.  Pt is interested in participating in Virtual Cardiac and Pulmonary Rehab. Pt advised that Virtual Cardiac and Pulmonary Rehab is provided at no cost to the patient.  Checklist:  1. Pt has smart device  ie smartphone and/or ipad for downloading an app  Yes 2. Reliable internet/wifi service    Yes 3. Understands how to use their smartphone and navigate within an app.  Yes with help of husband   Pt verbalized understanding and is in agreement.  Dracen Reigle Minus Breeding RN, BSN

## 2019-11-03 NOTE — Discharge Summary (Signed)
Discharge Summary    Patient ID: [name] MRN: [id number]; DOB: March 21, 1946  Admit date: 11/01/2019 Discharge date: 11/03/2019  Primary Care Provider: [name], MD  Primary Cardiologist: Jenne Campus, MD  Primary Electrophysiologist:  None   Discharge Diagnoses    Principal Problem:   NSTEMI (non-ST elevated myocardial infarction) [facility]) Active Problems:   Precordial chest pain   Palpitations   Obese   New onset a-fib [facility])    Diagnostic Studies/Procedures    Left heart cath 11/02/19:  Prox LAD to Mid LAD lesion is 90% stenosed eccentric, focal calcification).  A drug-eluting stent was successfully placed using a SYNERGY XD 3.0X20. Postdilated to 3.6 mm  Post intervention, there is a 10% focal residual stenosis-waist, with the remainder being fully expanded.Marland Kitchen  ------------------------  LV end diastolic pressure is normal.  There is no aortic valve stenosis.   SUMMARY  90% proximal LAD Apple Core (mildly calcified) lesion as culprit with successful DES PCI using Synergy DES 3.0 mm x 20 mm postdilated to 3.6 mm (focal area would not fully expand to the full diameter, but otherwise well expanded)  Otherwise normal coronaries with a Left Dominant LCx and moderate size Ramus Intermedius-with no notable disease other than focal lesion.  Relative normal LVEDP of 8-11 mmHg  RECOMMENDATIONS  Patient will be transferred to 6 E. for ongoing care.   Restart IV heparin 8 hours after sheath removal with plan to start Xarelto in the morning and stop aspirin  Appears to be relatively euvolemic with LVEDP of 11, will gently hydrate  Needs aggressive cardiomyopathy and A. fib management per primary team _____________   Echo 11/02/19: 1. Left ventricular ejection fraction, by visual estimation, is 45 to  50%. The left ventricle has mildly decreased function. There is no left  ventricular hypertrophy.  2. Mild dyskinesis of the left ventricular, mid-apical  anteroseptal wall  and apical segment. This suggests infarction in the mid-LAD artery  distribution. Other walls show compensatory hyperdysnamic function.  3. Definity contrast agent was given IV to delineate the left ventricular  endocardial borders. No intracavitary thrombus is seen.  4. Left ventricular diastolic function could not be evaluated.  5. Global right ventricle has mildly reduced systolic function.The right  ventricular size is normal. No increase in right ventricular wall  thickness.  6. Left atrial size was normal.  7. Right atrial size was normal.  8. Mild mitral annular calcification.  9. The mitral valve is normal in structure. Trivial mitral valve  regurgitation.  10. The tricuspid valve is grossly normal.  11. The tricuspid valve is grossly normal. Tricuspid valve regurgitation  is trivial.  12. The aortic valve is normal in structure. Aortic valve regurgitation is  not visualized.  13. The pulmonic valve was not well visualized. Pulmonic valve  regurgitation is not visualized.  14. The aortic root was not well visualized.  15. Normal pulmonary artery systolic pressure.  16. The interatrial septum was not well visualized.  17. The left ventricle demonstrates regional wall motion abnormalities.   History of Present Illness     Melissa Clements is a 74 y.o. female with PMH of HTN, palpitations, CKD, arthritis and sinus bradycardia who presented to the office with chest pain.   Melissa Clements is a 74 yo female with PMH noted above. She is a retired [employer] scientist. Followed by Dr. Agustin Cree as an outpatient. She presented to the office today with complaints of chest pain and palpitations. States these episodes actually started yesterday and  called into the office. Was advised to go to the ED but refused. Yesterday she was walking and developed shortness of breath and chest tightness. Rated pain 3-4/10. Also had palpitations. Reports she took 10 ASA to help with her chest  pain. In the office her EKG showed new onset afib with RVR. With her symptoms she was sent downstairs to the [facility] ED for admission.   In the ED her labs showed stable electrolytes, Cr 1.25, BNP 410, hsTn 1312>>1950, Hgb 14.5, TSH 3.181, Hgb A1c 5.5. CXR neg. She was started on IV heparin at [facility] and transferred to [facility] for further management and plans for cardiac cath.   Hospital Course     Consultants: none  NSTEMI CAD s/p DES to pLAD HS troponin 1312 --> 1950 --> 1434 --> 1170 EKG with ST elevation in V2. She was admitted to cardiology for angiography. LHC the next day with 90% proximal LAD lesion successfully treated with DES. She tolerated the procedure well. Unfortunately, she developed hemoatoma at her cath site overnight. Heparin gtt started overnight for Afib was held. Initially planned for xarelto for Afib, now held. On ASA and plavix for DES. Continue 80 mg lipitor, toprol 25 mg daily. No ACEI/ARB for renal function. Home amlodipine was held at discharge.   New onset atrial fibrillation Was started on xarelto, but this was held at discharge due to arm hematoma at cath site. Will make early follow up in [place]. Atrial sizes normal. Rate controlled on lopressor succinate 25 mg daily.   Mild cardiomyopathy with EF 45-50% In the setting of NSTEMI and new onset Afib. Echo with EF 45-50% with mild dyskinesis of the LV, mid-apical anteroseptal wall and apical segment, which suggested infarction of the mid-LAD artery distribution. No ACEI/ARB for renal function. Soft pressures, unable to add hydralazine/nitrates. Would repeat echo in 3 months.    CKD - baseline sCr 1.4-1.6 - sCr 1.0 today with IVF - will avoid ACEI/ARB   Right arm hematoma  Will hold xarelto for now. Continue ASA and plavix. Hematoma re-examined by Dr. Harl Bowie and felt to be stable. Instructed to continue holding xarelto for now. Appt made at [facility] with Kerin Ransom PAC on Tues, since no availability in [facility] (I'm unable to schedule there). Per Dr. Harl Bowie, if wrist hematoma is stable or improving, stop ASA and restart xarelto. Continue plavix.      Did the patient have an acute coronary syndrome (MI, NSTEMI, STEMI, etc) this admission?:  Yes                               AHA/ACC Clinical Performance & Quality Measures: 1. Aspirin prescribed? - Yes 2. ADP Receptor Inhibitor (Plavix/Clopidogrel, Brilinta/Ticagrelor or Effient/Prasugrel) prescribed (includes medically managed patients)? - Yes 3. Beta Blocker prescribed? - Yes 4. High Intensity Statin (Lipitor 40-80mg  or Crestor 20-40mg ) prescribed? - Yes 5. EF assessed during THIS hospitalization? - Yes 6. For EF <40%, was ACEI/ARB prescribed? - Not Applicable (EF >/= [id number]) 7. For EF <40%, Aldosterone Antagonist (Spironolactone or Eplerenone) prescribed? - Not Applicable (EF >/= [id number]) 8. Cardiac Rehab Phase II ordered (Included Medically managed Patients)? - Yes   _____________  Discharge Vitals Blood pressure (!) 116/54, pulse 83, temperature 98.4 F (36.9 C), temperature source Oral, resp. rate 19, height 5\' 5"  (1.651 m), weight 111 kg, SpO2 99 %.  Filed Weights   11/01/19 1631 11/02/19 0403 11/03/19 0400  Weight: 111.4 kg 109.5  kg 111 kg    Labs & Radiologic Studies    CBC Recent Labs    11/02/19 0344 11/03/19 0541  WBC 6.0 7.0  HGB 13.7 12.2  HCT 42.2 37.9  MCV 93.6 94.5  PLT 194 [id number]   Basic Metabolic Panel Recent Labs    11/01/19 1030 11/01/19 1030 11/02/19 0344 11/03/19 0541  NA 139   < > 139 139  K 3.8   < > 4.0 4.2  CL 109   < > 105 108  CO2 21*   < > 24 21*  GLUCOSE 109*   < > 97 96  BUN 23   < > 22 14  CREATININE 1.25*   < > 1.43* 1.00  CALCIUM 9.2   < > 8.7* 8.4*  MG 2.1  --   --   --    < > = values in this interval not displayed.   Liver Function Tests No results for input(s): AST, ALT, ALKPHOS, BILITOT, PROT, ALBUMIN in the last 72 hours. No results for input(s): LIPASE, AMYLASE in the last 72  hours. High Sensitivity Troponin:   Recent Labs  Lab 11/01/19 1030 11/01/19 1230 11/01/19 2002 11/01/19 2144  TROPONINIHS 1,312* 1,950* 1,434* 1,170*    BNP Invalid input(s): POCBNP D-Dimer No results for input(s): DDIMER in the last 72 hours. Hemoglobin A1C Recent Labs    11/01/19 1036  HGBA1C 5.5   Fasting Lipid Panel Recent Labs    11/02/19 0344  CHOL 153  HDL 58  LDLCALC 84  TRIG 55  CHOLHDL 2.6   Thyroid Function Tests Recent Labs    11/01/19 1038  TSH 3.181   _____________  CARDIAC CATHETERIZATION  Result Date: 11/02/2019  Prox LAD to Mid LAD lesion is 90% stenosed eccentric, focal calcification).  A drug-eluting stent was successfully placed using a SYNERGY XD 3.0X20. Postdilated to 3.6 mm  Post intervention, there is a 10% focal residual stenosis-waist, with the remainder being fully expanded.Marland Kitchen  ------------------------  LV end diastolic pressure is normal.  There is no aortic valve stenosis.  SUMMARY  90% proximal LAD Apple Core (mildly calcified) lesion as culprit with successful DES PCI using Synergy DES 3.0 mm x 20 mm postdilated to 3.6 mm (focal area would not fully expand to the full diameter, but otherwise well expanded)  Otherwise normal coronaries with a Left Dominant LCx and moderate size Ramus Intermedius -with no notable disease other than focal lesion.  Relative normal LVEDP of 8-11 mmHg RECOMMENDATIONS  Patient will be transferred to 6 E. for ongoing care.   Restart IV heparin 8 hours after sheath removal with plan to start Xarelto in the morning and stop aspirin  Appears to be relatively euvolemic with LVEDP of 11, will gently hydrate  Needs aggressive cardiomyopathy and A. fib management per primary team Glenetta Hew, M.D., M.S. Interventional Cardiologist   DG Chest Port 1 View  Result Date: 11/01/2019 CLINICAL DATA:  Shortness of breath.  Diaphoresis. EXAM: PORTABLE CHEST 1 VIEW COMPARISON:  None. FINDINGS: Cardiac silhouette normal  in size. No mediastinal or hilar masses. No evidence of adenopathy. Clear lungs.  No pleural effusion or pneumothorax. Skeletal structures are demineralized but grossly intact. IMPRESSION: No active disease. Electronically Signed   By: Lajean Manes M.D.   On: 11/01/2019 10:52   ECHOCARDIOGRAM COMPLETE  Result Date: 11/02/2019   ECHOCARDIOGRAM REPORT   Patient Name:   Melissa Clements Date of Exam: 11/02/2019 Medical Rec #:  [id number]  Height:       65.0 in Accession #:    [id number]      Weight:       241.4 lb Date of Birth:  April 15, 1946        BSA:          2.14 m Patient Age:    42 years        BP:           107/60 mmHg Patient Gender: F               HR:           68 bpm. Exam Location:  Inpatient Procedure: 2D Echo and Intracardiac Opacification Agent Indications:    Chest Pain R07.9  History:        Patient has no prior history of Echocardiogram examinations.                 Arrythmias:Atrial Fibrillation; Risk Factors:Hypertension and                 Dyslipidemia.  Sonographer:    Mikki Santee RDCS (AE) Referring Phys: [place]  1. Left ventricular ejection fraction, by visual estimation, is 45 to 50%. The left ventricle has mildly decreased function. There is no left ventricular hypertrophy.  2. Mild dyskinesis of the left ventricular, mid-apical anteroseptal wall and apical segment. This suggests infarction in the mid-LAD artery distribution. Other walls show compensatory hyperdysnamic function.  3. Definity contrast agent was given IV to delineate the left ventricular endocardial borders. No intracavitary thrombus is seen.  4. Left ventricular diastolic function could not be evaluated.  5. Global right ventricle has mildly reduced systolic function.The right ventricular size is normal. No increase in right ventricular wall thickness.  6. Left atrial size was normal.  7. Right atrial size was normal.  8. Mild mitral annular calcification.  9. The mitral valve is normal  in structure. Trivial mitral valve regurgitation. 10. The tricuspid valve is grossly normal. 11. The tricuspid valve is grossly normal. Tricuspid valve regurgitation is trivial. 12. The aortic valve is normal in structure. Aortic valve regurgitation is not visualized. 13. The pulmonic valve was not well visualized. Pulmonic valve regurgitation is not visualized. 14. The aortic root was not well visualized. 15. Normal pulmonary artery systolic pressure. 16. The interatrial septum was not well visualized. 17. The left ventricle demonstrates regional wall motion abnormalities. FINDINGS  Left Ventricle: Left ventricular ejection fraction, by visual estimation, is 45 to 50%. The left ventricle has mildly decreased function. Mild dyskinesis of the left ventricular, mid-apical anteroseptal wall and apical segment. Definity contrast agent was given IV to delineate the left ventricular endocardial borders. The left ventricle demonstrates regional wall motion abnormalities. There is no left ventricular hypertrophy. The left ventricular diastology could not be evaluated due to atrial fibrillation. Left ventricular diastolic function could not be evaluated. Right Ventricle: The right ventricular size is normal. No increase in right ventricular wall thickness. Global RV systolic function is has mildly reduced systolic function. The tricuspid regurgitant velocity is 2.15 m/s, and with an assumed right atrial pressure of 3 mmHg, the estimated right ventricular systolic pressure is normal at 21.6 mmHg. Left Atrium: Left atrial size was normal in size. Right Atrium: Right atrial size was normal in size Pericardium: There is no evidence of pericardial effusion. Mitral Valve: The mitral valve is normal in structure. Mild mitral annular calcification. Trivial mitral valve regurgitation. Tricuspid Valve: The tricuspid valve is  grossly normal. Tricuspid valve regurgitation is trivial. Aortic Valve: The aortic valve is normal in  structure. Aortic valve regurgitation is not visualized. Pulmonic Valve: The pulmonic valve was not well visualized. Pulmonic valve regurgitation is not visualized. Pulmonic regurgitation is not visualized. Aorta: The aortic root was not well visualized. IAS/Shunts: The interatrial septum was not well visualized.  LEFT VENTRICLE PLAX 2D LVIDd:         3.40 cm LVIDs:         2.10 cm LV PW:         0.90 cm LV IVS:        0.90 cm LVOT diam:     1.70 cm LV SV:         33 ml LV SV Index:   14.39 LVOT Area:     2.27 cm  RIGHT VENTRICLE RV S prime:     9.46 cm/s TAPSE (M-mode): 1.1 cm LEFT ATRIUM             Index       RIGHT ATRIUM           Index LA diam:        3.20 cm 1.49 cm/m  RA Area:     11.60 cm LA Vol (A2C):   49.4 ml 23.05 ml/m RA Volume:   21.90 ml  10.22 ml/m LA Vol (A4C):   55.4 ml 25.85 ml/m LA Biplane Vol: 54.6 ml 25.48 ml/m   AORTA Ao Root diam: 2.80 cm TRICUSPID VALVE TR Peak grad:   18.6 mmHg TR Vmax:        232.00 cm/s  SHUNTS Systemic Diam: 1.70 cm  Dani Gobble Croitoru MD Electronically signed by Sanda Klein MD Signature Date/Time: 11/02/2019/4:16:44 PM    Final    Disposition   Pt is being discharged home today in good condition.  Follow-up Plans & Appointments     Discharge Instructions    Amb Referral to Cardiac Rehabilitation   Complete by: As directed    Diagnosis:  Coronary Stents NSTEMI     After initial evaluation and assessments completed: Virtual Based Care may be provided alone or in conjunction with Phase 2 Cardiac Rehab based on patient barriers.: Yes   Diet - low sodium heart healthy   Complete by: As directed    Discharge instructions   Complete by: As directed    No driving for 2 days. No lifting over 5 lbs for 1 week. No sexual activity for 1 week. Keep procedure site clean & dry. If you notice increased pain, swelling, bleeding or pus, call/return!  You may shower, but no soaking baths/hot tubs/pools for 1 week.   Discharge instructions   Complete by: As  directed    No driving for 2 days. No lifting over 5 lbs for 1 week. No sexual activity for 1 week. Keep procedure site clean & dry. If you notice increased pain, swelling, bleeding or pus, call/return!  You may shower, but no soaking baths/hot tubs/pools for 1 week.   Increase activity slowly   Complete by: As directed    Increase activity slowly   Complete by: As directed       Discharge Medications   Allergies as of 11/03/2019   No Known Allergies     Medication List    STOP taking these medications   amLODipine 2.5 MG tablet Commonly known as: NORVASC   simvastatin 40 MG tablet Commonly known as: ZOCOR     TAKE these medications   aspirin  81 MG EC tablet Take 1 tablet (81 mg total) by mouth daily. Start taking on: November 04, 2019   atorvastatin 80 MG tablet Commonly known as: LIPITOR Take 1 tablet (80 mg total) by mouth daily at 6 PM.   clopidogrel 75 MG tablet Commonly known as: PLAVIX Take 1 tablet (75 mg total) by mouth daily with breakfast. Start taking on: November 04, 2019   [employer] TRIPLE ACTION PO Take 1 capsule by mouth daily.   docusate sodium 100 MG capsule Commonly known as: Colace Take 1 capsule (100 mg total) by mouth 2 (two) times daily. What changed: when to take this   metoprolol succinate 25 MG 24 hr tablet Commonly known as: TOPROL-XL Take 1 tablet (25 mg total) by mouth daily. Start taking on: November 04, 2019 What changed:   medication strength  how much to take   nitroGLYCERIN 0.4 MG SL tablet Commonly known as: NITROSTAT Place 1 tablet (0.4 mg total) under the tongue every 5 (five) minutes x 3 doses as needed for chest pain.   Vitamin D 50 MCG (2000 UT) tablet Take 2,000 Units by mouth daily.          Outstanding Labs/Studies     Duration of Discharge Encounter   Greater than 30 minutes including physician time.  Signed, Tami Lin Tarisa Paola, PA 11/03/2019, 1:15 PM

## 2019-11-03 NOTE — Progress Notes (Signed)
ANTICOAGULATION CONSULT NOTE - Follow Up Consult  Pharmacy Consult for Eliquis Indication: atrial fibrillation  No Known Allergies  Patient Measurements: Height: 5\' 5"  (165.1 cm) Weight: 244 lb 11.4 oz (111 kg) IBW/kg (Calculated) : 57  Vital Signs: Temp: 98.5 F (36.9 C) (01/30 0807) Temp Source: Oral (01/30 0807) BP: 105/76 (01/30 0807) Pulse Rate: 86 (01/30 0400)  Labs: Recent Labs    11/01/19 1030 11/01/19 1030 11/01/19 1230 11/01/19 1854 11/01/19 2002 11/01/19 2144 11/02/19 0344 11/03/19 0541  HGB 14.5   < >  --   --   --   --  13.7 12.2  HCT 44.3  --   --   --   --   --  42.2 37.9  PLT 204  --   --   --   --   --  194 170  APTT 28  --   --   --   --   --   --   --   LABPROT 13.7  --   --   --   --   --   --   --   INR 1.1  --   --   --   --   --   --   --   HEPARINUNFRC  --   --   --  0.79*  --   --  0.66  --   CREATININE 1.25*  --   --   --   --   --  1.43* 1.00  TROPONINIHS 1,312*   < > 1,950*  --  1,434* 1,170*  --   --    < > = values in this interval not displayed.    Estimated Creatinine Clearance: 61.2 mL/min (by C-G formula based on SCr of 1 mg/dL).  Assessment:  Anticoag: Heparin for ACS/ new onset Afib - no AC PTA Trop 1312>1950>1170. CrCl >50 using TBW as Scr down to 1 - 1/29: Radstat band post-cath for hematoma. Hgb this AM 13.7>>12.2 down.  Goal of Therapy:  Therapeutic oral anticoagulation   Plan:  Increase Xarelto 20mg  daily   Anthonny Schiller S. Alford Highland, PharmD, BCPS Clinical Staff Pharmacist [email or web address]  Alford Highland, Acey Woodfield Stillinger 11/03/2019,8:12 AM

## 2019-11-05 LAB — POCT ACTIVATED CLOTTING TIME
Activated Clotting Time: 329 seconds
Activated Clotting Time: 373 seconds

## 2019-11-06 ENCOUNTER — Encounter (HOSPITAL_COMMUNITY): Payer: Self-pay | Admitting: *Deleted

## 2019-11-06 ENCOUNTER — Encounter: Payer: Self-pay | Admitting: Cardiology

## 2019-11-06 ENCOUNTER — Ambulatory Visit: Payer: Medicare Other | Admitting: Cardiology

## 2019-11-06 ENCOUNTER — Other Ambulatory Visit: Payer: Self-pay

## 2019-11-06 VITALS — BP 140/78 | HR 103 | Temp 97.1°F | Ht 65.0 in | Wt 246.1 lb

## 2019-11-06 DIAGNOSIS — I97630 Postprocedural hematoma of a circulatory system organ or structure following a cardiac catheterization: Secondary | ICD-10-CM

## 2019-11-06 DIAGNOSIS — N183 Chronic kidney disease, stage 3 unspecified: Secondary | ICD-10-CM

## 2019-11-06 DIAGNOSIS — E785 Hyperlipidemia, unspecified: Secondary | ICD-10-CM

## 2019-11-06 DIAGNOSIS — I4891 Unspecified atrial fibrillation: Secondary | ICD-10-CM | POA: Diagnosis not present

## 2019-11-06 DIAGNOSIS — I214 Non-ST elevation (NSTEMI) myocardial infarction: Secondary | ICD-10-CM | POA: Diagnosis not present

## 2019-11-06 DIAGNOSIS — Z9861 Coronary angioplasty status: Secondary | ICD-10-CM

## 2019-11-06 DIAGNOSIS — I255 Ischemic cardiomyopathy: Secondary | ICD-10-CM

## 2019-11-06 DIAGNOSIS — I2510 Atherosclerotic heart disease of native coronary artery without angina pectoris: Secondary | ICD-10-CM | POA: Insufficient documentation

## 2019-11-06 DIAGNOSIS — T148XXA Other injury of unspecified body region, initial encounter: Secondary | ICD-10-CM

## 2019-11-06 HISTORY — DX: Atherosclerotic heart disease of native coronary artery without angina pectoris: I25.10

## 2019-11-06 HISTORY — DX: Hyperlipidemia, unspecified: E78.5

## 2019-11-06 HISTORY — DX: Chronic kidney disease, stage 3 unspecified: N18.30

## 2019-11-06 HISTORY — DX: Other injury of unspecified body region, initial encounter: T14.8XXA

## 2019-11-06 HISTORY — DX: Ischemic cardiomyopathy: I25.5

## 2019-11-06 HISTORY — DX: Coronary angioplasty status: Z98.61

## 2019-11-06 NOTE — Patient Instructions (Addendum)
Medication Instructions:  CONTINUE TAKING PLAVIX AND ASPIRIN  *If you need a refill on your cardiac medications before your next appointment, please call your pharmacy*  Lab Work: NONE  If you have labs (blood work) drawn today and your tests are completely normal, you will receive your results only by: Marland Kitchen MyChart Message (if you have MyChart) OR . A paper copy in the mail If you have any lab test that is abnormal or we need to change your treatment, we will call you to review the results.  Testing/Procedures: NONE   Follow-Up: At [facility], you and your health needs are our priority.  As part of our continuing mission to provide you with exceptional heart care, we have created designated Provider Care Teams.  These Care Teams include your primary Cardiologist (physician) and Advanced Practice Providers (APPs -  Physician Assistants and Nurse Practitioners) who all work together to provide you with the care you need, when you need it.  Your next appointment:    FOLLOW UP AS SCHEDULED   The format for your next appointment:   In Person  Provider:   Jenne Campus, MD  Other Instructions DISABILITY [place]

## 2019-11-06 NOTE — Assessment & Plan Note (Signed)
New diagnosis 11/03/2019- not anticoagulated yet- minimal symptoms- rate controlled

## 2019-11-06 NOTE — Assessment & Plan Note (Signed)
S/P NSTEMI 11/03/2019

## 2019-11-06 NOTE — Assessment & Plan Note (Signed)
LDL 84- changed from Zocor to Lipitor 80 mg

## 2019-11-06 NOTE — Assessment & Plan Note (Signed)
LAD PCI with DES 11/02/2019

## 2019-11-06 NOTE — Assessment & Plan Note (Signed)
No ACE or ARB 

## 2019-11-06 NOTE — Progress Notes (Signed)
Cardiology Office Note:    Date:  11/06/2019   ID:  Melissa Clements, DOB May 06, 1946, MRN [id number]  PCP:  Melissa Caraway, MD  Cardiologist:  Melissa Campus, MD  Electrophysiologist:  None   Referring MD: Melissa Caraway, MD   No chief complaint on file. Post hospital follow up  History of Present Illness:    Melissa Clements is a 74 y.o. female with a hx of HTN, CRI-3, obesity, and HLD who presented 11/02/2019 with chest pain c/w with [place] though she admits her symptoms were mild- "4/10 chest discomfort".  Cath revealed 90% pLAD which was treated with a des.  She had no other significant CAD and her EF was 45%.  Post PCI she developed a hematoma of her Rt forearm and anticoagulation had to be stopped.  The plan was to resume Xarelto and stop ASA if her arm was improved. She is in the office today for follow up. She denies chest pain.  She remains in AF with controlled rate on exam and is not symptomatic with this.   Past Medical History:  Diagnosis Date  . Arthritis    end stage right hip  . Basal cell carcinoma   . Chronic kidney disease    stage III, related to high blood pressure medication  . Colon polyp   . Depression   . History of chest pain   . History of dizziness   . History of palpitations   . Hyperlipidemia   . Hypertension     Past Surgical History:  Procedure Laterality Date  . CATARACT EXTRACTION, BILATERAL    . COLONOSCOPY    . CORONARY STENT INTERVENTION N/A 11/02/2019   Procedure: CORONARY STENT INTERVENTION;  Surgeon: Melissa Man, MD;  Location: [place] [facility];  Service: Cardiovascular;  Laterality: N/A;  . DIAGNOSTIC LAPAROSCOPY    . DILATION AND CURETTAGE OF UTERUS    . LEFT HEART CATH AND CORONARY ANGIOGRAPHY N/A 11/02/2019   Procedure: LEFT HEART CATH AND CORONARY ANGIOGRAPHY;  Surgeon: Melissa Man, MD;  Location: [place] [facility];  Service: Cardiovascular;  Laterality: N/A;  . TOTAL HIP ARTHROPLASTY Right 01/24/2018   Procedure: RIGHT  TOTAL HIP ARTHROPLASTY ANTERIOR APPROACH;  Surgeon: Melissa Cancel, MD;  Location: [facility];  Service: Orthopedics;  Laterality: Right;  70 mins    Current Medications: Current Meds  Medication Sig  . aspirin EC 81 MG EC tablet Take 1 tablet (81 mg total) by mouth daily.  Marland Kitchen atorvastatin (LIPITOR) 80 MG tablet Take 1 tablet (80 mg total) by mouth daily at 6 PM.  . Cholecalciferol (VITAMIN D) 2000 units tablet Take 2,000 Units by mouth daily.   . clopidogrel (PLAVIX) 75 MG tablet Take 1 tablet (75 mg total) by mouth daily with breakfast.  . Collagen-Boron-Hyaluronic Acid ([employer] JOINT HEALTH TRIPLE ACTION PO) Take 1 capsule by mouth daily.  Marland Kitchen docusate sodium (COLACE) 100 MG capsule Take 1 capsule (100 mg total) by mouth 2 (two) times daily. (Patient taking differently: Take 100 mg by mouth daily. )  . metoprolol succinate (TOPROL-XL) 25 MG 24 hr tablet Take 1 tablet (25 mg total) by mouth daily.  . nitroGLYCERIN (NITROSTAT) 0.4 MG SL tablet Place 1 tablet (0.4 mg total) under the tongue every 5 (five) minutes x 3 doses as needed for chest pain.     Allergies:   Patient has no known allergies.   Social History   Socioeconomic History  . Marital status: Married    Spouse  name: Melissa Clements  . Number of children: 1  . Years of education: college  . Highest education level: Not on file  Occupational History  . Occupation: retired  Tobacco Use  . Smoking status: Never Smoker  . Smokeless tobacco: Never Used  Substance and Sexual Activity  . Alcohol use: No    Alcohol/week: 0.0 standard drinks  . Drug use: No  . Sexual activity: Not on file  Other Topics Concern  . Not on file  Social History Narrative  . Not on file   Social Determinants of Health   Financial Resource Strain:   . Difficulty of Paying Living Expenses: Not on file  Food Insecurity:   . Worried About Charity fundraiser in the Last Year: Not on file  . Ran Out of Food in the Last Year: Not on file  Transportation Needs:   .  Lack of Transportation (Medical): Not on file  . Lack of Transportation (Non-Medical): Not on file  Physical Activity:   . Days of Exercise per Week: Not on file  . Minutes of Exercise per Session: Not on file  Stress:   . Feeling of Stress : Not on file  Social Connections:   . Frequency of Communication with Friends and Family: Not on file  . Frequency of Social Gatherings with Friends and Family: Not on file  . Attends Religious Services: Not on file  . Active Member of Clubs or Organizations: Not on file  . Attends Archivist Meetings: Not on file  . Marital Status: Not on file     Family History: The patient's family history includes Alzheimer's disease in her brother and mother; Diabetes in her mother; Heart attack (age of onset: 49) in her father; Heart disease in her father.  ROS:   Please see the history of present illness.     All other systems reviewed and are negative.  EKGs/Labs/Other Studies Reviewed:    The following studies were reviewed today: Cath/ PCI 11/02/2019  EKG:  EKG is not ordered today.   Recent Labs: 11/01/2019: B Natriuretic Peptide 410.5; Magnesium 2.1; TSH 3.181 11/03/2019: BUN 14; Creatinine, Ser 1.00; Hemoglobin 12.2; Platelets 170; Potassium 4.2; Sodium 139  Recent Lipid Panel    Component Value Date/Time   CHOL 153 11/02/2019 0344   TRIG 55 11/02/2019 0344   HDL 58 11/02/2019 0344   CHOLHDL 2.6 11/02/2019 0344   VLDL 11 11/02/2019 0344   LDLCALC 84 11/02/2019 0344    Physical Exam:    VS:  BP 140/78   Pulse (!) 103   Temp (!) 97.1 F (36.2 C)   Ht 5\' 5"  (1.651 m)   Wt 246 lb 1.6 oz (111.6 kg)   SpO2 96%   BMI 40.95 kg/m     Wt Readings from Last 3 Encounters:  11/06/19 246 lb 1.6 oz (111.6 kg)  11/03/19 244 lb 11.4 oz (111 kg)  11/01/19 237 lb (107.5 kg)     GEN: Overweight Caucasian female, well developed in no acute distress HEENT: Normal NECK: No JVD; No carotid bruits CARDIAC: irregularly irregular;ar no  murmurs, rubs, gallops RESPIRATORY:  Clear to auscultation without rales, wheezing or rhonchi  ABDOMEN: Obese, non-tender, non-distended MUSCULOSKELETAL:  No edema; No deformity  SKIN: Warm and dry-extensive ecchymosis rt forearm from her wrist to elbow. This is soft, still tender. no loss of use or numbness in her rt hand NEUROLOGIC:  Alert and oriented x 3 PSYCHIATRIC:  Normal affect   ASSESSMENT:  NSTEMI (non-ST elevated myocardial infarction) ([place]) S/P NSTEMI 11/03/2019  CAD S/P percutaneous coronary angioplasty LAD PCI with DES 11/02/2019  New onset a-fib [facility]) New diagnosis 11/03/2019- not anticoagulated yet- minimal symptoms- rate controlled  Hematoma Hematoma and ecchymosis Rt radial site post PCI-(anticoagulation for new AF held).   Ischemic cardiomyopathy EF 45%  CRI (chronic renal insufficiency), stage 3 (moderate) No ACE or ARB  Dyslipidemia, goal LDL below 70 LDL 84- changed from Zocor to Lipitor 80 mg  PLAN:    I asked Dr Gwenlyn Found to take a look at her Rt arm with me.  We both feel uncomfortable starting Xarelto at this time.  He suggests re evaluation at F/U 11/16/2019.  He suggest Plavix and Xarelto then if she is improved- drop the ASA.    Medication Adjustments/Labs and Tests Ordered: Current medicines are reviewed at length with the patient today.  Concerns regarding medicines are outlined above.  No orders of the defined types were placed in this encounter.  No orders of the defined types were placed in this encounter.   There are no Patient Instructions on file for this visit.   Signed, Kerin Ransom, PA-C  11/06/2019 1:58 PM    [facility]

## 2019-11-06 NOTE — Progress Notes (Signed)
Referral received and verified for MD signature.  Follow up appt is 2/18 /21. Due to the senior leadership directed instructions to pause onsite cardiac rehab in response to the surge of Covid + patients within the health system staff were deployed to support health system and community needs.  Will wait to check pt insurance benefits and eligibility once onsite cardiac rehab is permitted to reopen. Pt will be contacted for scheduling [facility] [place] upon satisfactorily completing their follow up appt on 11/22/19. Cherre Huger, BSN Cardiac and Training and development officer

## 2019-11-06 NOTE — Assessment & Plan Note (Signed)
EF 45%

## 2019-11-06 NOTE — Assessment & Plan Note (Signed)
Hematoma and ecchymosis Rt radial site post PCI-(anticoagulation for new AF held).

## 2019-11-07 ENCOUNTER — Telehealth: Payer: Self-pay | Admitting: Emergency Medicine

## 2019-11-07 NOTE — Telephone Encounter (Signed)
Left message for patient to return call. She needs a TOC call and looks like she had questions about her medications.

## 2019-11-07 NOTE — Telephone Encounter (Signed)
Follow Up  Patient returning call. Please give patient a call back.  

## 2019-11-08 NOTE — Telephone Encounter (Addendum)
Patient contacted regarding discharge from [facility] on 11/03/2019.  Patient understands to follow up with provider Dr. Agustin Cree on 11/22/2019  at 135 pm  at [facility]. Patient understands discharge instructions? Yes  Patient understands medications and regiment? Yes  Patient understands to bring all medications to this visit? Yes   Ask patient:  Are you enrolled in My Chart (yes or no)  If no ask patient if they would like to enroll.    Postop Surgical Patients:                What is your wound status? Any signs/ symptoms of infection (Temp, redness/ red streaks, swelling, purulent drainage, foul odor or smell)? Swollen and bruising from wrist to elbow.  .             Please do not place any creams/ lotions/ or antibiotic ointment on any surgical incisions/ wounds without physician approval. .             Do you have any questions about your medications?  No  .             How is your pain controlled? Pain level? Pain in right arm pain level 4 .             If you require a refill on pain medications, know that the same medication/ amount may not be prescribed or a refill may not be given.  Please contact your pharmacy for refill requests.  .             Do you have help at home with ADL's? Her husband and daughter are there helping her.  .             Please refer to your Pre/post surgery booklet, there is a lot of useful information in it that may answer any questions you may have. .             Please note that it is ok to remove your surgical dressing, shower (soap/ water), and pat the incision dry.   During call patient reports Bruising (black and blue) and swelling to right arm from wrist to elbow with some pain as well. She saw Kerin Ransom, [place] on Tuesday and he advised her to be seen sooner. I have moved her appointment up to tomorrow with Dr. Agustin Cree. Advised her to call us if anything changes.

## 2019-11-09 ENCOUNTER — Ambulatory Visit (INDEPENDENT_AMBULATORY_CARE_PROVIDER_SITE_OTHER): Payer: Medicare Other | Admitting: Cardiology

## 2019-11-09 ENCOUNTER — Other Ambulatory Visit: Payer: Self-pay

## 2019-11-09 ENCOUNTER — Encounter: Payer: Self-pay | Admitting: Cardiology

## 2019-11-09 VITALS — BP 140/82 | HR 101 | Ht 65.0 in | Wt 248.0 lb

## 2019-11-09 DIAGNOSIS — Z9861 Coronary angioplasty status: Secondary | ICD-10-CM

## 2019-11-09 DIAGNOSIS — T148XXA Other injury of unspecified body region, initial encounter: Secondary | ICD-10-CM

## 2019-11-09 DIAGNOSIS — I10 Essential (primary) hypertension: Secondary | ICD-10-CM

## 2019-11-09 DIAGNOSIS — I97630 Postprocedural hematoma of a circulatory system organ or structure following a cardiac catheterization: Secondary | ICD-10-CM

## 2019-11-09 DIAGNOSIS — E785 Hyperlipidemia, unspecified: Secondary | ICD-10-CM

## 2019-11-09 DIAGNOSIS — I4891 Unspecified atrial fibrillation: Secondary | ICD-10-CM

## 2019-11-09 DIAGNOSIS — I2510 Atherosclerotic heart disease of native coronary artery without angina pectoris: Secondary | ICD-10-CM | POA: Diagnosis not present

## 2019-11-09 DIAGNOSIS — I255 Ischemic cardiomyopathy: Secondary | ICD-10-CM

## 2019-11-09 NOTE — Patient Instructions (Signed)
Medication Instructions:  Your physician recommends that you continue on your current medications as directed. Please refer to the Current Medication list given to you today.  *If you need a refill on your cardiac medications before your next appointment, please call your pharmacy*  Lab Work: None.  If you have labs (blood work) drawn today and your tests are completely normal, you will receive your results only by: Marland Kitchen MyChart Message (if you have MyChart) OR . A paper copy in the mail If you have any lab test that is abnormal or we need to change your treatment, we will call you to review the results.  Testing/Procedures: None.   Follow-Up: At [facility], you and your health needs are our priority.  As part of our continuing mission to provide you with exceptional heart care, we have created designated Provider Care Teams.  These Care Teams include your primary Cardiologist (physician) and Advanced Practice Providers (APPs -  Physician Assistants and Nurse Practitioners) who all work together to provide you with the care you need, when you need it.  Your next appointment:   1 week(s)  The format for your next appointment:   In Person  Provider:   Jenne Campus, MD  Other Instructions

## 2019-11-09 NOTE — Progress Notes (Signed)
Cardiology Office Note:    Date:  11/09/2019   ID:  [name], DOB 25-Jan-1946, MRN [id number]  PCP:  [name], MD  Cardiologist:  Jenne Campus, MD    Referring MD: [name], MD   Chief Complaint  Patient presents with  . Follow-up    Cath FU - Bruising RT Arm     History of Present Illness:    Melissa Clements is a 74 y.o. female with past medical history significant for recent anterior wall myocardial infarction.  Actually last week she came to our office in [facility].  She was complaining of having chest pain for about 24 hours EKG showed new Q waves.  Cardiac catheterization showed 90% proximal and mid LAD that is being addressed with drug-eluting stent.  She was also found to have new onset of atrial fibrillation.  Her hospital course after angioplasty has been complicated by hematoma on the right arm.  She was on triple therapy which includes aspirin and Plavix as well as Xarelto.  However, because of hematoma of the right arm her Xarelto has been withdrawn temporarily.  She is coming today 2 months for follow-up overall doing well denies have any chest pain, tightness, squeezing, pressure burning chest.  No palpitations does have some pain in her right arm in the place where the hematoma is located.  Past Medical History:  Diagnosis Date  . Arthritis    end stage right hip  . Basal cell carcinoma   . Chronic kidney disease    stage III, related to high blood pressure medication  . Colon polyp   . Depression   . History of chest pain   . History of dizziness   . History of palpitations   . Hyperlipidemia   . Hypertension     Past Surgical History:  Procedure Laterality Date  . CATARACT EXTRACTION, BILATERAL    . COLONOSCOPY    . CORONARY STENT INTERVENTION N/A 11/02/2019   Procedure: CORONARY STENT INTERVENTION;  Surgeon: Leonie Man, MD;  Location: [place] [facility];  Service: Cardiovascular;  Laterality: N/A;  . DIAGNOSTIC LAPAROSCOPY    .  DILATION AND CURETTAGE OF UTERUS    . LEFT HEART CATH AND CORONARY ANGIOGRAPHY N/A 11/02/2019   Procedure: LEFT HEART CATH AND CORONARY ANGIOGRAPHY;  Surgeon: Leonie Man, MD;  Location: [place] [facility];  Service: Cardiovascular;  Laterality: N/A;  . TOTAL HIP ARTHROPLASTY Right 01/24/2018   Procedure: RIGHT TOTAL HIP ARTHROPLASTY ANTERIOR APPROACH;  Surgeon: Paralee Cancel, MD;  Location: [facility];  Service: Orthopedics;  Laterality: Right;  70 mins    Current Medications: Current Meds  Medication Sig  . aspirin EC 81 MG EC tablet Take 1 tablet (81 mg total) by mouth daily.  Marland Kitchen atorvastatin (LIPITOR) 80 MG tablet Take 1 tablet (80 mg total) by mouth daily at 6 PM.  . Cholecalciferol (VITAMIN D) 2000 units tablet Take 2,000 Units by mouth daily.   . clopidogrel (PLAVIX) 75 MG tablet Take 1 tablet (75 mg total) by mouth daily with breakfast.  . Collagen-Boron-Hyaluronic Acid ([employer] JOINT HEALTH TRIPLE ACTION PO) Take 1 capsule by mouth daily.  Marland Kitchen docusate sodium (COLACE) 100 MG capsule Take 1 capsule (100 mg total) by mouth 2 (two) times daily. (Patient taking differently: Take 100 mg by mouth daily. )  . metoprolol succinate (TOPROL-XL) 25 MG 24 hr tablet Take 1 tablet (25 mg total) by mouth daily.  . nitroGLYCERIN (NITROSTAT) 0.4 MG SL tablet  Place 1 tablet (0.4 mg total) under the tongue every 5 (five) minutes x 3 doses as needed for chest pain.     Allergies:   Patient has no known allergies.   Social History   Socioeconomic History  . Marital status: Married    Spouse name: Melissa Clements  . Number of children: 1  . Years of education: college  . Highest education level: Not on file  Occupational History  . Occupation: retired  Tobacco Use  . Smoking status: Never Smoker  . Smokeless tobacco: Never Used  Substance and Sexual Activity  . Alcohol use: No    Alcohol/week: 0.0 standard drinks  . Drug use: No  . Sexual activity: Not on file  Other Topics Concern  . Not on file  Social  History Narrative  . Not on file   Social Determinants of Health   Financial Resource Strain:   . Difficulty of Paying Living Expenses: Not on file  Food Insecurity:   . Worried About Charity fundraiser in the Last Year: Not on file  . Ran Out of Food in the Last Year: Not on file  Transportation Needs:   . Lack of Transportation (Medical): Not on file  . Lack of Transportation (Non-Medical): Not on file  Physical Activity:   . Days of Exercise per Week: Not on file  . Minutes of Exercise per Session: Not on file  Stress:   . Feeling of Stress : Not on file  Social Connections:   . Frequency of Communication with Friends and Family: Not on file  . Frequency of Social Gatherings with Friends and Family: Not on file  . Attends Religious Services: Not on file  . Active Member of Clubs or Organizations: Not on file  . Attends Archivist Meetings: Not on file  . Marital Status: Not on file     Family History: The patient's family history includes Alzheimer's disease in her brother and mother; Diabetes in her mother; Heart attack (age of onset: 32) in her father; Heart disease in her father. ROS:   Please see the history of present illness.    All 14 point review of systems negative except as described per history of present illness  EKGs/Labs/Other Studies Reviewed:      Recent Labs: 11/01/2019: B Natriuretic Peptide 410.5; Magnesium 2.1; TSH 3.181 11/03/2019: BUN 14; Creatinine, Ser 1.00; Hemoglobin 12.2; Platelets 170; Potassium 4.2; Sodium 139  Recent Lipid Panel    Component Value Date/Time   CHOL 153 11/02/2019 0344   TRIG 55 11/02/2019 0344   HDL 58 11/02/2019 0344   CHOLHDL 2.6 11/02/2019 0344   VLDL 11 11/02/2019 0344   LDLCALC 84 11/02/2019 0344    Physical Exam:    VS:  BP 140/82   Pulse (!) 101   Ht 5\' 5"  (1.651 m)   Wt 248 lb (112.5 kg)   SpO2 97%   BMI 41.27 kg/m     Wt Readings from Last 3 Encounters:  11/09/19 248 lb (112.5 kg)   11/06/19 246 lb 1.6 oz (111.6 kg)  11/03/19 244 lb 11.4 oz (111 kg)     GEN:  Well nourished, well developed in no acute distress HEENT: Normal NECK: No JVD; No carotid bruits LYMPHATICS: No lymphadenopathy CARDIAC: Irregularly irregular, no murmurs, no rubs, no gallops RESPIRATORY:  Clear to auscultation without rales, wheezing or rhonchi  ABDOMEN: Soft, non-tender, non-distended MUSCULOSKELETAL:  No edema; No deformity.  Right arm large hematoma, sensation and motion intact.  Capillary refill normal. SKIN: Warm and dry LOWER EXTREMITIES: no swelling NEUROLOGIC:  Alert and oriented x 3 PSYCHIATRIC:  Normal affect   ASSESSMENT:    1. CAD S/P percutaneous coronary angioplasty   2. Ischemic cardiomyopathy   3. Benign essential HTN   4. Hematoma   5. Dyslipidemia, goal LDL below 70    PLAN:    In order of problems listed above:  1. CAD, status post PTCA and angioplasty of her left anterior descending artery proximal mid.  I independently reviewed her cardiac catheterization.  I did review this with her medications.  I stressed importance of taking dual antiplatelets therapy.  We discussed needs to use nitroglycerin and with instruction to go to the emergency room if nitroglycerin does not relieve the pain. 2. Ischemic cardiomyopathy.  Her ejection fraction was of 45% on echocardiogram done at the time of discharge.  She does have LAD territory hypokinesis.  There is a hope that that will improve with the timing after her artery has been opened up.  We will repeat echocardiogram within the next 3 months.  She is already on beta-blocker and then will try to increase the dose of this medication from 25 to 50 mg.  Also in the future we will check her Chem-7 to see if there is a role for ACE inhibitor.  She does have kidney dysfunction therefore we need to be careful. 3. Hematoma of the right arm.  Pulses present capillary refill good.  Sensation and motion intact.  Still however quite  significant.  It involves the entire forearm.  Tender to touch.  I decided to keep holding her Xarelto for another week or so and I will see her back in my office at the beginning of next week to determine if I can restart her Xarelto and discontinue aspirin at the time. 4. Dyslipidemia she is on high intensity statin and to Lipitor 80 and I explained to her why we did something like this. 5. A new onset of atrial fibrillation rate is still excessive.  I will increase the dose of her Toprol-XL from 25 to 50 mg.  Off anticoagulation for now.  I talked to her about the need to cardiovert her in about 4 weeks if she does not convert by herself.   Medication Adjustments/Labs and Tests Ordered: Current medicines are reviewed at length with the patient today.  Concerns regarding medicines are outlined above.  No orders of the defined types were placed in this encounter.  Medication changes: No orders of the defined types were placed in this encounter.   Signed, Park Liter, MD, [facility] 11/09/2019 1:47 PM    [place]

## 2019-11-09 NOTE — Progress Notes (Signed)
Called patient after she left the office. Dr. Agustin Cree advised she needs to increase metoprolol succinate to 50 mg daily. Patient verbally understood. No further questions.

## 2019-11-11 ENCOUNTER — Ambulatory Visit: Payer: Medicare Other | Attending: Internal Medicine

## 2019-11-11 DIAGNOSIS — Z23 Encounter for immunization: Secondary | ICD-10-CM | POA: Insufficient documentation

## 2019-11-11 NOTE — Progress Notes (Signed)
   Covid-19 Vaccination Clinic  Name:  Melissa Clements    MRN: [id number] DOB: [birth year]  11/11/2019  [name] was observed post Covid-19 immunization for 15 minutes without incidence. She was provided with Vaccine Information Sheet and instruction to access the V-Safe system.   [name] was instructed to call 911 with any severe reactions post vaccine: Marland Kitchen Difficulty breathing  . Swelling of your face and throat  . A fast heartbeat  . A bad rash all over your body  . Dizziness and weakness    Immunizations Administered    Name Date Dose VIS Date Route   Pfizer COVID-19 Vaccine 11/11/2019 11:21 AM 0.3 mL 09/14/2019 Intramuscular   Manufacturer: [place]   Lot: [id number]   [place]: [id number]

## 2019-11-16 ENCOUNTER — Ambulatory Visit (INDEPENDENT_AMBULATORY_CARE_PROVIDER_SITE_OTHER): Payer: Medicare Other | Admitting: Cardiology

## 2019-11-16 ENCOUNTER — Other Ambulatory Visit: Payer: Self-pay

## 2019-11-16 ENCOUNTER — Encounter: Payer: Self-pay | Admitting: Cardiology

## 2019-11-16 VITALS — BP 108/80 | HR 110 | Temp 97.0°F | Ht 64.0 in | Wt 240.6 lb

## 2019-11-16 DIAGNOSIS — I255 Ischemic cardiomyopathy: Secondary | ICD-10-CM | POA: Diagnosis not present

## 2019-11-16 DIAGNOSIS — Z9861 Coronary angioplasty status: Secondary | ICD-10-CM

## 2019-11-16 DIAGNOSIS — E785 Hyperlipidemia, unspecified: Secondary | ICD-10-CM

## 2019-11-16 DIAGNOSIS — I2510 Atherosclerotic heart disease of native coronary artery without angina pectoris: Secondary | ICD-10-CM | POA: Diagnosis not present

## 2019-11-16 DIAGNOSIS — I97630 Postprocedural hematoma of a circulatory system organ or structure following a cardiac catheterization: Secondary | ICD-10-CM | POA: Diagnosis not present

## 2019-11-16 DIAGNOSIS — T148XXA Other injury of unspecified body region, initial encounter: Secondary | ICD-10-CM

## 2019-11-16 MED ORDER — RIVAROXABAN 20 MG PO TABS: 20.0000 mg | ORAL_TABLET | Freq: Every day | ORAL | 12 refills | Status: DC

## 2019-11-16 NOTE — Patient Instructions (Signed)
Medication Instructions:  Your physician has recommended you make the following change in your medication:   Stop Aspirin Start Xarelto 20 mg daily. *If you need a refill on your cardiac medications before your next appointment, please call your pharmacy*  Lab Work: You had a cbc today. If you have labs (blood work) drawn today and your tests are completely normal, you will receive your results only by: Marland Kitchen MyChart Message (if you have MyChart) OR . A paper copy in the mail If you have any lab test that is abnormal or we need to change your treatment, we will call you to review the results.  Testing/Procedures: None ordered  Follow-Up: At [facility], you and your health needs are our priority.  As part of our continuing mission to provide you with exceptional heart care, we have created designated Provider Care Teams.  These Care Teams include your primary Cardiologist (physician) and Advanced Practice Providers (APPs -  Physician Assistants and Nurse Practitioners) who all work together to provide you with the care you need, when you need it.  Your next appointment:   1 month(s)  The format for your next appointment:   In Person  Provider:   Jenne Campus, MD  Other Instructions NA

## 2019-11-16 NOTE — Addendum Note (Signed)
Addended by: Truddie Hidden on: 11/16/2019 02:48 PM   Modules accepted: Orders

## 2019-11-16 NOTE — Progress Notes (Signed)
Cardiology Office Note:    Date:  11/16/2019   ID:  ROWENA FRIENDS, DOB 11/08/1945, MRN [id number]  PCP:  [name], MD  Cardiologist:  Jenne Campus, MD    Referring MD: [name], MD   No chief complaint on file.   History of Present Illness:    Melissa Clements is a 74 y.o. female  with a hx of HTN, CRI-3, obesity, and HLD who presented 11/02/2019 with chest pain c/w with [place] though she admits her symptoms were mild- "4/10 chest discomfort".  Cath revealed 90% pLAD which was treated with a des.  She had no other significant CAD and her EF was 45%.  Post PCI she developed a hematoma of her Rt forearm and anticoagulation had to be stopped.  She comes today for follow-up.  Right arm hematoma looks stable.  During getting worse actually getting better.  And she feels better.  Denies having any chest pain tightness squeezing pressure burning chest, no cardiac complaint.  She does complain however of being weak tired and exhausted.  Past Medical History:  Diagnosis Date  . Arthritis    end stage right hip  . Basal cell carcinoma   . Chronic kidney disease    stage III, related to high blood pressure medication  . Colon polyp   . Depression   . History of chest pain   . History of dizziness   . History of palpitations   . Hyperlipidemia   . Hypertension     Past Surgical History:  Procedure Laterality Date  . CATARACT EXTRACTION, BILATERAL    . COLONOSCOPY    . CORONARY STENT INTERVENTION N/A 11/02/2019   Procedure: CORONARY STENT INTERVENTION;  Surgeon: Leonie Man, MD;  Location: [facility];  Service: Cardiovascular;  Laterality: N/A;  . DIAGNOSTIC LAPAROSCOPY    . DILATION AND CURETTAGE OF UTERUS    . LEFT HEART CATH AND CORONARY ANGIOGRAPHY N/A 11/02/2019   Procedure: LEFT HEART CATH AND CORONARY ANGIOGRAPHY;  Surgeon: Leonie Man, MD;  Location: [facility];  Service: Cardiovascular;  Laterality: N/A;  . TOTAL HIP ARTHROPLASTY Right  01/24/2018   Procedure: RIGHT TOTAL HIP ARTHROPLASTY ANTERIOR APPROACH;  Surgeon: Paralee Cancel, MD;  Location: [facility];  Service: Orthopedics;  Laterality: Right;  70 mins    Current Medications: Current Meds  Medication Sig  . aspirin EC 81 MG EC tablet Take 1 tablet (81 mg total) by mouth daily.  Marland Kitchen atorvastatin (LIPITOR) 80 MG tablet Take 1 tablet (80 mg total) by mouth daily at 6 PM.  . Cholecalciferol (VITAMIN D) 2000 units tablet Take 2,000 Units by mouth daily.   . clopidogrel (PLAVIX) 75 MG tablet Take 1 tablet (75 mg total) by mouth daily with breakfast.  . Collagen-Boron-Hyaluronic Acid ([employer] JOINT HEALTH TRIPLE ACTION PO) Take 1 capsule by mouth daily.  Marland Kitchen docusate sodium (COLACE) 100 MG capsule Take 1 capsule (100 mg total) by mouth 2 (two) times daily. (Patient taking differently: Take 100 mg by mouth daily. )  . metoprolol succinate (TOPROL-XL) 25 MG 24 hr tablet Take 1 tablet (25 mg total) by mouth daily. (Patient taking differently: Take 50 mg by mouth daily. )  . nitroGLYCERIN (NITROSTAT) 0.4 MG SL tablet Place 1 tablet (0.4 mg total) under the tongue every 5 (five) minutes x 3 doses as needed for chest pain.     Allergies:   Patient has no known allergies.   Social History   Socioeconomic History  .  Marital status: Married    Spouse name: [name]  . Number of children: 1  . Years of education: college  . Highest education level: Not on file  Occupational History  . Occupation: retired  Tobacco Use  . Smoking status: Never Smoker  . Smokeless tobacco: Never Used  Substance and Sexual Activity  . Alcohol use: No    Alcohol/week: 0.0 standard drinks  . Drug use: No  . Sexual activity: Not on file  Other Topics Concern  . Not on file  Social History Narrative  . Not on file   Social Determinants of Health   Financial Resource Strain:   . Difficulty of Paying Living Expenses: Not on file  Food Insecurity:   . Worried About Charity fundraiser in the Last Year: Not  on file  . Ran Out of Food in the Last Year: Not on file  Transportation Needs:   . Lack of Transportation (Medical): Not on file  . Lack of Transportation (Non-Medical): Not on file  Physical Activity:   . Days of Exercise per Week: Not on file  . Minutes of Exercise per Session: Not on file  Stress:   . Feeling of Stress : Not on file  Social Connections:   . Frequency of Communication with Friends and Family: Not on file  . Frequency of Social Gatherings with Friends and Family: Not on file  . Attends Religious Services: Not on file  . Active Member of Clubs or Organizations: Not on file  . Attends Archivist Meetings: Not on file  . Marital Status: Not on file     Family History: The patient's family history includes Alzheimer's disease in her brother and mother; Diabetes in her mother; Heart attack (age of onset: 79) in her father; Heart disease in her father. ROS:   Please see the history of present illness.    All 14 point review of systems negative except as described per history of present illness  EKGs/Labs/Other Studies Reviewed:      Recent Labs: 11/01/2019: B Natriuretic Peptide 410.5; Magnesium 2.1; TSH 3.181 11/03/2019: BUN 14; Creatinine, Ser 1.00; Hemoglobin 12.2; Platelets 170; Potassium 4.2; Sodium 139  Recent Lipid Panel    Component Value Date/Time   CHOL 153 11/02/2019 0344   TRIG 55 11/02/2019 0344   HDL 58 11/02/2019 0344   CHOLHDL 2.6 11/02/2019 0344   VLDL 11 11/02/2019 0344   LDLCALC 84 11/02/2019 0344    Physical Exam:    VS:  BP 108/80   Pulse (!) 110   Temp (!) 97 F (36.1 C)   Ht 5\' 4"  (1.626 m)   Wt 240 lb 9.6 oz (109.1 kg)   SpO2 97%   BMI 41.30 kg/m     Wt Readings from Last 3 Encounters:  11/16/19 240 lb 9.6 oz (109.1 kg)  11/09/19 248 lb (112.5 kg)  11/06/19 246 lb 1.6 oz (111.6 kg)     GEN:  Well nourished, well developed in no acute distress HEENT: Normal NECK: No JVD; No carotid bruits LYMPHATICS: No  lymphadenopathy CARDIAC: RRR, no murmurs, no rubs, no gallops RESPIRATORY:  Clear to auscultation without rales, wheezing or rhonchi  ABDOMEN: Soft, non-tender, non-distended MUSCULOSKELETAL:  No edema; No deformity  SKIN: Warm and dry LOWER EXTREMITIES: no swelling NEUROLOGIC:  Alert and oriented x 3 PSYCHIATRIC:  Normal affect  Right adrenal hematoma much improved.  Change in color to yellow already still some tenderness is present but overall much improved.  ASSESSMENT:    1. CAD S/P percutaneous coronary angioplasty   2. Ischemic cardiomyopathy   3. Dyslipidemia, goal LDL below 70   4. Hematoma    PLAN:    In order of problems listed above:  1. Coronary disease stable after stent implantation.  Doing well from that point review.  On dual antiplatelets therapy, however also have atrial fibrillation and required triple therapy.  Recommendation by interventional team wants to start her on anticoagulation with Xarelto 20 mg daily, discontinue aspirin, continue with Plavix as dosing currently.  Her hematoma is stable therefore we will be able to do that maneuver. 2. Ischemic cardiomyopathy.  Because of low blood pressure I cannot add an ACE inhibitor, I cannot increase dose of beta-blocker because of bradycardia previously as well.  Therefore we will continue present medications. 3. Dyslipidemia she is on high intensity statin which I will continue 4. When I see her we will try to recheck her fasting lipid profile 5. We did talk about rehab and I recommend to hold on this until her arm will get better.   Medication Adjustments/Labs and Tests Ordered: Current medicines are reviewed at length with the patient today.  Concerns regarding medicines are outlined above.  No orders of the defined types were placed in this encounter.  Medication changes: No orders of the defined types were placed in this encounter.   Signed, Park Liter, MD, [facility] 11/16/2019 2:37 PM    [place]

## 2019-11-17 LAB — CBC WITH DIFFERENTIAL/PLATELET
Basophils Absolute: 0 10*3/uL (ref 0.0–0.2)
Basos: 0 %
EOS (ABSOLUTE): 0.2 10*3/uL (ref 0.0–0.4)
Eos: 3 %
Hematocrit: 43.9 % (ref 34.0–46.6)
Hemoglobin: 14.6 g/dL (ref 11.1–15.9)
Immature Grans (Abs): 0 10*3/uL (ref 0.0–0.1)
Immature Granulocytes: 0 %
Lymphocytes Absolute: 1.6 10*3/uL (ref 0.7–3.1)
Lymphs: 26 %
MCH: 31.1 pg (ref 26.6–33.0)
MCHC: 33.3 g/dL (ref 31.5–35.7)
MCV: 93 fL (ref 79–97)
Monocytes Absolute: 0.6 10*3/uL (ref 0.1–0.9)
Monocytes: 9 %
Neutrophils Absolute: 3.9 10*3/uL (ref 1.4–7.0)
Neutrophils: 62 %
Platelets: 257 10*3/uL (ref 150–450)
RBC: 4.7 x10E6/uL (ref 3.77–5.28)
RDW: 13 % (ref 11.7–15.4)
WBC: 6.4 10*3/uL (ref 3.4–10.8)

## 2019-11-19 ENCOUNTER — Encounter (HOSPITAL_BASED_OUTPATIENT_CLINIC_OR_DEPARTMENT_OTHER): Payer: Self-pay

## 2019-11-19 ENCOUNTER — Other Ambulatory Visit: Payer: Self-pay

## 2019-11-19 ENCOUNTER — Emergency Department (HOSPITAL_BASED_OUTPATIENT_CLINIC_OR_DEPARTMENT_OTHER)
Admission: EM | Admit: 2019-11-19 | Discharge: 2019-11-19 | Disposition: A | Discharge status: Home / Self Care | Payer: Medicare Other | Attending: Emergency Medicine | Admitting: Emergency Medicine

## 2019-11-19 ENCOUNTER — Telehealth: Payer: Self-pay | Admitting: Cardiology

## 2019-11-19 DIAGNOSIS — N183 Chronic kidney disease, stage 3 unspecified: Secondary | ICD-10-CM | POA: Insufficient documentation

## 2019-11-19 DIAGNOSIS — R040 Epistaxis: Secondary | ICD-10-CM | POA: Insufficient documentation

## 2019-11-19 DIAGNOSIS — Z96641 Presence of right artificial hip joint: Secondary | ICD-10-CM | POA: Insufficient documentation

## 2019-11-19 DIAGNOSIS — I129 Hypertensive chronic kidney disease with stage 1 through stage 4 chronic kidney disease, or unspecified chronic kidney disease: Secondary | ICD-10-CM | POA: Diagnosis not present

## 2019-11-19 DIAGNOSIS — I2510 Atherosclerotic heart disease of native coronary artery without angina pectoris: Secondary | ICD-10-CM | POA: Insufficient documentation

## 2019-11-19 DIAGNOSIS — Z79899 Other long term (current) drug therapy: Secondary | ICD-10-CM | POA: Insufficient documentation

## 2019-11-19 MED ORDER — SILVER NITRATE-POT NITRATE 75-25 % EX MISC
CUTANEOUS | Status: AC
  Filled 2019-11-19: qty 10

## 2019-11-19 MED ORDER — LIDOCAINE-EPINEPHRINE (PF) 2 %-1:200000 IJ SOLN
10.0000 mL | Freq: Once | INTRAMUSCULAR | Status: AC
  Administered 2019-11-19: 10 mL
  Filled 2019-11-19: qty 10

## 2019-11-19 MED ORDER — OXYMETAZOLINE HCL 0.05 % NA SOLN
1.0000 | Freq: Once | NASAL | Status: AC
  Administered 2019-11-19: 1 via NASAL
  Filled 2019-11-19: qty 30

## 2019-11-19 MED ORDER — COCAINE HCL 4 % EX SOLN
4.0000 mL | Freq: Once | CUTANEOUS | Status: DC
  Filled 2019-11-19: qty 4

## 2019-11-19 NOTE — ED Triage Notes (Addendum)
Pt c/o nosebleed since 230am-"constant slow dripping"-NAD-steady gait-pt states she started xarelto x 2 doses and takes plavix

## 2019-11-19 NOTE — Telephone Encounter (Signed)
If nosebleed is continuing, she needs to go to ED or urgent care to get it cauterized/packed.

## 2019-11-19 NOTE — Telephone Encounter (Signed)
New Message  Pt c/o medication issue:  1. Name of Medication: rivaroxaban (XARELTO) 20 MG TABS tablet    atorvastatin (LIPITOR) 80 MG tablet  2. How are you currently taking this medication (dosage and times per day)? As directed  3. Are you having a reaction (difficulty breathing--STAT)? No  4. What is your medication issue? Patient woke up with a nose bleed and states she has not been able to get it to stop. Please give patient a call back to advise.

## 2019-11-19 NOTE — ED Notes (Signed)
ED Provider at bedside. 

## 2019-11-19 NOTE — Telephone Encounter (Signed)
Spoke with patient. Patient reports nose is still bleeding. Recommended patient go to the nearest ER or Urgent Care to have nose bleed assessed. Patient verbalized understanding.

## 2019-11-19 NOTE — ED Notes (Signed)
Patient stated that it still bleds a little bit.  Nasal packing done by EDP using a cotton ball saturated with lido

## 2019-11-19 NOTE — Discharge Instructions (Addendum)
Please follow-up with your primary care provider regarding today's encounter.  Continue taking your medications, as prescribed.  Please read the attachment of nosebleeds.  Return to the ED or seek immediate medical attention for any new or worsening symptoms.

## 2019-11-19 NOTE — ED Provider Notes (Addendum)
[facility] EMERGENCY DEPARTMENT Provider Note   CSN: [id number] Arrival date & time: 11/19/19  1128     History Chief Complaint  Patient presents with  . Epistaxis    Melissa Clements is a 74 y.o. female with PMH significant for atrial fibrillation and CAD s/p left heart cath and coronary angiography on Plavix and Xarelto who presents to the ED with complaints of atraumatic epistaxis.  Patient reports that she was going to the bathroom at approximately 2:30 AM when she developed a nosebleed from her left nare.  She denies any injury, cough, sneezing, nose picking, or other precipitating event.  She has attempted to cease the bleeding using compression, however has been unsuccessful with prompt her to come to the ED for intervention.  She denies any significant bleeding, lightheadedness, dizziness, headache, chest pain, recent illness, fevers or chills, difficulty swallowing, sore throat, or other symptoms.   HPI     Past Medical History:  Diagnosis Date  . Arthritis    end stage right hip  . Basal cell carcinoma   . Chronic kidney disease    stage III, related to high blood pressure medication  . Colon polyp   . Depression   . History of chest pain   . History of dizziness   . History of palpitations   . Hyperlipidemia   . Hypertension     Patient Active Problem List   Diagnosis Date Noted  . CAD S/P percutaneous coronary angioplasty 11/06/2019  . Hematoma 11/06/2019  . Ischemic cardiomyopathy 11/06/2019  . CRI (chronic renal insufficiency), stage 3 (moderate) 11/06/2019  . Dyslipidemia, goal LDL below 70 11/06/2019  . Persistent atrial fibrillation ([place])   . Dyspnea on exertion 11/01/2019  . NSTEMI (non-ST elevated myocardial infarction) ([place]) 11/01/2019  . Obese 01/25/2018  . S/P right THA, AA 01/24/2018  . Sinus bradycardia 01/20/2018  . Precordial chest pain 01/23/2015  . Palpitations 01/23/2015  . Benign essential HTN 01/23/2015  . Dizziness  01/23/2015  . Basal cell carcinoma   . Colon polyp     Past Surgical History:  Procedure Laterality Date  . CATARACT EXTRACTION, BILATERAL    . COLONOSCOPY    . CORONARY STENT INTERVENTION N/A 11/02/2019   Procedure: CORONARY STENT INTERVENTION;  Surgeon: Leonie Man, MD;  Location: [facility];  Service: Cardiovascular;  Laterality: N/A;  . DIAGNOSTIC LAPAROSCOPY    . DILATION AND CURETTAGE OF UTERUS    . LEFT HEART CATH AND CORONARY ANGIOGRAPHY N/A 11/02/2019   Procedure: LEFT HEART CATH AND CORONARY ANGIOGRAPHY;  Surgeon: Leonie Man, MD;  Location: [place] [facility];  Service: Cardiovascular;  Laterality: N/A;  . TOTAL HIP ARTHROPLASTY Right 01/24/2018   Procedure: RIGHT TOTAL HIP ARTHROPLASTY ANTERIOR APPROACH;  Surgeon: Paralee Cancel, MD;  Location: [facility];  Service: Orthopedics;  Laterality: Right;  70 mins     OB History   No obstetric history on file.     Family History  Problem Relation Age of Onset  . Heart attack Father 78  . Heart disease Father   . Diabetes Mother   . Alzheimer's disease Mother   . Alzheimer's disease Brother     Social History   Tobacco Use  . Smoking status: Never Smoker  . Smokeless tobacco: Never Used  Substance Use Topics  . Alcohol use: No    Alcohol/week: 0.0 standard drinks  . Drug use: No    Home Medications Prior to Admission medications   Medication  Sig Start Date End Date Taking? Authorizing Provider  atorvastatin (LIPITOR) 80 MG tablet Take 1 tablet (80 mg total) by mouth daily at 6 PM. 11/03/19  Yes Duke, Tami Lin, PA  Cholecalciferol (VITAMIN D) 2000 units tablet Take 2,000 Units by mouth daily.    Yes [provider]  clopidogrel (PLAVIX) 75 MG tablet Take 1 tablet (75 mg total) by mouth daily with breakfast. 11/04/19  Yes Duke, Tami Lin, PA  Collagen-Boron-Hyaluronic Acid ([employer] JOINT HEALTH TRIPLE ACTION PO) Take 1 capsule by mouth daily.   Yes [provider]  docusate sodium  (COLACE) 100 MG capsule Take 1 capsule (100 mg total) by mouth 2 (two) times daily. Patient taking differently: Take 100 mg by mouth daily.  01/24/18  Yes Babish, Rodman Key, PA-C  metoprolol succinate (TOPROL-XL) 25 MG 24 hr tablet Take 1 tablet (25 mg total) by mouth daily. Patient taking differently: Take 50 mg by mouth daily.  11/04/19  Yes Duke, Tami Lin, PA  rivaroxaban (XARELTO) 20 MG TABS tablet Take 1 tablet (20 mg total) by mouth daily with supper. 11/16/19  Yes Park Liter, MD  nitroGLYCERIN (NITROSTAT) 0.4 MG SL tablet Place 1 tablet (0.4 mg total) under the tongue every 5 (five) minutes x 3 doses as needed for chest pain. 11/03/19   Ledora Bottcher, PA    Allergies    Patient has no known allergies.  Review of Systems   Review of Systems  HENT: Positive for nosebleeds. Negative for trouble swallowing.   Neurological: Negative for dizziness, weakness and numbness.    Physical Exam Updated Vital Signs BP 129/83 (BP Location: Right Arm)   Pulse 68   Temp 97.8 F (36.6 C) (Oral)   Resp 16   Ht 5\' 4"  (1.626 m)   Wt 108.9 kg   SpO2 97%   BMI 41.20 kg/m   Physical Exam Vitals and nursing note reviewed. Exam conducted with a chaperone present.  Constitutional:      Appearance: Normal appearance.  HENT:     Head: Normocephalic and atraumatic.     Nose:     Comments: Slow bleeding from left nare.  No significant hemorrhage.  After examination, cannot identify specific source of bleeding.  No bleeding from right nare.    Mouth/Throat:     Comments: No evidence of bleeding in oropharynx.  Normal exam. Eyes:     General: No scleral icterus.    Conjunctiva/sclera: Conjunctivae normal.  Cardiovascular:     Rate and Rhythm: Normal rate. Rhythm irregular.     Pulses: Normal pulses.     Heart sounds: Normal heart sounds.  Pulmonary:     Effort: Pulmonary effort is normal. No respiratory distress.     Breath sounds: Normal breath sounds.  Skin:    General: Skin  is dry.     Capillary Refill: Capillary refill takes less than 2 seconds.  Neurological:     Mental Status: She is alert and oriented to person, place, and time.     GCS: GCS eye subscore is 4. GCS verbal subscore is 5. GCS motor subscore is 6.  Psychiatric:        Mood and Affect: Mood normal.        Behavior: Behavior normal.        Thought Content: Thought content normal.       ED Results / Procedures / Treatments   Labs (all labs ordered are listed, but only abnormal results are displayed) Labs Reviewed - No  data to display  EKG None  Radiology No results found.  Procedures Procedures (including critical care time)  Medications Ordered in ED Medications  silver nitrate applicators [id number] % applicator (has no administration in time range)  oxymetazoline (AFRIN) 0.05 % nasal spray 1 spray (1 spray Each Nare Given 11/19/19 1158)  lidocaine-EPINEPHrine (XYLOCAINE W/EPI) 2 %-1:200000 (PF) injection 10 mL (10 mLs Other Given by Other 11/19/19 1303)    ED Course  I have reviewed the triage vital signs and the nursing notes.  Pertinent labs & imaging results that were available during my care of the patient were reviewed by me and considered in my medical decision making (see chart for details).    MDM Rules/Calculators/A&P                      Patient is resting comfortably on examination.  No significant hemorrhage.  Mild oozing of blood from left nare.  Instructed patient to blow out her numerous, dark clots from left nare before placing Afrin-soaked cottonball in affected nare with compression x30 minutes.  After reevaluation, patient continues to ooze.  Will once again have patient blow nose before placing lidocaine with epinephrine soaked cottonball with additional 30 minutes compression.  If hemostasis still not achieved, will place [employer] and refer patient to ENT for removal in 2 to 3 days.  Hemostasis was achieved after soaking a cotton ball and lidocaine  epinephrine and applying pressure for 30 minutes.  Do not feel as though Rhino Rocket is warranted at this time.  Patient feels entirely improved and is no longer bleeding.  She feels prepared for discharge.  Patient states that there has not been any significant bleeding and she is in no acute acute distress.  She is hemodynamically stable and denies any symptoms concerning for anemia.  Do not feel as though laboratory work-up is warranted at this time.  Discussed with Dr. Shipman Singer who agrees with assessment and plan.  Patient is to continue her medications, as prescribed.  At time of discharge, patient reports that she feels a little continued oozing.  On reevaluation, I can identify a potential bleeder.  Will provide chemical cautery.  On reevaluation, hemostasis achieved entirely with silver nitrate chemical cautery.  Patient feels entirely improved and is very pleased with today's intervention.   Final Clinical Impression(s) / ED Diagnoses Final diagnoses:  Epistaxis    Rx / DC Orders ED Discharge Orders    None       Corena Herter, PA-C 11/19/19 1402    Corena Herter, PA-C 11/19/19 1454    Virgel Manifold, MD 11/20/19 1246

## 2019-11-22 ENCOUNTER — Ambulatory Visit: Payer: Medicare Other | Admitting: Cardiology

## 2019-11-29 ENCOUNTER — Ambulatory Visit: Payer: Medicare Other

## 2019-11-30 ENCOUNTER — Telehealth (HOSPITAL_COMMUNITY): Payer: Self-pay

## 2019-11-30 NOTE — Telephone Encounter (Signed)
Pt insurance is active and benefits verified through [facility] Medicare Co-pay 0, DED 0/0 met, out of pocket $3,600/$180 met, co-insurance 0%. no pre-authorization required. Passport, 11/30/2019@4 :05pm, REF# [id number]  Will contact patient to see if she is interested in the Cardiac Rehab Program. If interested, patient will need to complete follow up appt. Once completed, patient will be contacted for scheduling upon review by the RN Navigator.

## 2019-12-05 ENCOUNTER — Ambulatory Visit: Payer: Medicare Other | Attending: Internal Medicine

## 2019-12-05 ENCOUNTER — Ambulatory Visit: Payer: Medicare Other

## 2019-12-05 DIAGNOSIS — Z23 Encounter for immunization: Secondary | ICD-10-CM | POA: Insufficient documentation

## 2019-12-05 NOTE — Progress Notes (Signed)
   Covid-19 Vaccination Clinic  Name:  Melissa Clements    MRN: [id number] DOB: January 21, 1946  12/05/2019  Melissa Clements was observed post Covid-19 immunization for 15 minutes without incident. She was provided with Vaccine Information Sheet and instruction to access the V-Safe system.   Melissa Clements was instructed to call 911 with any severe reactions post vaccine: Marland Kitchen Difficulty breathing  . Swelling of face and throat  . A fast heartbeat  . A bad rash all over body  . Dizziness and weakness   Immunizations Administered    Name Date Dose VIS Date Route   Pfizer COVID-19 Vaccine 12/05/2019 10:20 AM 0.3 mL 09/14/2019 Intramuscular   Manufacturer: [place]   Lot: [id number]   [place]: [id number]

## 2019-12-14 ENCOUNTER — Ambulatory Visit: Payer: Medicare Other | Admitting: Cardiology

## 2019-12-24 ENCOUNTER — Encounter: Payer: Self-pay | Admitting: Emergency Medicine

## 2019-12-24 ENCOUNTER — Ambulatory Visit: Payer: Medicare Other | Admitting: Cardiology

## 2019-12-24 ENCOUNTER — Encounter: Payer: Self-pay | Admitting: Cardiology

## 2019-12-24 ENCOUNTER — Other Ambulatory Visit: Payer: Self-pay

## 2019-12-24 VITALS — BP 122/84 | HR 83 | Ht 64.0 in | Wt 234.8 lb

## 2019-12-24 DIAGNOSIS — I10 Essential (primary) hypertension: Secondary | ICD-10-CM | POA: Diagnosis not present

## 2019-12-24 DIAGNOSIS — I2510 Atherosclerotic heart disease of native coronary artery without angina pectoris: Secondary | ICD-10-CM

## 2019-12-24 DIAGNOSIS — Z9861 Coronary angioplasty status: Secondary | ICD-10-CM

## 2019-12-24 DIAGNOSIS — I4819 Other persistent atrial fibrillation: Secondary | ICD-10-CM | POA: Diagnosis not present

## 2019-12-24 DIAGNOSIS — E785 Hyperlipidemia, unspecified: Secondary | ICD-10-CM | POA: Diagnosis not present

## 2019-12-24 DIAGNOSIS — R040 Epistaxis: Secondary | ICD-10-CM

## 2019-12-24 NOTE — Progress Notes (Signed)
Cardiology Office Note:    Date:  12/24/2019   ID:  Melissa Clements, DOB [birth year], MRN [id number]  PCP:  [name], MD  Cardiologist:  Jenne Campus, MD    Referring MD: [name], MD   No chief complaint on file. I have epistaxis  History of Present Illness:    Melissa Clements is a 74 y.o. female with past medical history significant for coronary artery disease status post PTCA and drug-eluting stents to LAD done in November 02, 2019 in face of late presentation of myocardial infarction, also history of essential hypertension, dyslipidemia, atrial fibrillation.  She comes today 2 months for follow-up.  The biggest problem appears to be nosebleed.  Since have seen her last time she got 8 times situation that she had bleeding from the nose.  One time required ER visit.  She usually can take care of this using some Afrin and simply holding the pressure but that became very annoying.  At least she is on not with need to take to medication to thin her blood.  Denies having any chest pain, tightness, pressure, burning in the chest.  Past Medical History:  Diagnosis Date  . Arthritis    end stage right hip  . Basal cell carcinoma   . Benign essential HTN 01/23/2015  . CAD S/P percutaneous coronary angioplasty 11/06/2019   LAD PCI with DES 11/02/2019  . Chronic kidney disease    stage III, related to high blood pressure medication  . Colon polyp   . CRI (chronic renal insufficiency), stage 3 (moderate) 11/06/2019   No ACE or ARB  . Depression   . Dizziness 01/23/2015  . Dyslipidemia, goal LDL below 70 11/06/2019   LDL 84- changed from Zocor to Lipitor 80 mg  . Dyspnea on exertion 11/01/2019  . Hematoma 11/06/2019   Hematoma and ecchymosis Rt radial site post PCI-(anticoagulation for new AF held).   . History of chest pain   . History of dizziness   . History of palpitations   . Hyperlipidemia   . Hypertension   . Ischemic cardiomyopathy 11/06/2019   EF 45%  . NSTEMI (non-ST  elevated myocardial infarction) ([place]) 11/01/2019   S/P NSTEMI 11/03/2019  . Obese 01/25/2018  . Palpitations 01/23/2015  . Persistent atrial fibrillation ([place])    New diagnosis 11/03/2019- not anticoagulated yet- minimal symptoms- rate controlled  . Precordial chest pain 01/23/2015   Negative stress test in the spring 2019  . S/P right THA, AA 01/24/2018  . Sinus bradycardia 01/20/2018    Past Surgical History:  Procedure Laterality Date  . CATARACT EXTRACTION, BILATERAL    . COLONOSCOPY    . CORONARY STENT INTERVENTION N/A 11/02/2019   Procedure: CORONARY STENT INTERVENTION;  Surgeon: Leonie Man, MD;  Location: [facility];  Service: Cardiovascular;  Laterality: N/A;  . DIAGNOSTIC LAPAROSCOPY    . DILATION AND CURETTAGE OF UTERUS    . LEFT HEART CATH AND CORONARY ANGIOGRAPHY N/A 11/02/2019   Procedure: LEFT HEART CATH AND CORONARY ANGIOGRAPHY;  Surgeon: Leonie Man, MD;  Location: [facility];  Service: Cardiovascular;  Laterality: N/A;  . TOTAL HIP ARTHROPLASTY Right 01/24/2018   Procedure: RIGHT TOTAL HIP ARTHROPLASTY ANTERIOR APPROACH;  Surgeon: Paralee Cancel, MD;  Location: [facility];  Service: Orthopedics;  Laterality: Right;  70 mins    Current Medications: Current Meds  Medication Sig  . atorvastatin (LIPITOR) 80 MG tablet Take 1 tablet (80 mg total) by mouth daily at 6  PM.  . Cholecalciferol (VITAMIN D) 2000 units tablet Take 2,000 Units by mouth daily.   . clopidogrel (PLAVIX) 75 MG tablet Take 1 tablet (75 mg total) by mouth daily with breakfast.  . Collagen-Boron-Hyaluronic Acid ([employer] JOINT HEALTH TRIPLE ACTION PO) Take 1 capsule by mouth daily.  Marland Kitchen docusate sodium (COLACE) 100 MG capsule Take 100 mg by mouth daily.  . metoprolol succinate (TOPROL-XL) 50 MG 24 hr tablet Take 50 mg by mouth daily.  . nitroGLYCERIN (NITROSTAT) 0.4 MG SL tablet Place 1 tablet (0.4 mg total) under the tongue every 5 (five) minutes x 3 doses as needed for chest pain.  . rivaroxaban  (XARELTO) 20 MG TABS tablet Take 1 tablet (20 mg total) by mouth daily with supper.     Allergies:   Patient has no known allergies.   Social History   Socioeconomic History  . Marital status: Married    Spouse name: [name]  . Number of children: 1  . Years of education: college  . Highest education level: Not on file  Occupational History  . Occupation: retired  Tobacco Use  . Smoking status: Never Smoker  . Smokeless tobacco: Never Used  Substance and Sexual Activity  . Alcohol use: No    Alcohol/week: 0.0 standard drinks  . Drug use: No  . Sexual activity: Not on file  Other Topics Concern  . Not on file  Social History Narrative  . Not on file   Social Determinants of Health   Financial Resource Strain:   . Difficulty of Paying Living Expenses:   Food Insecurity:   . Worried About Charity fundraiser in the Last Year:   . Arboriculturist in the Last Year:   Transportation Needs:   . Film/video editor (Medical):   Marland Kitchen Lack of Transportation (Non-Medical):   Physical Activity:   . Days of Exercise per Week:   . Minutes of Exercise per Session:   Stress:   . Feeling of Stress :   Social Connections:   . Frequency of Communication with Friends and Family:   . Frequency of Social Gatherings with Friends and Family:   . Attends Religious Services:   . Active Member of Clubs or Organizations:   . Attends Archivist Meetings:   Marland Kitchen Marital Status:      Family History: The patient's family history includes Alzheimer's disease in her brother and mother; Diabetes in her mother; Heart attack (age of onset: 32) in her father; Heart disease in her father. ROS:   Please see the history of present illness.    All 14 point review of systems negative except as described per history of present illness  EKGs/Labs/Other Studies Reviewed:      Recent Labs: 11/01/2019: B Natriuretic Peptide 410.5; Magnesium 2.1; TSH 3.181 11/03/2019: BUN 14; Creatinine, Ser 1.00;  Potassium 4.2; Sodium 139 11/16/2019: Hemoglobin 14.6; Platelets 257  Recent Lipid Panel    Component Value Date/Time   CHOL 153 11/02/2019 0344   TRIG 55 11/02/2019 0344   HDL 58 11/02/2019 0344   CHOLHDL 2.6 11/02/2019 0344   VLDL 11 11/02/2019 0344   LDLCALC 84 11/02/2019 0344    Physical Exam:    VS:  BP 122/84 (BP Location: Right Arm, Patient Position: Sitting, Cuff Size: Large)   Pulse 83   Ht 5\' 4"  (1.626 m)   Wt 234 lb 12.8 oz (106.5 kg)   SpO2 99%   BMI 40.30 kg/m     Wt  Readings from Last 3 Encounters:  12/24/19 234 lb 12.8 oz (106.5 kg)  11/19/19 240 lb (108.9 kg)  11/16/19 240 lb 9.6 oz (109.1 kg)     GEN:  Well nourished, well developed in no acute distress HEENT: Normal NECK: No JVD; No carotid bruits LYMPHATICS: No lymphadenopathy CARDIAC: Irregularly irregular, no murmurs, no rubs, no gallops RESPIRATORY:  Clear to auscultation without rales, wheezing or rhonchi  ABDOMEN: Soft, non-tender, non-distended MUSCULOSKELETAL:  No edema; No deformity  SKIN: Warm and dry LOWER EXTREMITIES: no swelling NEUROLOGIC:  Alert and oriented x 3 PSYCHIATRIC:  Normal affect   ASSESSMENT:    1. Persistent atrial fibrillation ([place])   2. CAD S/P percutaneous coronary angioplasty   3. Benign essential HTN   4. Dyslipidemia, goal LDL below 70    PLAN:    In order of problems listed above:  1. Persistent atrial fibrillation.  Still in it.  We talked about options for this situation.  We decided to postpone decision about potential cardioversion until next month.  She will think about it and she will let me know which when she wants to go.  In the meantime we will continue with anticoagulation.  Rate is controlled and she seems to be doing well. 2. Coronary artery disease status post PTCA and stenting of her left anterior descending artery 2 months ago.  Stable on Plavix which I will continue. 3. Essential hypertension blood pressure well controlled continue present  management. 4. Dyslipidemia we will check her fasting blood profile today. 5. Epistaxis: I will refer her to ENT for evaluation. 6. I will also give her letter that she is ready to go back to work she works once a week on Wednesday for a few hours.   Medication Adjustments/Labs and Tests Ordered: Current medicines are reviewed at length with the patient today.  Concerns regarding medicines are outlined above.  No orders of the defined types were placed in this encounter.  Medication changes: No orders of the defined types were placed in this encounter.   Signed, Park Liter, MD, [facility] 12/24/2019 4:24 PM    [place]

## 2019-12-24 NOTE — Addendum Note (Signed)
Addended by: Ashok Norris on: 12/24/2019 04:42 PM   Modules accepted: Orders

## 2019-12-24 NOTE — Patient Instructions (Signed)
Medication Instructions:  Your physician recommends that you continue on your current medications as directed. Please refer to the Current Medication list given to you today.  *If you need a refill on your cardiac medications before your next appointment, please call your pharmacy*   Lab Work: Your physician recommends that you return for lab work today: lipid   If you have labs (blood work) drawn today and your tests are completely normal, you will receive your results only by: Marland Kitchen MyChart Message (if you have MyChart) OR . A paper copy in the mail If you have any lab test that is abnormal or we need to change your treatment, we will call you to review the results.   Testing/Procedures: Your physician has requested that you have an echocardiogram. Echocardiography is a painless test that uses sound waves to create images of your heart. It provides your doctor with information about the size and shape of your heart and how well your heart's chambers and valves are working. This procedure takes approximately one hour. There are no restrictions for this procedure.     Follow-Up: At [facility], you and your health needs are our priority.  As part of our continuing mission to provide you with exceptional heart care, we have created designated Provider Care Teams.  These Care Teams include your primary Cardiologist (physician) and Advanced Practice Providers (APPs -  Physician Assistants and Nurse Practitioners) who all work together to provide you with the care you need, when you need it.  We recommend signing up for the patient portal called "MyChart".  Sign up information is provided on this After Visit Summary.  MyChart is used to connect with patients for Virtual Visits (Telemedicine).  Patients are able to view lab/test results, encounter notes, upcoming appointments, etc.  Non-urgent messages can be sent to your provider as well.   To learn more about what you can do with MyChart, go to  [email or web address].    Your next appointment:   5 week(s)  The format for your next appointment:   In Person  Provider:   Jenne Campus, MD   Other Instructions  Dr. Agustin Cree has referred you to ENT. They should call within 1 week for an appointment if not please call our office.   Echocardiogram An echocardiogram is a procedure that uses painless sound waves (ultrasound) to produce an image of the heart. Images from an echocardiogram can provide important information about:  Signs of coronary artery disease (CAD).  Aneurysm detection. An aneurysm is a weak or damaged part of an artery wall that bulges out from the normal force of blood pumping through the body.  Heart size and shape. Changes in the size or shape of the heart can be associated with certain conditions, including heart failure, aneurysm, and CAD.  Heart muscle function.  Heart valve function.  Signs of a past heart attack.  Fluid buildup around the heart.  Thickening of the heart muscle.  A tumor or infectious growth around the heart valves. Tell a health care provider about:  Any allergies you have.  All medicines you are taking, including vitamins, herbs, eye drops, creams, and over-the-counter medicines.  Any blood disorders you have.  Any surgeries you have had.  Any medical conditions you have.  Whether you are pregnant or may be pregnant. What are the risks? Generally, this is a safe procedure. However, problems may occur, including:  Allergic reaction to dye (contrast) that may be used during the procedure. What happens before  the procedure? No specific preparation is needed. You may eat and drink normally. What happens during the procedure?   An IV tube may be inserted into one of your veins.  You may receive contrast through this tube. A contrast is an injection that improves the quality of the pictures from your heart.  A gel will be applied to your chest.  A wand-like  tool (transducer) will be moved over your chest. The gel will help to transmit the sound waves from the transducer.  The sound waves will harmlessly bounce off of your heart to allow the heart images to be captured in real-time motion. The images will be recorded on a computer. The procedure may vary among health care providers and hospitals. What happens after the procedure?  You may return to your normal, everyday life, including diet, activities, and medicines, unless your health care provider tells you not to do that. Summary  An echocardiogram is a procedure that uses painless sound waves (ultrasound) to produce an image of the heart.  Images from an echocardiogram can provide important information about the size and shape of your heart, heart muscle function, heart valve function, and fluid buildup around your heart.  You do not need to do anything to prepare before this procedure. You may eat and drink normally.  After the echocardiogram is completed, you may return to your normal, everyday life, unless your health care provider tells you not to do that. This information is not intended to replace advice given to you by your health care provider. Make sure you discuss any questions you have with your health care provider. Document Revised: 01/11/2019 Document Reviewed: 10/23/2016 Elsevier Patient Education  [place].

## 2019-12-25 LAB — LIPID PANEL
Chol/HDL Ratio: 2.3 ratio (ref 0.0–4.4)
Cholesterol, Total: 132 mg/dL (ref 100–199)
HDL: 57 mg/dL (ref >39)
LDL Chol Calc (NIH): 57 mg/dL (ref 0–99)
Triglycerides: 97 mg/dL (ref 0–149)
VLDL Cholesterol Cal: 18 mg/dL (ref 5–40)

## 2019-12-25 NOTE — Telephone Encounter (Signed)
Called patient to see if she was interested in participating in the Cardiac Rehab Program. Patient stated yes. Patient will come in for orientation on 01/10/2020@10 :00am and will attend the 9:00am exercise class.  Mailed homework package.

## 2019-12-26 ENCOUNTER — Encounter: Payer: Self-pay | Admitting: *Deleted

## 2019-12-27 ENCOUNTER — Ambulatory Visit (HOSPITAL_BASED_OUTPATIENT_CLINIC_OR_DEPARTMENT_OTHER)
Admission: RE | Admit: 2019-12-27 | Discharge: 2019-12-27 | Disposition: A | Discharge status: Home / Self Care | Payer: Medicare Other | Source: Ambulatory Visit | Attending: Cardiology | Admitting: Cardiology

## 2019-12-27 ENCOUNTER — Other Ambulatory Visit: Payer: Self-pay

## 2019-12-27 DIAGNOSIS — Z9861 Coronary angioplasty status: Secondary | ICD-10-CM | POA: Diagnosis present

## 2019-12-27 DIAGNOSIS — I4819 Other persistent atrial fibrillation: Secondary | ICD-10-CM

## 2019-12-27 DIAGNOSIS — I2510 Atherosclerotic heart disease of native coronary artery without angina pectoris: Secondary | ICD-10-CM | POA: Diagnosis present

## 2019-12-27 DIAGNOSIS — I10 Essential (primary) hypertension: Secondary | ICD-10-CM | POA: Diagnosis present

## 2019-12-27 NOTE — Progress Notes (Signed)
  Echocardiogram 2D Echocardiogram has been performed.  Melissa Clements 12/27/2019, 10:45 AM

## 2019-12-28 ENCOUNTER — Ambulatory Visit: Payer: Medicare Other | Admitting: Cardiology

## 2020-01-07 ENCOUNTER — Telehealth: Payer: Self-pay | Admitting: Cardiology

## 2020-01-07 DIAGNOSIS — R319 Hematuria, unspecified: Secondary | ICD-10-CM

## 2020-01-07 NOTE — Telephone Encounter (Signed)
Patient states she has been experiencing Hematuria since Saturday, 01/05/20. She has not had any other symptoms of frequency or incontinence, however she assumes the hematuria is due to her blood thinner. Please return call to advise.

## 2020-01-07 NOTE — Telephone Encounter (Signed)
I spoke with patient. She noticed blood in urine starting this past Saturday. Is present every time she urinates. Describes urine as cranberry colored. No UTI symptoms. On Plavix and Xarelto. No nosebleeds at current time and is seeing ENT on Thursday. She is continuing her current medications. Will send to Dr Agustin Cree for review/recommendations regarding hematuria.

## 2020-01-08 NOTE — Telephone Encounter (Signed)
Called patient .  Instruction  For lab work .  Patient was  Little upset  She did not get a reply  Ans she had to call  For information.  patient would like to know what she should do about  Taking Xarelto and Plavix.  she does have an office appointment to see an ENT on Thursday  Due to having recent nose nosebleeds

## 2020-01-08 NOTE — Telephone Encounter (Signed)
Please ask her to do urine analysis as well as CBC

## 2020-01-09 ENCOUNTER — Telehealth (HOSPITAL_COMMUNITY): Payer: Self-pay | Admitting: Student-PharmD

## 2020-01-09 LAB — URINALYSIS
Bilirubin, UA: NEGATIVE
Glucose, UA: NEGATIVE
Ketones, UA: NEGATIVE
Leukocytes,UA: NEGATIVE
Nitrite, UA: NEGATIVE
Specific Gravity, UA: 1.013 (ref 1.005–1.030)
Urobilinogen, Ur: 0.2 mg/dL (ref 0.2–1.0)
pH, UA: 5.5 (ref 5.0–7.5)

## 2020-01-09 LAB — CBC
Hematocrit: 43.5 % (ref 34.0–46.6)
Hemoglobin: 14.7 g/dL (ref 11.1–15.9)
MCH: 31.2 pg (ref 26.6–33.0)
MCHC: 33.8 g/dL (ref 31.5–35.7)
MCV: 92 fL (ref 79–97)
Platelets: 185 10*3/uL (ref 150–450)
RBC: 4.71 x10E6/uL (ref 3.77–5.28)
RDW: 12.8 % (ref 11.7–15.4)
WBC: 7.6 10*3/uL (ref 3.4–10.8)

## 2020-01-09 NOTE — Telephone Encounter (Signed)
Cardiac Rehab Medication Review by a Pharmacist  Does the patient  feel that his/her medications are working for him/her?  yes  Has the patient been experiencing any side effects to the medications prescribed?  Yes, patient reported having 8 nose bleeds and hematuria since starting Xarelto; this has been discussed with MD and MD advised patient to continue Xarelto.  Does the patient measure his/her own blood pressure or blood glucose at home?  Yes, patient states checking blood pressure periodically, SBPs 120-140, DBPs 70-86  Does the patient have any problems obtaining medications due to transportation or finances?   no  Understanding of regimen: excellent Understanding of indications: excellent Potential of compliance: excellent    Pharmacist comments: Mrs. Ixta was very aware of her medications and her regimen.   Georgeanne Frankland L. Devin Going, [place] PGY1 Pharmacy Resident [phone] 01/09/20      4:35 PM  Please check AMION for all [place] phone numbers After 10:00 PM, call the [place] [phone]

## 2020-01-09 NOTE — Telephone Encounter (Signed)
Called patient informed her per Dr. Agustin Cree to continue xarelto and plavix. She was informed her cbc was ok we are waiting for urine results. She will let us know if she needs anything else. No further questions.

## 2020-01-10 ENCOUNTER — Encounter (INDEPENDENT_AMBULATORY_CARE_PROVIDER_SITE_OTHER): Payer: Self-pay | Admitting: Otolaryngology

## 2020-01-10 ENCOUNTER — Telehealth: Payer: Self-pay | Admitting: Cardiology

## 2020-01-10 ENCOUNTER — Encounter (HOSPITAL_COMMUNITY): Payer: Self-pay

## 2020-01-10 ENCOUNTER — Other Ambulatory Visit: Payer: Self-pay

## 2020-01-10 ENCOUNTER — Encounter (HOSPITAL_COMMUNITY)
Admission: RE | Admit: 2020-01-10 | Discharge: 2020-01-10 | Disposition: A | Discharge status: Home / Self Care | Payer: Medicare Other | Source: Ambulatory Visit | Attending: Cardiology | Admitting: Cardiology

## 2020-01-10 ENCOUNTER — Ambulatory Visit (INDEPENDENT_AMBULATORY_CARE_PROVIDER_SITE_OTHER): Payer: Medicare Other | Admitting: Otolaryngology

## 2020-01-10 VITALS — Temp 97.3°F

## 2020-01-10 VITALS — BP 114/80 | HR 98 | Temp 95.2°F | Ht 63.25 in | Wt 232.6 lb

## 2020-01-10 DIAGNOSIS — R040 Epistaxis: Secondary | ICD-10-CM

## 2020-01-10 DIAGNOSIS — Z955 Presence of coronary angioplasty implant and graft: Secondary | ICD-10-CM | POA: Insufficient documentation

## 2020-01-10 DIAGNOSIS — I214 Non-ST elevation (NSTEMI) myocardial infarction: Secondary | ICD-10-CM | POA: Insufficient documentation

## 2020-01-10 NOTE — Telephone Encounter (Signed)
,   Okay lets hold off with rehab for now.  Also in the meantime please increase dose of metoprolol to 75 mg daily which will be 1-1/2 tablet that is metoprolol succinate

## 2020-01-10 NOTE — Telephone Encounter (Signed)
New message     Patient was at cardiac rehab this am.  She is in AFIB.  Melissa Clements will fax strips to the [place] office.  Patient has a low exercise tolerance.  Melissa Clements want to know if patient is going to have a cardioversion and if she should continue cardiac rehab while in AFIB, etc.

## 2020-01-10 NOTE — Telephone Encounter (Signed)
Melissa Clements from cardiac rehab called back and states that she has faxed strips over to the [place] office. She states that the patient does not have good rate control. Her limit at rehab is 112-113 and she got into the 130s today. She states that the patient cant walk without getting short of breath. She is not having any chest pain. Verdis Frederickson states that in her opinion it would be a good idea to hold off on cardiac rehab due to deconditioning and wait until after she follows up with Dr. Agustin Cree on 04/23 to resume.  She states that the patient is supposed to return on Monday to rehab and if it is determined that they want the patient to hold off on rehab to let the patient know. Can also call Verdis Frederickson back at her desk # [phone].

## 2020-01-10 NOTE — Telephone Encounter (Signed)
Attempted to call Verdis Frederickson back. No answer and could not leave a voicemail.  Will forward to Dr. Agustin Cree in regards to whether patient should continue with cardiac rehab.

## 2020-01-10 NOTE — Progress Notes (Signed)
Cardiac Individual Treatment Plan  Patient Details  Name: Melissa Clements MRN: [id number] Date of Birth: [birth year] Referring Provider:     CARDIAC REHAB PHASE II ORIENTATION from 01/10/2020 in [place]  Referring Provider  Park Liter MD      Initial Encounter Date:    CARDIAC REHAB PHASE II ORIENTATION from 01/10/2020 in [place]  Date  01/10/20      Visit Diagnosis: NSTEMI (non-ST elevated myocardial infarction) ([place]) 11/01/19  Status post coronary artery stent placement S/P DES LAD 11/02/19  Patient's Home Medications on Admission:  Current Outpatient Medications:  .  atorvastatin (LIPITOR) 80 MG tablet, Take 1 tablet (80 mg total) by mouth daily at 6 PM., Disp: 90 tablet, Rfl: 3 .  Cholecalciferol (VITAMIN D) 2000 units tablet, Take 2,000 Units by mouth every other day. , Disp: , Rfl:  .  clopidogrel (PLAVIX) 75 MG tablet, Take 1 tablet (75 mg total) by mouth daily with breakfast., Disp: 90 tablet, Rfl: 3 .  Collagen-Boron-Hyaluronic Acid ([employer] TRIPLE ACTION PO), Take 1 capsule by mouth daily., Disp: , Rfl:  .  docusate sodium (COLACE) 100 MG capsule, Take 100 mg by mouth daily., Disp: , Rfl:  .  metoprolol succinate (TOPROL-XL) 50 MG 24 hr tablet, Take 50 mg by mouth daily., Disp: , Rfl:  .  nitroGLYCERIN (NITROSTAT) 0.4 MG SL tablet, Place 1 tablet (0.4 mg total) under the tongue every 5 (five) minutes x 3 doses as needed for chest pain., Disp: 25 tablet, Rfl: 3 .  rivaroxaban (XARELTO) 20 MG TABS tablet, Take 1 tablet (20 mg total) by mouth daily with supper., Disp: 30 tablet, Rfl: 12  Past Medical History: Past Medical History:  Diagnosis Date  . Arthritis    end stage right hip  . Basal cell carcinoma   . Benign essential HTN 01/23/2015  . CAD S/P percutaneous coronary angioplasty 11/06/2019   LAD PCI with DES 11/02/2019  . Chronic kidney disease    stage III, related to high blood  pressure medication  . Colon polyp   . CRI (chronic renal insufficiency), stage 3 (moderate) 11/06/2019   No ACE or ARB  . Depression   . Dizziness 01/23/2015  . Dyslipidemia, goal LDL below 70 11/06/2019   LDL 84- changed from Zocor to Lipitor 80 mg  . Dyspnea on exertion 11/01/2019  . Hematoma 11/06/2019   Hematoma and ecchymosis Rt radial site post PCI-(anticoagulation for new AF held).   . History of chest pain   . History of dizziness   . History of palpitations   . Hyperlipidemia   . Hypertension   . Ischemic cardiomyopathy 11/06/2019   EF 45%  . NSTEMI (non-ST elevated myocardial infarction) ([place]) 11/01/2019   S/P NSTEMI 11/03/2019  . Obese 01/25/2018  . Palpitations 01/23/2015  . Persistent atrial fibrillation ([place])    New diagnosis 11/03/2019- not anticoagulated yet- minimal symptoms- rate controlled  . Precordial chest pain 01/23/2015   Negative stress test in the spring 2019  . S/P right THA, AA 01/24/2018  . Sinus bradycardia 01/20/2018    Tobacco Use: Social History   Tobacco Use  Smoking Status Never Smoker  Smokeless Tobacco Never Used    Labs: Recent Review Flowsheet Data    Labs for ITP Cardiac and Pulmonary Rehab Latest Ref Rng & Units 11/01/2019 11/02/2019 12/24/2019   Cholestrol 100 - 199 mg/dL 181 153 132   LDLCALC 0 - 99 mg/dL  NOT CALCULATED 84 57   HDL >39 mg/dL 66 58 57   Trlycerides 0 - 149 mg/dL 78 55 97   Hemoglobin A1c 4.8 - 5.6 % 5.5 - -      Capillary Blood Glucose: No results found for: GLUCAP   Exercise Target Goals: Exercise Program Goal: Individual exercise prescription set using results from initial 6 min walk test and THRR while considering  patient's activity barriers and safety.   Exercise Prescription Goal: Starting with aerobic activity 30 plus minutes a day, 3 days per week for initial exercise prescription. Provide home exercise prescription and guidelines that participant acknowledges understanding prior to discharge.  Activity  Barriers & Risk Stratification: Activity Barriers & Cardiac Risk Stratification - 01/10/20 1135      Activity Barriers & Cardiac Risk Stratification   Activity Barriers  Right Hip Replacement    Cardiac Risk Stratification  High       6 Minute Walk: 6 Minute Walk    Row Name 01/10/20 1129 01/10/20 1139       6 Minute Walk   Phase  Initial  -    Distance  739 feet  -    Walk Time  6 minutes 4 minutes total walking  -    # of Rest Breaks  1  -    MPH  1.4  -    METS  1.34  -    RPE  12  -    Perceived Dyspnea   2  -    VO2 Peak  4.7  -    Symptoms  No  Yes (comment)    Comments  -  Pt took 2 minute rest break due to SOB +2. Pt also reported 2/10 right hip pain    Resting HR  98 bpm  -    Resting BP  114/80  -    Resting Oxygen Saturation   98 %  -    Exercise Oxygen Saturation  during 6 min walk  98 %  -    Max Ex. HR  129 bpm  -    Max Ex. BP  142/86  -    2 Minute Post BP  120/76  -       Oxygen Initial Assessment:   Oxygen Re-Evaluation:   Oxygen Discharge (Final Oxygen Re-Evaluation):   Initial Exercise Prescription: Initial Exercise Prescription - 01/10/20 1100      Date of Initial Exercise RX and Referring Provider   Date  01/10/20    Referring Provider  Park Liter MD    Expected Discharge Date  03/07/20      NuStep   Level  2    SPM  85    Minutes  15    METs  2      Arm Ergometer   Level  1.5    Watts  15    Minutes  15    METs  2      Prescription Details   Frequency (times per week)  3x    Duration  Progress to 30 minutes of continuous aerobic without signs/symptoms of physical distress      Intensity   THRR 40-80% of Max Heartrate  58-112    Ratings of Perceived Exertion  11-13    Perceived Dyspnea  0-4      Progression   Progression  Continue progressive overload as per policy without signs/symptoms or physical distress.      Horticulturist, commercial  Prescription  Yes    Weight  2lbs    Reps  10-15        Perform Capillary Blood Glucose checks as needed.  Exercise Prescription Changes:   Exercise Comments:   Exercise Goals and Review: Exercise Goals    Row Name 01/10/20 1135             Exercise Goals   Increase Physical Activity  Yes       Intervention  Provide advice, education, support and counseling about physical activity/exercise needs.;Develop an individualized exercise prescription for aerobic and resistive training based on initial evaluation findings, risk stratification, comorbidities and participant's personal goals.       Expected Outcomes  Short Term: Attend rehab on a regular basis to increase amount of physical activity.;Long Term: Add in home exercise to make exercise part of routine and to increase amount of physical activity.;Long Term: Exercising regularly at least 3-5 days a week.       Increase Strength and Stamina  Yes       Intervention  Provide advice, education, support and counseling about physical activity/exercise needs.;Develop an individualized exercise prescription for aerobic and resistive training based on initial evaluation findings, risk stratification, comorbidities and participant's personal goals.       Expected Outcomes  Short Term: Increase workloads from initial exercise prescription for resistance, speed, and METs.;Short Term: Perform resistance training exercises routinely during rehab and add in resistance training at home;Long Term: Improve cardiorespiratory fitness, muscular endurance and strength as measured by increased METs and functional capacity (6MWT)       Able to understand and use rate of perceived exertion (RPE) scale  Yes       Intervention  Provide education and explanation on how to use RPE scale       Expected Outcomes  Short Term: Able to use RPE daily in rehab to express subjective intensity level;Long Term:  Able to use RPE to guide intensity level when exercising independently       Knowledge and understanding of Target  Heart Rate Range (THRR)  Yes       Intervention  Provide education and explanation of THRR including how the numbers were predicted and where they are located for reference       Expected Outcomes  Short Term: Able to state/look up THRR;Long Term: Able to use THRR to govern intensity when exercising independently;Short Term: Able to use daily as guideline for intensity in rehab       Able to check pulse independently  Yes       Intervention  Provide education and demonstration on how to check pulse in carotid and radial arteries.;Review the importance of being able to check your own pulse for safety during independent exercise       Expected Outcomes  Short Term: Able to explain why pulse checking is important during independent exercise;Long Term: Able to check pulse independently and accurately       Understanding of Exercise Prescription  Yes       Intervention  Provide education, explanation, and written materials on patient's individual exercise prescription       Expected Outcomes  Short Term: Able to explain program exercise prescription;Long Term: Able to explain home exercise prescription to exercise independently          Exercise Goals Re-Evaluation :    Discharge Exercise Prescription (Final Exercise Prescription Changes):   Nutrition:  Target Goals: Understanding of nutrition guidelines, daily intake of sodium 1500mg , cholesterol <  200mg , calories 30% from fat and 7% or less from saturated fats, daily to have 5 or more servings of fruits and vegetables.  Biometrics: Pre Biometrics - 01/10/20 1134      Pre Biometrics   Height  5' 3.25" (1.607 m)    Weight  105.5 kg    Waist Circumference  48 inches    Hip Circumference  55 inches    Waist to Hip Ratio  0.87 %    BMI (Calculated)  40.85    Triceps Skinfold  33 mm    % Body Fat  52.3 %    Grip Strength  26 kg    Flexibility  15.5 in    Single Leg Stand  15 seconds        Nutrition Therapy Plan and Nutrition  Goals:   Nutrition Assessments:   Nutrition Goals Re-Evaluation:   Nutrition Goals Discharge (Final Nutrition Goals Re-Evaluation):   Psychosocial: Target Goals: Acknowledge presence or absence of significant depression and/or stress, maximize coping skills, provide positive support system. Participant is able to verbalize types and ability to use techniques and skills needed for reducing stress and depression.  Initial Review & Psychosocial Screening: Initial Psych Review & Screening - 01/10/20 1255      Initial Review   Current issues with  Current Depression;Current Stress Concerns    Source of Stress Concerns  Chronic Illness;Family;Unable to perform yard/household activities    Comments  Jessical reports that she has been depressed due to family issues and decreased fitness level. Jacci does have home stressors with an adult daughter living at home      [place]?  Yes   Jyana has her husband for support   Comments  Lilibeth was offered to meet with a hospital counselor. Tyra declined at this time      Barriers   Psychosocial barriers to participate in program  The patient should benefit from training in stress management and relaxation.      Screening Interventions   Interventions  Encouraged to exercise;Provide feedback about the scores to participant;To provide support and resources with identified psychosocial needs    Expected Outcomes  Long Term Goal: Stressors or current issues are controlled or eliminated.;Short Term goal: Identification and review with participant of any Quality of Life or Depression concerns found by scoring the questionnaire.;Long Term goal: The participant improves quality of Life and PHQ9 Scores as seen by post scores and/or verbalization of changes       Quality of Life Scores: Quality of Life - 01/10/20 1124      Quality of Life   Select  Quality of Life      Quality of Life Scores   Health/Function Pre   16.54 %    Socioeconomic Pre  21.17 %    Psych/Spiritual Pre  17.5 %    Family Pre  14.8 %    GLOBAL Pre  17.34 %      Scores of 19 and below usually indicate a poorer quality of life in these areas.  A difference of  2-3 points is a clinically meaningful difference.  A difference of 2-3 points in the total score of the Quality of Life Index has been associated with significant improvement in overall quality of life, self-image, physical symptoms, and general health in studies assessing change in quality of life.  PHQ-9: Recent Review Flowsheet Data    There is no flowsheet data to display.     Interpretation  of Total Score  Total Score Depression Severity:  1-4 = Minimal depression, 5-9 = Mild depression, 10-14 = Moderate depression, 15-19 = Moderately severe depression, 20-27 = Severe depression   Psychosocial Evaluation and Intervention:   Psychosocial Re-Evaluation:   Psychosocial Discharge (Final Psychosocial Re-Evaluation):   Vocational Rehabilitation: Provide vocational rehab assistance to qualifying candidates.   Vocational Rehab Evaluation & Intervention: Vocational Rehab - 01/10/20 1125      Initial Vocational Rehab Evaluation & Intervention   Assessment shows need for Vocational Rehabilitation  No       Education: Education Goals: Education classes will be provided on a weekly basis, covering required topics. Participant will state understanding/return demonstration of topics presented.  Learning Barriers/Preferences: Learning Barriers/Preferences - 01/10/20 1140      Learning Barriers/Preferences   Learning Barriers  None    Learning Preferences  Written Material;Verbal Instruction;Skilled Demonstration       Education Topics: Hypertension, Hypertension Reduction -Define heart disease and high blood pressure. Discus how high blood pressure affects the body and ways to reduce high blood pressure.   Exercise and Your Heart -Discuss why it is important  to exercise, the FITT principles of exercise, normal and abnormal responses to exercise, and how to exercise safely.   Angina -Discuss definition of angina, causes of angina, treatment of angina, and how to decrease risk of having angina.   Cardiac Medications -Review what the following cardiac medications are used for, how they affect the body, and side effects that may occur when taking the medications.  Medications include Aspirin, Beta blockers, calcium channel blockers, ACE Inhibitors, angiotensin receptor blockers, diuretics, digoxin, and antihyperlipidemics.   Congestive Heart Failure -Discuss the definition of CHF, how to live with CHF, the signs and symptoms of CHF, and how keep track of weight and sodium intake.   Heart Disease and Intimacy -Discus the effect sexual activity has on the heart, how changes occur during intimacy as we age, and safety during sexual activity.   Smoking Cessation / COPD -Discuss different methods to quit smoking, the health benefits of quitting smoking, and the definition of COPD.   Nutrition I: Fats -Discuss the types of cholesterol, what cholesterol does to the heart, and how cholesterol levels can be controlled.   Nutrition II: Labels -Discuss the different components of food labels and how to read food label   Heart Parts/Heart Disease and PAD -Discuss the anatomy of the heart, the pathway of blood circulation through the heart, and these are affected by heart disease.   Stress I: Signs and Symptoms -Discuss the causes of stress, how stress may lead to anxiety and depression, and ways to limit stress.   Stress II: Relaxation -Discuss different types of relaxation techniques to limit stress.   Warning Signs of Stroke / TIA -Discuss definition of a stroke, what the signs and symptoms are of a stroke, and how to identify when someone is having stroke.   Knowledge Questionnaire Score: Knowledge Questionnaire Score - 01/10/20 1124       Knowledge Questionnaire Score   Pre Score  20/24       Core Components/Risk Factors/Patient Goals at Admission: Personal Goals and Risk Factors at Admission - 01/10/20 1300      Core Components/Risk Factors/Patient Goals on Admission    Weight Management  Yes;Obesity;Weight Maintenance;Weight Loss    Intervention  Weight Management: Develop a combined nutrition and exercise program designed to reach desired caloric intake, while maintaining appropriate intake of nutrient and fiber, sodium and fats,  and appropriate energy expenditure required for the weight goal.;Weight Management: Provide education and appropriate resources to help participant work on and attain dietary goals.;Weight Management/Obesity: Establish reasonable short term and long term weight goals.;Obesity: Provide education and appropriate resources to help participant work on and attain dietary goals.    Admit Weight  232 lb 9.4 oz (105.5 kg)    Hypertension  Yes    Intervention  Provide education on lifestyle modifcations including regular physical activity/exercise, weight management, moderate sodium restriction and increased consumption of fresh fruit, vegetables, and low fat dairy, alcohol moderation, and smoking cessation.;Monitor prescription use compliance.    Expected Outcomes  Short Term: Continued assessment and intervention until BP is < 140/54mm HG in hypertensive participants. < 130/77mm HG in hypertensive participants with diabetes, heart failure or chronic kidney disease.;Long Term: Maintenance of blood pressure at goal levels.    Lipids  Yes    Intervention  Provide education and support for participant on nutrition & aerobic/resistive exercise along with prescribed medications to achieve LDL 70mg , HDL >40mg .    Expected Outcomes  Short Term: Participant states understanding of desired cholesterol values and is compliant with medications prescribed. Participant is following exercise prescription and nutrition  guidelines.;Long Term: Cholesterol controlled with medications as prescribed, with individualized exercise RX and with personalized nutrition plan. Value goals: LDL < 70mg , HDL > 40 mg.    Stress  Yes    Intervention  Offer individual and/or small group education and counseling on adjustment to heart disease, stress management and health-related lifestyle change. Teach and support self-help strategies.;Refer participants experiencing significant psychosocial distress to appropriate mental health specialists for further evaluation and treatment. When possible, include family members and significant others in education/counseling sessions.    Expected Outcomes  Short Term: Participant demonstrates changes in health-related behavior, relaxation and other stress management skills, ability to obtain effective social support, and compliance with psychotropic medications if prescribed.;Long Term: Emotional wellbeing is indicated by absence of clinically significant psychosocial distress or social isolation.       Core Components/Risk Factors/Patient Goals Review:    Core Components/Risk Factors/Patient Goals at Discharge (Final Review):    ITP Comments: ITP Comments    Row Name 01/10/20 1028           ITP Comments  Dr. Fransico Him, Medical Director          Comments: Patient attended orientation on 01/10/2020 to review rules and guidelines for program.  Completed  4 out of the 6 minute walk test. Ivin Booty did have to stop and rest for two minutes due to complaints of shortness of breath and deconditioning.  Intitial ITP, and exercise prescription.  VSS. Telemetry-Atrial Fibrillation this has been previously documented. Resting rate in the 90's maximum heart rate noted at 129.  . Safety measures and social distancing in place per CDC guidelines. Will notify Dr Marcelline Deist office about today's walk test. Ivin Booty says she feels better since her stent placement but still feels short of breath with any  extended activity. [place] denied having any chest pain today.Barnet Pall, RN,BSN 01/10/2020 1:17 PM

## 2020-01-10 NOTE — Progress Notes (Signed)
HPI: Melissa Clements is a 74 y.o. female who presents is referred by her PCP for evaluation of recurrent epistaxis.  Patient has recently been started on Plavix and Xarelto over the past 3 months.  She has been having frequent nosebleeds over the past 2 months.  The last bad nosebleed was a little over week ago from the left side..  Past Medical History:  Diagnosis Date  . Arthritis    end stage right hip  . Basal cell carcinoma   . Benign essential HTN 01/23/2015  . CAD S/P percutaneous coronary angioplasty 11/06/2019   LAD PCI with DES 11/02/2019  . Chronic kidney disease    stage III, related to high blood pressure medication  . Colon polyp   . CRI (chronic renal insufficiency), stage 3 (moderate) 11/06/2019   No ACE or ARB  . Depression   . Dizziness 01/23/2015  . Dyslipidemia, goal LDL below 70 11/06/2019   LDL 84- changed from Zocor to Lipitor 80 mg  . Dyspnea on exertion 11/01/2019  . Hematoma 11/06/2019   Hematoma and ecchymosis Rt radial site post PCI-(anticoagulation for new AF held).   . History of chest pain   . History of dizziness   . History of palpitations   . Hyperlipidemia   . Hypertension   . Ischemic cardiomyopathy 11/06/2019   EF 45%  . NSTEMI (non-ST elevated myocardial infarction) ([place]) 11/01/2019   S/P NSTEMI 11/03/2019  . Obese 01/25/2018  . Palpitations 01/23/2015  . Persistent atrial fibrillation ([place])    New diagnosis 11/03/2019- not anticoagulated yet- minimal symptoms- rate controlled  . Precordial chest pain 01/23/2015   Negative stress test in the spring 2019  . S/P right THA, AA 01/24/2018  . Sinus bradycardia 01/20/2018   Past Surgical History:  Procedure Laterality Date  . CARDIAC CATHETERIZATION    . CATARACT EXTRACTION, BILATERAL    . COLONOSCOPY    . CORONARY STENT INTERVENTION N/A 11/02/2019   Procedure: CORONARY STENT INTERVENTION;  Surgeon: Leonie Man, MD;  Location: [facility];  Service: Cardiovascular;  Laterality: N/A;  . DIAGNOSTIC  LAPAROSCOPY    . DILATION AND CURETTAGE OF UTERUS    . LEFT HEART CATH AND CORONARY ANGIOGRAPHY N/A 11/02/2019   Procedure: LEFT HEART CATH AND CORONARY ANGIOGRAPHY;  Surgeon: Leonie Man, MD;  Location: [facility];  Service: Cardiovascular;  Laterality: N/A;  . TOTAL HIP ARTHROPLASTY Right 01/24/2018   Procedure: RIGHT TOTAL HIP ARTHROPLASTY ANTERIOR APPROACH;  Surgeon: Paralee Cancel, MD;  Location: [facility];  Service: Orthopedics;  Laterality: Right;  70 mins   Social History   Socioeconomic History  . Marital status: Married    Spouse name: [name]  . Number of children: 1  . Years of education: college  . Highest education level: Associate degree: academic program  Occupational History  . Occupation: retired  Tobacco Use  . Smoking status: Never Smoker  . Smokeless tobacco: Never Used  Substance and Sexual Activity  . Alcohol use: No    Alcohol/week: 0.0 standard drinks  . Drug use: No  . Sexual activity: Not on file  Other Topics Concern  . Not on file  Social History Narrative  . Not on file   Social Determinants of Health   Financial Resource Strain:   . Difficulty of Paying Living Expenses:   Food Insecurity:   . Worried About Charity fundraiser in the Last Year:   . [place] in the Last  Year:   Transportation Needs:   . Film/video editor (Medical):   Marland Kitchen Lack of Transportation (Non-Medical):   Physical Activity:   . Days of Exercise per Week:   . Minutes of Exercise per Session:   Stress:   . Feeling of Stress :   Social Connections:   . Frequency of Communication with Friends and Family:   . Frequency of Social Gatherings with Friends and Family:   . Attends Religious Services:   . Active Member of Clubs or Organizations:   . Attends Archivist Meetings:   Marland Kitchen Marital Status:    Family History  Problem Relation Age of Onset  . Heart attack Father 31  . Heart disease Father   . Diabetes Mother   . Alzheimer's disease Mother    . Alzheimer's disease Brother    No Known Allergies Prior to Admission medications   Medication Sig Start Date End Date Taking? Authorizing Provider  atorvastatin (LIPITOR) 80 MG tablet Take 1 tablet (80 mg total) by mouth daily at 6 PM. 11/03/19  Yes Duke, Tami Lin, PA  Cholecalciferol (VITAMIN D) 2000 units tablet Take 2,000 Units by mouth every other day.    Yes [provider]  clopidogrel (PLAVIX) 75 MG tablet Take 1 tablet (75 mg total) by mouth daily with breakfast. 11/04/19  Yes Duke, Tami Lin, PA  Collagen-Boron-Hyaluronic Acid ([employer] JOINT HEALTH TRIPLE ACTION PO) Take 1 capsule by mouth daily.   Yes [provider]  Collagen-Boron-Hyaluronic Acid (MOVE FREE ULTRA JOINT HEALTH PO) Take 1,500 mg by mouth every morning.   Yes [provider]  docusate sodium (COLACE) 100 MG capsule Take 100 mg by mouth daily.   Yes [provider]  metoprolol succinate (TOPROL-XL) 50 MG 24 hr tablet Take 50 mg by mouth daily. 12/11/19  Yes [provider]  nitroGLYCERIN (NITROSTAT) 0.4 MG SL tablet Place 1 tablet (0.4 mg total) under the tongue every 5 (five) minutes x 3 doses as needed for chest pain. 11/03/19  Yes Duke, Tami Lin, PA  rivaroxaban (XARELTO) 20 MG TABS tablet Take 1 tablet (20 mg total) by mouth daily with supper. 11/16/19  Yes Park Liter, MD     Positive ROS: Otherwise negative  All other systems have been reviewed and were otherwise negative with the exception of those mentioned in the HPI and as above.  Physical Exam: Constitutional: Alert, well-appearing, no acute distress Ears: External ears without lesions or tenderness. Ear canals are clear bilaterally with intact, clear TMs.  Nasal: External nose without lesions.  Right nasal passageway is mostly clear with minimal crust on the right side of the septum which was removed with no bleeding.  She had a large amount of crusting and scabbing on the left side of the septum  that was removed with mild bleeding.  This was cauterized using silver nitrate on the left side of the septum.  Nasal passages were otherwise clear. Oral: Lips and gums without lesions. Tongue and palate mucosa without lesions. Posterior oropharynx clear. Neck: No palpable adenopathy or masses Respiratory: Breathing comfortably  Skin: No facial/neck lesions or rash noted.  Control of epistaxis  Date/Time: 01/10/2020 5:08 PM Performed by: Rozetta Nunnery, MD Authorized by: Rozetta Nunnery, MD   Consent:    Consent obtained:  Verbal   Consent given by:  Patient   Risks discussed:  Bleeding and pain   Alternatives discussed:  No treatment and observation Procedure details:    Treatment site:  L anterior   Treatment method:  Silver nitrate   Treatment complexity:  Limited   Treatment episode: initial   Post-procedure details:    Patient tolerance of procedure:  Tolerated well, no immediate complications Comments:     The left anterior septum was cauterized using silver nitrate.    Assessment: Recurrent left-sided epistaxis on patient on Plavix and Xarelto.  Plan: The bleeding site on the left side a septum was cauterized today using silver nitrate. Reviewed with her concerning using a cotton ball and Afrin to control bleeding if she has any bad nosebleeds. She will follow-up as needed.   Radene Journey, MD   CC:

## 2020-01-11 ENCOUNTER — Telehealth: Payer: Self-pay

## 2020-01-11 MED ORDER — METOPROLOL SUCCINATE ER 50 MG PO TB24: 75.0000 mg | ORAL_TABLET | Freq: Every day | ORAL | 1 refills | Status: DC

## 2020-01-11 NOTE — Telephone Encounter (Signed)
Called cardiac rehab 2x more with no answer.

## 2020-01-11 NOTE — Telephone Encounter (Signed)
Spoke with patient regarding results and recommendation.  Patient verbalizes understanding and is agreeable to plan of care. Advised patient to call back with any issues or concerns.  

## 2020-01-11 NOTE — Telephone Encounter (Signed)
Cardiac rehab was informed earlier to stop rehab for now. After disusing with patient and Dr. Agustin Cree I have called back to inform them to patient needs to continue cardiac rehab, there was no answer and no option to leave voicemail will continue efforts.

## 2020-01-11 NOTE — Telephone Encounter (Signed)
Called cardiac rehab again and spoke to [facility]. Informed her that patient will need to continue cardiac rehab as she was she verbally understood and said she will let Verdis Frederickson know.

## 2020-01-11 NOTE — Telephone Encounter (Signed)
Called patient informed her per Dr. Agustin Cree to stop cardiac rehab for now and increase metoprolol succiante to 75 mg daily. Patient doesn't agree with this plan to not continue cardiac rehab she states that cardiac rehab doesn't understand she has atrial fibrillation and it is okay for her heart rate to be 130. She agrees to increase metoprolol succinate but wants to continue cardiac rehab.   She was also informed of her urine results and that if her hematuria continued she should be referred to urology. She declined for the referral to be placed at this time, states she will let us know if things continue and she wants the referral.   Dr. Agustin Cree informed and he advised she can continue cardiac rehab with the increase in metoprolol. Called patient and informed her of this.   During the call she shared her frustration with having to call [facility] office first to get through to this office. She also complained about the turn around time for her concerns that she called with on Monday. She would like to discuss this further with administration. Lengby her information and asked her to reach out to patient.

## 2020-01-11 NOTE — Telephone Encounter (Signed)
-----   Message from Park Liter, MD sent at 01/10/2020  9:34 PM EDT ----- Blood count is stable, however she does have some traces of blood in the urine.  If it does not go away she probably need to see urologist

## 2020-01-14 ENCOUNTER — Encounter (HOSPITAL_COMMUNITY)
Admission: RE | Admit: 2020-01-14 | Discharge: 2020-01-14 | Disposition: A | Discharge status: Home / Self Care | Payer: Medicare Other | Source: Ambulatory Visit | Attending: Cardiology | Admitting: Cardiology

## 2020-01-14 ENCOUNTER — Other Ambulatory Visit: Payer: Self-pay

## 2020-01-14 DIAGNOSIS — I214 Non-ST elevation (NSTEMI) myocardial infarction: Secondary | ICD-10-CM | POA: Diagnosis present

## 2020-01-14 DIAGNOSIS — Z955 Presence of coronary angioplasty implant and graft: Secondary | ICD-10-CM | POA: Diagnosis present

## 2020-01-14 NOTE — Progress Notes (Signed)
Daily Session Note  Patient Details  Name: Melissa Clements MRN: [id number] Date of Birth: 10/03/1946 Referring Provider:     CARDIAC REHAB PHASE II ORIENTATION from 01/10/2020 in [facility]  Referring Provider  Park Liter MD      Encounter Date: 01/14/2020  Check In:   Capillary Blood Glucose: No results found for this or any previous visit (from the past 24 hour(s)).  Exercise Prescription Changes - 01/15/20 0900      Response to Exercise   Blood Pressure (Admit)  118/88    Blood Pressure (Exercise)  124/72    Blood Pressure (Exit)  114/72    Heart Rate (Admit)  100 bpm    Heart Rate (Exercise)  99 bpm    Heart Rate (Exit)  91 bpm    Rating of Perceived Exertion (Exercise)  13    Duration  Continue with 30 min of aerobic exercise without signs/symptoms of physical distress.    Intensity  THRR unchanged      Progression   Progression  Continue to progress workloads to maintain intensity without signs/symptoms of physical distress.      Resistance Training   Training Prescription  Yes    Weight  2 lbs    Reps  10-15      NuStep   Level  2    SPM  85    Minutes  15    METs  1.7      Arm Ergometer   Level  15    Minutes  15       Social History   Tobacco Use  Smoking Status Never Smoker  Smokeless Tobacco Never Used    Goals Met:  No report of cardiac concerns or symptoms Strength training completed today  Goals Unmet:  Not Applicable  Comments: Pt started cardiac rehab today.  Pt tolerated light exercise without difficulty. VSS, telemetry-Atrial Fib, asymptomatic.  Medication list reconciled. Pt denies barriers to medicaiton compliance.  PSYCHOSOCIAL ASSESSMENT:  PHQ-1. Pt exhibits positive coping skills, hopeful outlook with supportive family. Leighton does have some stressors at home. Will offer emotional support as needed. Pt enjoys spending time with her friends and her dog.   Pt oriented to exercise equipment and  routine.    Understanding verbalized.Barnet Pall, RN,BSN 01/15/2020 11:45 AM   Dr. Fransico Him is Medical Director for Cardiac Rehab at [facility].

## 2020-01-16 ENCOUNTER — Other Ambulatory Visit: Payer: Self-pay

## 2020-01-16 ENCOUNTER — Encounter (HOSPITAL_COMMUNITY)
Admission: RE | Admit: 2020-01-16 | Discharge: 2020-01-16 | Disposition: A | Discharge status: Home / Self Care | Payer: Medicare Other | Source: Ambulatory Visit | Attending: Cardiology | Admitting: Cardiology

## 2020-01-16 DIAGNOSIS — Z955 Presence of coronary angioplasty implant and graft: Secondary | ICD-10-CM

## 2020-01-16 DIAGNOSIS — I214 Non-ST elevation (NSTEMI) myocardial infarction: Secondary | ICD-10-CM | POA: Diagnosis not present

## 2020-01-16 NOTE — Progress Notes (Signed)
Melissa Clements 74 y.o. female Nutrition Note  Visit Diagnosis: NSTEMI (non-ST elevated myocardial infarction) ([place]) 11/01/19  Past Medical History:  Diagnosis Date  . Arthritis    end stage right hip  . Basal cell carcinoma   . Benign essential HTN 01/23/2015  . CAD S/P percutaneous coronary angioplasty 11/06/2019   LAD PCI with DES 11/02/2019  . Chronic kidney disease    stage III, related to high blood pressure medication  . Colon polyp   . CRI (chronic renal insufficiency), stage 3 (moderate) 11/06/2019   No ACE or ARB  . Depression   . Dizziness 01/23/2015  . Dyslipidemia, goal LDL below 70 11/06/2019   LDL 84- changed from Zocor to Lipitor 80 mg  . Dyspnea on exertion 11/01/2019  . Hematoma 11/06/2019   Hematoma and ecchymosis Rt radial site post PCI-(anticoagulation for new AF held).   . History of chest pain   . History of dizziness   . History of palpitations   . Hyperlipidemia   . Hypertension   . Ischemic cardiomyopathy 11/06/2019   EF 45%  . NSTEMI (non-ST elevated myocardial infarction) ([place]) 11/01/2019   S/P NSTEMI 11/03/2019  . Obese 01/25/2018  . Palpitations 01/23/2015  . Persistent atrial fibrillation ([place])    New diagnosis 11/03/2019- not anticoagulated yet- minimal symptoms- rate controlled  . Precordial chest pain 01/23/2015   Negative stress test in the spring 2019  . S/P right THA, AA 01/24/2018  . Sinus bradycardia 01/20/2018     Medications reviewed.   Current Outpatient Medications:  .  atorvastatin (LIPITOR) 80 MG tablet, Take 1 tablet (80 mg total) by mouth daily at 6 PM., Disp: 90 tablet, Rfl: 3 .  Cholecalciferol (VITAMIN D) 2000 units tablet, Take 2,000 Units by mouth every other day. , Disp: , Rfl:  .  clopidogrel (PLAVIX) 75 MG tablet, Take 1 tablet (75 mg total) by mouth daily with breakfast., Disp: 90 tablet, Rfl: 3 .  Collagen-Boron-Hyaluronic Acid ([employer] JOINT HEALTH TRIPLE ACTION PO), Take 1 capsule by mouth daily., Disp: , Rfl:  .   Collagen-Boron-Hyaluronic Acid (MOVE FREE ULTRA JOINT HEALTH PO), Take 1,500 mg by mouth every morning., Disp: , Rfl:  .  docusate sodium (COLACE) 100 MG capsule, Take 100 mg by mouth daily., Disp: , Rfl:  .  metoprolol succinate (TOPROL-XL) 50 MG 24 hr tablet, Take 1.5 tablets (75 mg total) by mouth daily., Disp: 90 tablet, Rfl: 1 .  nitroGLYCERIN (NITROSTAT) 0.4 MG SL tablet, Place 1 tablet (0.4 mg total) under the tongue every 5 (five) minutes x 3 doses as needed for chest pain., Disp: 25 tablet, Rfl: 3 .  rivaroxaban (XARELTO) 20 MG TABS tablet, Take 1 tablet (20 mg total) by mouth daily with supper., Disp: 30 tablet, Rfl: 12   Ht Readings from Last 1 Encounters:  01/10/20 5' 3.25" (1.607 m)     Wt Readings from Last 3 Encounters:  01/10/20 232 lb 9.4 oz (105.5 kg)  12/24/19 234 lb 12.8 oz (106.5 kg)  11/19/19 240 lb (108.9 kg)     There is no height or weight on file to calculate BMI.   Social History   Tobacco Use  Smoking Status Never Smoker  Smokeless Tobacco Never Used     Lab Results  Component Value Date   CHOL 132 12/24/2019   Lab Results  Component Value Date   HDL 57 12/24/2019   Lab Results  Component Value Date   LDLCALC 57 12/24/2019   Lab  Results  Component Value Date   TRIG 97 12/24/2019     Lab Results  Component Value Date   HGBA1C 5.5 11/01/2019     CBG (last 3)  No results for input(s): GLUCAP in the last 72 hours.   Nutrition Note  Spoke with pt. Nutrition Plan and Nutrition Survey goals reviewed with pt. Pt is following a Heart Healthy diet. Pt wants to lose wt. Pt has been trying to lose wt by decreasing fried foods, choosing whole grains, and eating more fruits/veggies. She reveals a long history of dieting with resulting weight cycling. She says she used to be more active prior to covid. Goal setting around increasing activity. She came up with walking with friends after lunch to decrease sedentary time. She only eats out about  2x/week.  She reports normal depletion of appetite with age. She eats 2 small meals each day. Based on diet recall, she does not eat excessive calories. She does reveal sitting most of the day. She thinks exercise is her most modifiable risk factor.  She reports high stress at home but with supportive husband relationship. We discussed coping strategies. She reveals history of food as coping strategy. Since MI, she has tried to create alternative strategies with success. She reports losing 20 lbs on purpose with careful diet changes listed above.  Pt expressed understanding of the information reviewed.   Nutrition Diagnosis ? Obese  III = 40+ related to excessive energy intake as evidenced by a 40.88kg/m2  Nutrition Intervention ? Pt's individual nutrition plan reviewed with pt. ? Benefits of adopting Heart Healthy diet discussed when Medficts reviewed.   ? Continue client-centered nutrition education by RD, as part of interdisciplinary care.  Goal(s) ? Pt to identify food quantities necessary to achieve weight loss of 6-24 lb at graduation from cardiac rehab.  ? Pt to build a healthy plate including vegetables, fruits, whole grains, and low-fat dairy products in a heart healthy meal plan.  Plan:    Will provide client-centered nutrition education as part of interdisciplinary care  Monitor and evaluate progress toward nutrition goal with team.   Michaele Offer, MS, RDN, LDN

## 2020-01-18 ENCOUNTER — Encounter (HOSPITAL_COMMUNITY)
Admission: RE | Admit: 2020-01-18 | Discharge: 2020-01-18 | Disposition: A | Discharge status: Home / Self Care | Payer: Medicare Other | Source: Ambulatory Visit | Attending: Cardiology | Admitting: Cardiology

## 2020-01-18 ENCOUNTER — Other Ambulatory Visit: Payer: Self-pay

## 2020-01-18 DIAGNOSIS — I214 Non-ST elevation (NSTEMI) myocardial infarction: Secondary | ICD-10-CM

## 2020-01-18 DIAGNOSIS — Z955 Presence of coronary angioplasty implant and graft: Secondary | ICD-10-CM

## 2020-01-18 NOTE — Progress Notes (Signed)
QUALITY OF LIFE SCORE REVIEW  Melissa Clements completed Quality of Life survey as a participant in Cardiac Rehab. Scores 21.0 or below are considered low. Pt score very low in several areas Overall 17.34, Health and Function 16.54, socioeconomic 21.17, physiological and spiritual 17.50, family 14.80. Patient quality of life slightly altered by physical constraints which limits ability to perform as prior to recent cardiac illness. Melissa Clements shared that she has experienced some depression due to her recent cardiac events and family issues.  Offered emotional support and reassurance.  Will continue to monitor and intervene as necessary.  Melissa Clements says that she has received counseling  in the past and is not interested in receiving  Any further services at this time. Will notify [facility]'s PCP and forward survey. Barnet Pall, RN,BSN 01/18/2020 10:40 AM

## 2020-01-21 ENCOUNTER — Other Ambulatory Visit: Payer: Self-pay

## 2020-01-21 ENCOUNTER — Encounter (HOSPITAL_COMMUNITY)
Admission: RE | Admit: 2020-01-21 | Discharge: 2020-01-21 | Disposition: A | Discharge status: Home / Self Care | Payer: Medicare Other | Source: Ambulatory Visit | Attending: Cardiology | Admitting: Cardiology

## 2020-01-21 DIAGNOSIS — I214 Non-ST elevation (NSTEMI) myocardial infarction: Secondary | ICD-10-CM | POA: Diagnosis not present

## 2020-01-21 DIAGNOSIS — Z955 Presence of coronary angioplasty implant and graft: Secondary | ICD-10-CM

## 2020-01-23 ENCOUNTER — Encounter (HOSPITAL_COMMUNITY): Payer: Medicare Other

## 2020-01-23 ENCOUNTER — Other Ambulatory Visit: Payer: Self-pay

## 2020-01-23 ENCOUNTER — Encounter (HOSPITAL_COMMUNITY)
Admission: RE | Admit: 2020-01-23 | Discharge: 2020-01-23 | Disposition: A | Discharge status: Home / Self Care | Payer: Medicare Other | Source: Ambulatory Visit | Attending: Cardiology | Admitting: Cardiology

## 2020-01-23 DIAGNOSIS — Z955 Presence of coronary angioplasty implant and graft: Secondary | ICD-10-CM

## 2020-01-23 DIAGNOSIS — I214 Non-ST elevation (NSTEMI) myocardial infarction: Secondary | ICD-10-CM

## 2020-01-25 ENCOUNTER — Other Ambulatory Visit: Payer: Self-pay

## 2020-01-25 ENCOUNTER — Encounter (HOSPITAL_COMMUNITY)
Admission: RE | Admit: 2020-01-25 | Discharge: 2020-01-25 | Disposition: A | Discharge status: Home / Self Care | Payer: Medicare Other | Source: Ambulatory Visit | Attending: Cardiology | Admitting: Cardiology

## 2020-01-25 ENCOUNTER — Ambulatory Visit: Payer: Medicare Other | Admitting: Cardiology

## 2020-01-25 ENCOUNTER — Encounter: Payer: Self-pay | Admitting: Cardiology

## 2020-01-25 VITALS — BP 132/90 | HR 72 | Ht 63.25 in | Wt 233.0 lb

## 2020-01-25 DIAGNOSIS — I2510 Atherosclerotic heart disease of native coronary artery without angina pectoris: Secondary | ICD-10-CM | POA: Diagnosis not present

## 2020-01-25 DIAGNOSIS — I214 Non-ST elevation (NSTEMI) myocardial infarction: Secondary | ICD-10-CM | POA: Diagnosis not present

## 2020-01-25 DIAGNOSIS — E785 Hyperlipidemia, unspecified: Secondary | ICD-10-CM

## 2020-01-25 DIAGNOSIS — I10 Essential (primary) hypertension: Secondary | ICD-10-CM | POA: Diagnosis not present

## 2020-01-25 DIAGNOSIS — R001 Bradycardia, unspecified: Secondary | ICD-10-CM | POA: Diagnosis not present

## 2020-01-25 DIAGNOSIS — I4819 Other persistent atrial fibrillation: Secondary | ICD-10-CM | POA: Diagnosis not present

## 2020-01-25 DIAGNOSIS — Z9861 Coronary angioplasty status: Secondary | ICD-10-CM

## 2020-01-25 DIAGNOSIS — Z955 Presence of coronary angioplasty implant and graft: Secondary | ICD-10-CM

## 2020-01-25 MED ORDER — METOPROLOL SUCCINATE ER 50 MG PO TB24: 75.0000 mg | ORAL_TABLET | Freq: Every day | ORAL | 1 refills | Status: DC

## 2020-01-25 MED ORDER — LISINOPRIL 2.5 MG PO TABS: 2.5000 mg | ORAL_TABLET | Freq: Every day | ORAL | 3 refills | Status: DC

## 2020-01-25 NOTE — Addendum Note (Signed)
Addended by: Tamsen Snider on: 01/25/2020 02:40 PM   Modules accepted: Orders

## 2020-01-25 NOTE — Progress Notes (Signed)
Cardiology Office Note:    Date:  01/25/2020   ID:  GIDGETT TORMAN, DOB 08-Jul-1946, MRN [id number]  PCP:  [name], MD  Cardiologist:  Jenne Campus, MD    Referring MD: [name], MD   Chief Complaint  Patient presents with  . Follow-up  I am doing great  History of Present Illness:    Melissa Clements is a 74 y.o. female  with past medical history significant for coronary artery disease status post PTCA and drug-eluting stents to LAD done in November 02, 2019 in face of late presentation of myocardial infarction, also history of essential hypertension, dyslipidemia, atrial fibrillation.   She comes today to my office to talk about her atrial fibrillation.  Luckily overall she is doing well.  She is participating in rehab and this is her second week.  Denies have any chest pain tightness squeezing pressure burning chest no palpitations no dizziness no swelling of lower extremities.  She is enjoying her rehab.  Past Medical History:  Diagnosis Date  . Arthritis    end stage right hip  . Basal cell carcinoma   . Benign essential HTN 01/23/2015  . CAD S/P percutaneous coronary angioplasty 11/06/2019   LAD PCI with DES 11/02/2019  . Chronic kidney disease    stage III, related to high blood pressure medication  . Colon polyp   . CRI (chronic renal insufficiency), stage 3 (moderate) 11/06/2019   No ACE or ARB  . Depression   . Dizziness 01/23/2015  . Dyslipidemia, goal LDL below 70 11/06/2019   LDL 84- changed from Zocor to Lipitor 80 mg  . Dyspnea on exertion 11/01/2019  . Hematoma 11/06/2019   Hematoma and ecchymosis Rt radial site post PCI-(anticoagulation for new AF held).   . History of chest pain   . History of dizziness   . History of palpitations   . Hyperlipidemia   . Hypertension   . Ischemic cardiomyopathy 11/06/2019   EF 45%  . NSTEMI (non-ST elevated myocardial infarction) ([place]) 11/01/2019   S/P NSTEMI 11/03/2019  . Obese 01/25/2018  . Palpitations 01/23/2015  .  Persistent atrial fibrillation ([place])    New diagnosis 11/03/2019- not anticoagulated yet- minimal symptoms- rate controlled  . Precordial chest pain 01/23/2015   Negative stress test in the spring 2019  . S/P right THA, AA 01/24/2018  . Sinus bradycardia 01/20/2018    Past Surgical History:  Procedure Laterality Date  . CARDIAC CATHETERIZATION    . CATARACT EXTRACTION, BILATERAL    . COLONOSCOPY    . CORONARY STENT INTERVENTION N/A 11/02/2019   Procedure: CORONARY STENT INTERVENTION;  Surgeon: Leonie Man, MD;  Location: [facility];  Service: Cardiovascular;  Laterality: N/A;  . DIAGNOSTIC LAPAROSCOPY    . DILATION AND CURETTAGE OF UTERUS    . LEFT HEART CATH AND CORONARY ANGIOGRAPHY N/A 11/02/2019   Procedure: LEFT HEART CATH AND CORONARY ANGIOGRAPHY;  Surgeon: Leonie Man, MD;  Location: [facility];  Service: Cardiovascular;  Laterality: N/A;  . TOTAL HIP ARTHROPLASTY Right 01/24/2018   Procedure: RIGHT TOTAL HIP ARTHROPLASTY ANTERIOR APPROACH;  Surgeon: Paralee Cancel, MD;  Location: [facility];  Service: Orthopedics;  Laterality: Right;  70 mins    Current Medications: Current Meds  Medication Sig  . atorvastatin (LIPITOR) 80 MG tablet Take 1 tablet (80 mg total) by mouth daily at 6 PM.  . Cholecalciferol (VITAMIN D) 2000 units tablet Take 2,000 Units by mouth every other day.   Marland Kitchen  clopidogrel (PLAVIX) 75 MG tablet Take 1 tablet (75 mg total) by mouth daily with breakfast.  . Collagen-Boron-Hyaluronic Acid ([employer] JOINT HEALTH TRIPLE ACTION PO) Take 1 capsule by mouth daily.  . Collagen-Boron-Hyaluronic Acid (MOVE FREE ULTRA JOINT HEALTH PO) Take 1,500 mg by mouth every morning.  . docusate sodium (COLACE) 100 MG capsule Take 100 mg by mouth daily.  . metoprolol succinate (TOPROL-XL) 50 MG 24 hr tablet Take 1.5 tablets (75 mg total) by mouth daily.  . nitroGLYCERIN (NITROSTAT) 0.4 MG SL tablet Place 1 tablet (0.4 mg total) under the tongue every 5 (five) minutes x 3  doses as needed for chest pain.  . rivaroxaban (XARELTO) 20 MG TABS tablet Take 1 tablet (20 mg total) by mouth daily with supper.     Allergies:   Patient has no known allergies.   Social History   Socioeconomic History  . Marital status: Married    Spouse name: [name]  . Number of children: 1  . Years of education: college  . Highest education level: Associate degree: academic program  Occupational History  . Occupation: retired  Tobacco Use  . Smoking status: Never Smoker  . Smokeless tobacco: Never Used  Substance and Sexual Activity  . Alcohol use: No    Alcohol/week: 0.0 standard drinks  . Drug use: No  . Sexual activity: Not on file  Other Topics Concern  . Not on file  Social History Narrative  . Not on file   Social Determinants of Health   Financial Resource Strain:   . Difficulty of Paying Living Expenses:   Food Insecurity:   . Worried About Charity fundraiser in the Last Year:   . Arboriculturist in the Last Year:   Transportation Needs:   . Film/video editor (Medical):   Marland Kitchen Lack of Transportation (Non-Medical):   Physical Activity:   . Days of Exercise per Week:   . Minutes of Exercise per Session:   Stress:   . Feeling of Stress :   Social Connections:   . Frequency of Communication with Friends and Family:   . Frequency of Social Gatherings with Friends and Family:   . Attends Religious Services:   . Active Member of Clubs or Organizations:   . Attends Archivist Meetings:   Marland Kitchen Marital Status:      Family History: The patient's family history includes Alzheimer's disease in her brother and mother; Diabetes in her mother; Heart attack (age of onset: 38) in her father; Heart disease in her father. ROS:   Please see the history of present illness.    All 14 point review of systems negative except as described per history of present illness  EKGs/Labs/Other Studies Reviewed:    Echocardiogram from December 27, 2019 showed:  1. Left  ventricular ejection fraction, by estimation, is 55 to 60%. The  left ventricle has normal function. The left ventricle has no regional  wall motion abnormalities. There is mild left ventricular hypertrophy.  Left ventricular diastolic parameters  are indeterminate.  2. Right ventricular systolic function is normal. The right ventricular  size is normal. There is normal pulmonary artery systolic pressure.  3. The mitral valve is normal in structure. Mild mitral valve  regurgitation. No evidence of mitral stenosis.  4. The aortic valve is normal in structure. Aortic valve regurgitation is  not visualized. No aortic stenosis is present.  5. The inferior vena cava is normal in size with greater than 50%  respiratory  variability, suggesting right atrial pressure of 3 mmHg.   Cardiac catheterization from November 02, 2019 showed:  Prox LAD to Mid LAD lesion is 90% stenosed eccentric, focal calcification).  A drug-eluting stent was successfully placed using a SYNERGY XD 3.0X20. Postdilated to 3.6 mm  Post intervention, there is a 10% focal residual stenosis-waist, with the remainder being fully expanded.Marland Kitchen  ------------------------  LV end diastolic pressure is normal.  There is no aortic valve stenosis.  Recent Labs: 11/01/2019: B Natriuretic Peptide 410.5; Magnesium 2.1; TSH 3.181 11/03/2019: BUN 14; Creatinine, Ser 1.00; Potassium 4.2; Sodium 139 01/08/2020: Hemoglobin 14.7; Platelets 185  Recent Lipid Panel    Component Value Date/Time   CHOL 132 12/24/2019 1642   TRIG 97 12/24/2019 1642   HDL 57 12/24/2019 1642   CHOLHDL 2.3 12/24/2019 1642   CHOLHDL 2.6 11/02/2019 0344   VLDL 11 11/02/2019 0344   LDLCALC 57 12/24/2019 1642    Physical Exam:    VS:  BP 132/90   Pulse 72   Ht 5' 3.25" (1.607 m)   Wt 233 lb (105.7 kg)   SpO2 97%   BMI 40.95 kg/m     Wt Readings from Last 3 Encounters:  01/25/20 233 lb (105.7 kg)  01/10/20 232 lb 9.4 oz (105.5 kg)  12/24/19 234 lb  12.8 oz (106.5 kg)     GEN:  Well nourished, well developed in no acute distress HEENT: Normal NECK: No JVD; No carotid bruits LYMPHATICS: No lymphadenopathy CARDIAC: Irregularly irregular, no murmurs, no rubs, no gallops RESPIRATORY:  Clear to auscultation without rales, wheezing or rhonchi  ABDOMEN: Soft, non-tender, non-distended MUSCULOSKELETAL:  No edema; No deformity  SKIN: Warm and dry LOWER EXTREMITIES: no swelling NEUROLOGIC:  Alert and oriented x 3 PSYCHIATRIC:  Normal affect   ASSESSMENT:    1. CAD S/P percutaneous coronary angioplasty   2. Persistent atrial fibrillation ([place])   3. Benign essential HTN   4. Sinus bradycardia   5. Dyslipidemia, goal LDL below 70    PLAN:    In order of problems listed above:  1. Coronary disease status post PCI and stenting of left anterior descending artery in face of late presentation of myocardial infarction.  She is on dual therapy right now.  She is on Xarelto as well as Plavix.  Tolerating well.  She did have some issue with nosebleed but does been taking care of.  Overall she is asymptomatic. 2. Ischemic cardiomyopathy: Last echocardiogram showed preserved left ventricle ejection fraction.  She is on beta-blocker which I will continue.  We will add lisinopril 2.5 daily.  Chem-7 will be checked after that. Benign essential hypertension blood pressure seems to be well controlled continue present management. Dyslipidemia: She is on high intensity statin which I will continue.  In the future we will do fasting lipid profile.  Her goal for LDL is less than 70.  This is very high risk for coronary event.  Overall she is doing well.  Hopefully will be able to bring her back to normal rhythm.  I explained cardioversion to her including all risk benefits as well as alternatives.   Medication Adjustments/Labs and Tests Ordered: Current medicines are reviewed at length with the patient today.  Concerns regarding medicines are outlined  above.  No orders of the defined types were placed in this encounter.  Medication changes: No orders of the defined types were placed in this encounter.   Signed, Park Liter, MD, [facility] 01/25/2020 2:18 PM    [facility]

## 2020-01-25 NOTE — Patient Instructions (Signed)
Medication Instructions:  Your physician has recommended you make the following change in your medication: 1. Start Lisinopril one tablet ( 2.5 mg) daily, sent into pharmacy.    Labwork: Your physician recommends that you havae lab work today: bmet/cbc   Testing/Procedures:  Follow-Up: Your physician recommends that you keep your scheduled follow-up appointment  With Dr. Fraser Din in June.    Any Other Special Instructions Will Be Listed Below (If Applicable).  Your provider has recommended a cardioversion.   COVID SCREENING INFORMATION: You are scheduled for your COVID screening on Saturday, April 24 at 12:00 pm. [facility] (old [facility]) [address] Stay in the RIGHT lane and proceed under the brick awning (NOT the tent) and tell them you are there for pre-procedure testing Do NOT bring any pets with you to the testing site   You are scheduled for a cardioversion on Tuesday, April 27 at 9:00 am  with Dr. Oval Linsey or associates. Please go to [facility] ([address] 2nd [place] Stay at 7:30 am.  There is free valet parking available.  Enter through the [place] not have any food or drink after midnight on Monday, April 26.  You may take your medicines with a sip of water on the day of your procedure.   Hold the following medicines None  DO NOT STOP YOUR ANTICOAGULANT (BLOOD THINNER) Xarelto - YOU WILL NEED TO CONTINUE YOUR ANTICOAGULANT AFTER YOUR PROCEDURE UNTIL YOU ARE TOLD BY YOUR PROVIDER IT IS SAFE TO STOP  You will need someone to drive you home following your procedure and stay in the waiting room during your procedure. Failure to do so could result in your procedure being cancelled.   Every effort is made to have your procedure done on time. Occasionally there are emergencies that occur at the hospital that may cause delays.   Call the [place] office at [phone] if you  have any questions, problems or concerns.     Electrical Cardioversion Electrical cardioversion is the delivery of a jolt of electricity to change the rhythm of the heart. Sticky patches or metal paddles are placed on the chest to deliver the electricity from a device. This is done to restore a normal rhythm. A rhythm that is too fast or not regular keeps the heart from pumping well. Electrical cardioversion is done in an emergency if:   There is low or no blood pressure as a result of the heart rhythm.    Normal rhythm must be restored as fast as possible to protect the brain and heart from further damage.    It may save a life. Cardioversion may be done for heart rhythms that are not immediately life threatening, such as atrial fibrillation or flutter, in which:   The heart is beating too fast or is not regular.    Medicine to change the rhythm has not worked.    It is safe to wait in order to allow time for preparation.  Symptoms of the abnormal rhythm are bothersome.  The risk of stroke and other serious problems can be reduced.  LET [facility] CARE PROVIDER KNOW ABOUT:   Any allergies you have.  All medicines you are taking, including vitamins, herbs, eye drops, creams, and over-the-counter medicines.  Previous problems you or members of your family have had with the use of anesthetics.    Any blood disorders you have.    Previous surgeries  you have had.    Medical conditions you have.   RISKS AND COMPLICATIONS  Generally, this is a safe procedure. However, problems can occur and include:   Breathing problems related to the anesthetic used.  A blood clot that breaks free and travels to other parts of your body. This could cause a stroke or other problems. The risk of this is lowered by use of blood-thinning medicine (anticoagulant) prior to the procedure.  Cardiac arrest (rare).   BEFORE THE PROCEDURE   You may have tests to detect blood clots in your heart  and to evaluate heart function.   You may start taking anticoagulants so your blood does not clot as easily.    Medicines may be given to help stabilize your heart rate and rhythm.   PROCEDURE  You will be given medicine through an IV tube to reduce discomfort and make you sleepy (sedative).    An electrical shock will be delivered.   AFTER THE PROCEDURE Your heart rhythm will be watched to make sure it does not change. You will need someone to drive you home.      If you need a refill on your cardiac medications before your next appointment, please call your pharmacy.

## 2020-01-25 NOTE — H&P (View-Only) (Signed)
Cardiology Office Note:    Date:  01/25/2020   ID:  Melissa Clements, DOB Apr 08, 1946, MRN [id number]  PCP:  [name], MD  Cardiologist:  Jenne Campus, MD    Referring MD: [name], MD   Chief Complaint  Patient presents with  . Follow-up  I am doing great  History of Present Illness:    Melissa Clements is a 74 y.o. female  with past medical history significant for coronary artery disease status post PTCA and drug-eluting stents to LAD done in November 02, 2019 in face of late presentation of myocardial infarction, also history of essential hypertension, dyslipidemia, atrial fibrillation.   She comes today to my office to talk about her atrial fibrillation.  Luckily overall she is doing well.  She is participating in rehab and this is her second week.  Denies have any chest pain tightness squeezing pressure burning chest no palpitations no dizziness no swelling of lower extremities.  She is enjoying her rehab.  Past Medical History:  Diagnosis Date  . Arthritis    end stage right hip  . Basal cell carcinoma   . Benign essential HTN 01/23/2015  . CAD S/P percutaneous coronary angioplasty 11/06/2019   LAD PCI with DES 11/02/2019  . Chronic kidney disease    stage III, related to high blood pressure medication  . Colon polyp   . CRI (chronic renal insufficiency), stage 3 (moderate) 11/06/2019   No ACE or ARB  . Depression   . Dizziness 01/23/2015  . Dyslipidemia, goal LDL below 70 11/06/2019   LDL 84- changed from Zocor to Lipitor 80 mg  . Dyspnea on exertion 11/01/2019  . Hematoma 11/06/2019   Hematoma and ecchymosis Rt radial site post PCI-(anticoagulation for new AF held).   . History of chest pain   . History of dizziness   . History of palpitations   . Hyperlipidemia   . Hypertension   . Ischemic cardiomyopathy 11/06/2019   EF 45%  . NSTEMI (non-ST elevated myocardial infarction) ([place]) 11/01/2019   S/P NSTEMI 11/03/2019  . Obese 01/25/2018  . Palpitations 01/23/2015  .  Persistent atrial fibrillation ([place])    New diagnosis 11/03/2019- not anticoagulated yet- minimal symptoms- rate controlled  . Precordial chest pain 01/23/2015   Negative stress test in the spring 2019  . S/P right THA, AA 01/24/2018  . Sinus bradycardia 01/20/2018    Past Surgical History:  Procedure Laterality Date  . CARDIAC CATHETERIZATION    . CATARACT EXTRACTION, BILATERAL    . COLONOSCOPY    . CORONARY STENT INTERVENTION N/A 11/02/2019   Procedure: CORONARY STENT INTERVENTION;  Surgeon: Leonie Man, MD;  Location: [facility];  Service: Cardiovascular;  Laterality: N/A;  . DIAGNOSTIC LAPAROSCOPY    . DILATION AND CURETTAGE OF UTERUS    . LEFT HEART CATH AND CORONARY ANGIOGRAPHY N/A 11/02/2019   Procedure: LEFT HEART CATH AND CORONARY ANGIOGRAPHY;  Surgeon: Leonie Man, MD;  Location: [facility];  Service: Cardiovascular;  Laterality: N/A;  . TOTAL HIP ARTHROPLASTY Right 01/24/2018   Procedure: RIGHT TOTAL HIP ARTHROPLASTY ANTERIOR APPROACH;  Surgeon: Paralee Cancel, MD;  Location: [facility];  Service: Orthopedics;  Laterality: Right;  70 mins    Current Medications: Current Meds  Medication Sig  . atorvastatin (LIPITOR) 80 MG tablet Take 1 tablet (80 mg total) by mouth daily at 6 PM.  . Cholecalciferol (VITAMIN D) 2000 units tablet Take 2,000 Units by mouth every other day.   Marland Kitchen  clopidogrel (PLAVIX) 75 MG tablet Take 1 tablet (75 mg total) by mouth daily with breakfast.  . Collagen-Boron-Hyaluronic Acid ([employer] JOINT HEALTH TRIPLE ACTION PO) Take 1 capsule by mouth daily.  . Collagen-Boron-Hyaluronic Acid (MOVE FREE ULTRA JOINT HEALTH PO) Take 1,500 mg by mouth every morning.  . docusate sodium (COLACE) 100 MG capsule Take 100 mg by mouth daily.  . metoprolol succinate (TOPROL-XL) 50 MG 24 hr tablet Take 1.5 tablets (75 mg total) by mouth daily.  . nitroGLYCERIN (NITROSTAT) 0.4 MG SL tablet Place 1 tablet (0.4 mg total) under the tongue every 5 (five) minutes x 3  doses as needed for chest pain.  . rivaroxaban (XARELTO) 20 MG TABS tablet Take 1 tablet (20 mg total) by mouth daily with supper.     Allergies:   Patient has no known allergies.   Social History   Socioeconomic History  . Marital status: Married    Spouse name: [name]  . Number of children: 1  . Years of education: college  . Highest education level: Associate degree: academic program  Occupational History  . Occupation: retired  Tobacco Use  . Smoking status: Never Smoker  . Smokeless tobacco: Never Used  Substance and Sexual Activity  . Alcohol use: No    Alcohol/week: 0.0 standard drinks  . Drug use: No  . Sexual activity: Not on file  Other Topics Concern  . Not on file  Social History Narrative  . Not on file   Social Determinants of Health   Financial Resource Strain:   . Difficulty of Paying Living Expenses:   Food Insecurity:   . Worried About Charity fundraiser in the Last Year:   . Arboriculturist in the Last Year:   Transportation Needs:   . Film/video editor (Medical):   Marland Kitchen Lack of Transportation (Non-Medical):   Physical Activity:   . Days of Exercise per Week:   . Minutes of Exercise per Session:   Stress:   . Feeling of Stress :   Social Connections:   . Frequency of Communication with Friends and Family:   . Frequency of Social Gatherings with Friends and Family:   . Attends Religious Services:   . Active Member of Clubs or Organizations:   . Attends Archivist Meetings:   Marland Kitchen Marital Status:      Family History: The patient's family history includes Alzheimer's disease in her brother and mother; Diabetes in her mother; Heart attack (age of onset: 22) in her father; Heart disease in her father. ROS:   Please see the history of present illness.    All 14 point review of systems negative except as described per history of present illness  EKGs/Labs/Other Studies Reviewed:    Echocardiogram from December 27, 2019 showed:  1. Left  ventricular ejection fraction, by estimation, is 55 to 60%. The  left ventricle has normal function. The left ventricle has no regional  wall motion abnormalities. There is mild left ventricular hypertrophy.  Left ventricular diastolic parameters  are indeterminate.  2. Right ventricular systolic function is normal. The right ventricular  size is normal. There is normal pulmonary artery systolic pressure.  3. The mitral valve is normal in structure. Mild mitral valve  regurgitation. No evidence of mitral stenosis.  4. The aortic valve is normal in structure. Aortic valve regurgitation is  not visualized. No aortic stenosis is present.  5. The inferior vena cava is normal in size with greater than 50%  respiratory  variability, suggesting right atrial pressure of 3 mmHg.   Cardiac catheterization from November 02, 2019 showed:  Prox LAD to Mid LAD lesion is 90% stenosed eccentric, focal calcification).  A drug-eluting stent was successfully placed using a SYNERGY XD 3.0X20. Postdilated to 3.6 mm  Post intervention, there is a 10% focal residual stenosis-waist, with the remainder being fully expanded.Marland Kitchen  ------------------------  LV end diastolic pressure is normal.  There is no aortic valve stenosis.  Recent Labs: 11/01/2019: B Natriuretic Peptide 410.5; Magnesium 2.1; TSH 3.181 11/03/2019: BUN 14; Creatinine, Ser 1.00; Potassium 4.2; Sodium 139 01/08/2020: Hemoglobin 14.7; Platelets 185  Recent Lipid Panel    Component Value Date/Time   CHOL 132 12/24/2019 1642   TRIG 97 12/24/2019 1642   HDL 57 12/24/2019 1642   CHOLHDL 2.3 12/24/2019 1642   CHOLHDL 2.6 11/02/2019 0344   VLDL 11 11/02/2019 0344   LDLCALC 57 12/24/2019 1642    Physical Exam:    VS:  BP 132/90   Pulse 72   Ht 5' 3.25" (1.607 m)   Wt 233 lb (105.7 kg)   SpO2 97%   BMI 40.95 kg/m     Wt Readings from Last 3 Encounters:  01/25/20 233 lb (105.7 kg)  01/10/20 232 lb 9.4 oz (105.5 kg)  12/24/19 234 lb  12.8 oz (106.5 kg)     GEN:  Well nourished, well developed in no acute distress HEENT: Normal NECK: No JVD; No carotid bruits LYMPHATICS: No lymphadenopathy CARDIAC: Irregularly irregular, no murmurs, no rubs, no gallops RESPIRATORY:  Clear to auscultation without rales, wheezing or rhonchi  ABDOMEN: Soft, non-tender, non-distended MUSCULOSKELETAL:  No edema; No deformity  SKIN: Warm and dry LOWER EXTREMITIES: no swelling NEUROLOGIC:  Alert and oriented x 3 PSYCHIATRIC:  Normal affect   ASSESSMENT:    1. CAD S/P percutaneous coronary angioplasty   2. Persistent atrial fibrillation ([place])   3. Benign essential HTN   4. Sinus bradycardia   5. Dyslipidemia, goal LDL below 70    PLAN:    In order of problems listed above:  1. Coronary disease status post PCI and stenting of left anterior descending artery in face of late presentation of myocardial infarction.  She is on dual therapy right now.  She is on Xarelto as well as Plavix.  Tolerating well.  She did have some issue with nosebleed but does been taking care of.  Overall she is asymptomatic. 2. Ischemic cardiomyopathy: Last echocardiogram showed preserved left ventricle ejection fraction.  She is on beta-blocker which I will continue.  We will add lisinopril 2.5 daily.  Chem-7 will be checked after that. Benign essential hypertension blood pressure seems to be well controlled continue present management. Dyslipidemia: She is on high intensity statin which I will continue.  In the future we will do fasting lipid profile.  Her goal for LDL is less than 70.  This is very high risk for coronary event.  Overall she is doing well.  Hopefully will be able to bring her back to normal rhythm.  I explained cardioversion to her including all risk benefits as well as alternatives.   Medication Adjustments/Labs and Tests Ordered: Current medicines are reviewed at length with the patient today.  Concerns regarding medicines are outlined  above.  No orders of the defined types were placed in this encounter.  Medication changes: No orders of the defined types were placed in this encounter.   Signed, Park Liter, MD, [facility] 01/25/2020 2:18 PM    [facility]

## 2020-01-26 ENCOUNTER — Other Ambulatory Visit (HOSPITAL_COMMUNITY)
Admission: RE | Admit: 2020-01-26 | Discharge: 2020-01-26 | Disposition: A | Discharge status: Home / Self Care | Payer: Medicare Other | Source: Ambulatory Visit | Attending: Cardiovascular Disease | Admitting: Cardiovascular Disease

## 2020-01-26 DIAGNOSIS — Z20822 Contact with and (suspected) exposure to covid-19: Secondary | ICD-10-CM | POA: Insufficient documentation

## 2020-01-26 DIAGNOSIS — Z01812 Encounter for preprocedural laboratory examination: Secondary | ICD-10-CM | POA: Insufficient documentation

## 2020-01-26 LAB — CBC WITH DIFFERENTIAL/PLATELET
Basophils Absolute: 0 10*3/uL (ref 0.0–0.2)
Basos: 1 %
EOS (ABSOLUTE): 0.2 10*3/uL (ref 0.0–0.4)
Eos: 3 %
Hematocrit: 43.5 % (ref 34.0–46.6)
Hemoglobin: 13.9 g/dL (ref 11.1–15.9)
Immature Grans (Abs): 0 10*3/uL (ref 0.0–0.1)
Immature Granulocytes: 0 %
Lymphocytes Absolute: 1.7 10*3/uL (ref 0.7–3.1)
Lymphs: 30 %
MCH: 29.8 pg (ref 26.6–33.0)
MCHC: 32 g/dL (ref 31.5–35.7)
MCV: 93 fL (ref 79–97)
Monocytes Absolute: 0.5 10*3/uL (ref 0.1–0.9)
Monocytes: 9 %
Neutrophils Absolute: 3.2 10*3/uL (ref 1.4–7.0)
Neutrophils: 57 %
Platelets: 204 10*3/uL (ref 150–450)
RBC: 4.67 x10E6/uL (ref 3.77–5.28)
RDW: 13 % (ref 11.7–15.4)
WBC: 5.6 10*3/uL (ref 3.4–10.8)

## 2020-01-26 LAB — BASIC METABOLIC PANEL
BUN/Creatinine Ratio: 18 (ref 12–28)
BUN: 20 mg/dL (ref 8–27)
CO2: 16 mmol/L — ABNORMAL LOW (ref 20–29)
Calcium: 9.6 mg/dL (ref 8.7–10.3)
Chloride: 108 mmol/L — ABNORMAL HIGH (ref 96–106)
Creatinine, Ser: 1.13 mg/dL — ABNORMAL HIGH (ref 0.57–1.00)
GFR calc Af Amer: 55 mL/min/{1.73_m2} — ABNORMAL LOW (ref >59)
GFR calc non Af Amer: 48 mL/min/{1.73_m2} — ABNORMAL LOW (ref >59)
Glucose: 84 mg/dL (ref 65–99)
Potassium: 4.4 mmol/L (ref 3.5–5.2)
Sodium: 139 mmol/L (ref 134–144)

## 2020-01-26 LAB — SARS CORONAVIRUS 2 (TAT 6-24 HRS): SARS Coronavirus 2: NEGATIVE

## 2020-01-28 ENCOUNTER — Telehealth (HOSPITAL_COMMUNITY): Payer: Self-pay | Admitting: Family Medicine

## 2020-01-28 ENCOUNTER — Telehealth: Payer: Self-pay

## 2020-01-28 ENCOUNTER — Other Ambulatory Visit: Payer: Self-pay | Admitting: Cardiology

## 2020-01-28 ENCOUNTER — Telehealth (HOSPITAL_COMMUNITY): Payer: Self-pay | Admitting: *Deleted

## 2020-01-28 ENCOUNTER — Encounter (HOSPITAL_COMMUNITY): Payer: Medicare Other

## 2020-01-28 MED ORDER — METOPROLOL SUCCINATE ER 50 MG PO TB24: 75.0000 mg | ORAL_TABLET | Freq: Every day | ORAL | 1 refills | Status: DC

## 2020-01-28 NOTE — Telephone Encounter (Signed)
Spoke with patient. Will need clearance from Dr Oval Linsey to return to exercise at cardiac rehab.Barnet Pall, RN,BSN 01/28/2020 8:37 AM

## 2020-01-28 NOTE — Telephone Encounter (Signed)
-----   Message from Park Liter, MD sent at 01/27/2020  6:33 PM EDT ----- Overall laboratory test looking good.  Kidney function stable, no anemia

## 2020-01-28 NOTE — Telephone Encounter (Signed)
Spoke with patient regarding results and recommendation.  Patient verbalizes understanding and is agreeable to plan of care. Advised patient to call back with any issues or concerns.   The [facility] is stating that they did not receive the order for the patients metoprolol that was sent in on Friday. I have re-sent the prescription order at this time.

## 2020-01-29 ENCOUNTER — Ambulatory Visit (HOSPITAL_COMMUNITY)
Admission: RE | Admit: 2020-01-29 | Discharge: 2020-01-29 | Disposition: A | Discharge status: Home / Self Care | Payer: Medicare Other | Attending: Cardiovascular Disease | Admitting: Cardiovascular Disease

## 2020-01-29 ENCOUNTER — Encounter (HOSPITAL_COMMUNITY): Payer: Self-pay | Admitting: Cardiovascular Disease

## 2020-01-29 ENCOUNTER — Encounter (HOSPITAL_COMMUNITY)
Admission: RE | Disposition: A | Discharge status: Home / Self Care | Payer: Medicare Other | Source: Home / Self Care | Attending: Cardiovascular Disease

## 2020-01-29 ENCOUNTER — Ambulatory Visit (HOSPITAL_COMMUNITY): Payer: Medicare Other | Admitting: Certified Registered Nurse Anesthetist

## 2020-01-29 ENCOUNTER — Other Ambulatory Visit: Payer: Self-pay

## 2020-01-29 DIAGNOSIS — I4819 Other persistent atrial fibrillation: Secondary | ICD-10-CM

## 2020-01-29 DIAGNOSIS — I129 Hypertensive chronic kidney disease with stage 1 through stage 4 chronic kidney disease, or unspecified chronic kidney disease: Secondary | ICD-10-CM | POA: Diagnosis not present

## 2020-01-29 DIAGNOSIS — M1990 Unspecified osteoarthritis, unspecified site: Secondary | ICD-10-CM | POA: Diagnosis not present

## 2020-01-29 DIAGNOSIS — Z96641 Presence of right artificial hip joint: Secondary | ICD-10-CM | POA: Insufficient documentation

## 2020-01-29 DIAGNOSIS — Z79899 Other long term (current) drug therapy: Secondary | ICD-10-CM | POA: Insufficient documentation

## 2020-01-29 DIAGNOSIS — N183 Chronic kidney disease, stage 3 unspecified: Secondary | ICD-10-CM | POA: Insufficient documentation

## 2020-01-29 DIAGNOSIS — Z7902 Long term (current) use of antithrombotics/antiplatelets: Secondary | ICD-10-CM | POA: Insufficient documentation

## 2020-01-29 DIAGNOSIS — I252 Old myocardial infarction: Secondary | ICD-10-CM | POA: Diagnosis not present

## 2020-01-29 DIAGNOSIS — Z6841 Body Mass Index (BMI) 40.0 and over, adult: Secondary | ICD-10-CM | POA: Diagnosis not present

## 2020-01-29 DIAGNOSIS — Z955 Presence of coronary angioplasty implant and graft: Secondary | ICD-10-CM | POA: Diagnosis not present

## 2020-01-29 DIAGNOSIS — E669 Obesity, unspecified: Secondary | ICD-10-CM | POA: Diagnosis not present

## 2020-01-29 DIAGNOSIS — I2510 Atherosclerotic heart disease of native coronary artery without angina pectoris: Secondary | ICD-10-CM | POA: Diagnosis not present

## 2020-01-29 DIAGNOSIS — E785 Hyperlipidemia, unspecified: Secondary | ICD-10-CM | POA: Diagnosis not present

## 2020-01-29 DIAGNOSIS — Z8249 Family history of ischemic heart disease and other diseases of the circulatory system: Secondary | ICD-10-CM | POA: Diagnosis not present

## 2020-01-29 DIAGNOSIS — I255 Ischemic cardiomyopathy: Secondary | ICD-10-CM | POA: Diagnosis not present

## 2020-01-29 DIAGNOSIS — Z7901 Long term (current) use of anticoagulants: Secondary | ICD-10-CM | POA: Insufficient documentation

## 2020-01-29 HISTORY — PX: CARDIOVERSION: SHX1299

## 2020-01-29 SURGERY — CARDIOVERSION
Anesthesia: General

## 2020-01-29 MED ORDER — LIDOCAINE 2% (20 MG/ML) 5 ML SYRINGE
INTRAMUSCULAR | Status: DC | PRN
  Administered 2020-01-29: 20 mg via INTRAVENOUS

## 2020-01-29 MED ORDER — METOPROLOL SUCCINATE ER 50 MG PO TB24: 25.0000 mg | ORAL_TABLET | Freq: Every day | ORAL | 1 refills | Status: DC

## 2020-01-29 MED ORDER — SODIUM CHLORIDE 0.9 % IV SOLN: INTRAVENOUS | Status: DC

## 2020-01-29 MED ORDER — SODIUM CHLORIDE 0.9 % IV SOLN
INTRAVENOUS | Status: AC | PRN
  Administered 2020-01-29: 500 mL via INTRAVENOUS

## 2020-01-29 MED ORDER — PROPOFOL 10 MG/ML IV BOLUS
INTRAVENOUS | Status: DC | PRN
  Administered 2020-01-29: 70 mg via INTRAVENOUS

## 2020-01-29 NOTE — Interval H&P Note (Signed)
History and Physical Interval Note:  01/29/2020 8:51 AM  Melissa Clements  has presented today for surgery, with the diagnosis of AFIB.  The various methods of treatment have been discussed with the patient and family. After consideration of risks, benefits and other options for treatment, the patient has consented to  Procedure(s): CARDIOVERSION (N/A) as a surgical intervention.  The patient's history has been reviewed, patient examined, no change in status, stable for surgery.  I have reviewed the patient's chart and labs.  Questions were answered to the patient's satisfaction.     [name], MD

## 2020-01-29 NOTE — CV Procedure (Signed)
Electrical Cardioversion Procedure Note Melissa Clements [id number] [birth year]  Procedure: Electrical Cardioversion Indications:  Atrial Fibrillation  Procedure Details Consent: Risks of procedure as well as the alternatives and risks of each were explained to the (patient/caregiver).  Consent for procedure obtained. Time Out: Verified patient identification, verified procedure, site/side was marked, verified correct patient position, special equipment/implants available, medications/allergies/relevent history reviewed, required imaging and test results available.  Performed  Patient placed on cardiac monitor, pulse oximetry, supplemental oxygen as necessary.  Sedation given: propofol Pacer pads placed anterior and posterior chest.  Cardioverted 1 time(s).  Cardioverted at 150J.  Evaluation Findings: Post procedure EKG shows: sinus bradycardia with first degree AV block Complications: None Patient did tolerate procedure well.   Melissa Latch, MD 01/29/2020, 9:05 AM

## 2020-01-29 NOTE — Discharge Instructions (Signed)
Electrical Cardioversion Electrical cardioversion is the delivery of a jolt of electricity to restore a normal rhythm to the heart. A rhythm that is too fast or is not regular keeps the heart from pumping well. In this procedure, sticky patches or metal paddles are placed on the chest to deliver electricity to the heart from a device. This procedure may be done in an emergency if:  There is low or no blood pressure as a result of the heart rhythm.  Normal rhythm must be restored as fast as possible to protect the brain and heart from further damage.  It may save a life. This may also be a scheduled procedure for irregular or fast heart rhythms that are not immediately life-threatening. Tell a health care provider about:  Any allergies you have.  All medicines you are taking, including vitamins, herbs, eye drops, creams, and over-the-counter medicines.  Any problems you or family members have had with anesthetic medicines.  Any blood disorders you have.  Any surgeries you have had.  Any medical conditions you have.  Whether you are pregnant or may be pregnant. What are the risks? Generally, this is a safe procedure. However, problems may occur, including:  Allergic reactions to medicines.  A blood clot that breaks free and travels to other parts of your body.  The possible return of an abnormal heart rhythm within hours or days after the procedure.  Your heart stopping (cardiac arrest). This is rare. What happens before the procedure? Medicines  Your health care provider may have you start taking: ? Blood-thinning medicines (anticoagulants) so your blood does not clot as easily. ? Medicines to help stabilize your heart rate and rhythm.  Ask your health care provider about: ? Changing or stopping your regular medicines. This is especially important if you are taking diabetes medicines or blood thinners. ? Taking medicines such as aspirin and ibuprofen. These medicines can  thin your blood. Do not take these medicines unless your health care provider tells you to take them. ? Taking over-the-counter medicines, vitamins, herbs, and supplements. General instructions  Follow instructions from your health care provider about eating or drinking restrictions.  Plan to have someone take you home from the hospital or clinic.  If you will be going home right after the procedure, plan to have someone with you for 24 hours.  Ask your health care provider what steps will be taken to help prevent infection. These may include washing your skin with a germ-killing soap. What happens during the procedure?   An IV will be inserted into one of your veins.  Sticky patches (electrodes) or metal paddles may be placed on your chest.  You will be given a medicine to help you relax (sedative).  An electrical shock will be delivered. The procedure may vary among health care providers and hospitals. What can I expect after the procedure?  Your blood pressure, heart rate, breathing rate, and blood oxygen level will be monitored until you leave the hospital or clinic.  Your heart rhythm will be watched to make sure it does not change.  You may have some redness on the skin where the shocks were given. Follow these instructions at home:  Do not drive for 24 hours if you were given a sedative during your procedure.  Take over-the-counter and prescription medicines only as told by your health care provider.  Ask your health care provider how to check your pulse. Check it often.  Rest for 48 hours after the procedure or  as told by your health care provider.  Avoid or limit your caffeine use as told by your health care provider.  Keep all follow-up visits as told by your health care provider. This is important. Contact a health care provider if:  You feel like your heart is beating too quickly or your pulse is not regular.  You have a serious muscle cramp that does not go  away. Get help right away if:  You have discomfort in your chest.  You are dizzy or you feel faint.  You have trouble breathing or you are short of breath.  Your speech is slurred.  You have trouble moving an arm or leg on one side of your body.  Your fingers or toes turn cold or blue. Summary  Electrical cardioversion is the delivery of a jolt of electricity to restore a normal rhythm to the heart.  This procedure may be done right away in an emergency or may be a scheduled procedure if the condition is not an emergency.  Generally, this is a safe procedure.  After the procedure, check your pulse often as told by your health care provider. This information is not intended to replace advice given to you by your health care provider. Make sure you discuss any questions you have with your health care provider. Document Revised: 04/23/2019 Document Reviewed: 04/23/2019 Elsevier Patient Education  [employer]. It is OK for you to resume cardiac rehab tomorrow.

## 2020-01-29 NOTE — Anesthesia Procedure Notes (Signed)
Procedure Name: MAC Date/Time: 01/29/2020 8:58 AM Performed by: Leonor Liv, CRNA Pre-anesthesia Checklist: Patient identified, Emergency Drugs available, Suction available and Patient being monitored Patient Re-evaluated:Patient Re-evaluated prior to induction Oxygen Delivery Method: Ambu bag Dental Injury: Teeth and Oropharynx as per pre-operative assessment

## 2020-01-29 NOTE — Transfer of Care (Signed)
Immediate Anesthesia Transfer of Care Note  Patient: BREIANNA GUZZO  Procedure(s) Performed: CARDIOVERSION (N/A )  Patient Location: Endoscopy Unit  Anesthesia Type:General  Level of Consciousness: awake, alert  and oriented  Airway & Oxygen Therapy: Patient Spontanous Breathing and Patient connected to nasal cannula oxygen  Post-op Assessment: Report given to RN, Post -op Vital signs reviewed and stable and Patient moving all extremities  Post vital signs: Reviewed and stable  Last Vitals:  Vitals Value Taken Time  BP 83/35 01/29/20 0907  Temp    Pulse 45 01/29/20 0909  Resp 18 01/29/20 0909  SpO2 99 % 01/29/20 0909  Vitals shown include unvalidated device data.  Last Pain:  Vitals:   01/29/20 0823  TempSrc: Oral  PainSc: 0-No pain         Complications: No apparent anesthesia complications

## 2020-01-29 NOTE — Anesthesia Preprocedure Evaluation (Addendum)
Anesthesia Evaluation  Patient identified by MRN, date of birth, ID band Patient awake    Reviewed: Allergy & Precautions, NPO status , Patient's Chart, lab work & pertinent test results  Airway Mallampati: II  TM Distance: >3 FB Neck ROM: Full    Dental  (+) Teeth Intact, Dental Advisory Given, Caps   Pulmonary    breath sounds clear to auscultation       Cardiovascular hypertension, + CAD and + Past MI   Rhythm:Regular Rate:Normal     Neuro/Psych    GI/Hepatic negative GI ROS, Neg liver ROS,   Endo/Other    Renal/GU Renal disease     Musculoskeletal   Abdominal   Peds  Hematology   Anesthesia Other Findings   Reproductive/Obstetrics                           Anesthesia Physical Anesthesia Plan  ASA: III  Anesthesia Plan: General   Post-op Pain Management:    Induction: Intravenous  PONV Risk Score and Plan: 3 and Propofol infusion  Airway Management Planned: Nasal Cannula and Simple Face Mask  Additional Equipment:   Intra-op Plan:   Post-operative Plan:   Informed Consent: I have reviewed the patients History and Physical, chart, labs and discussed the procedure including the risks, benefits and alternatives for the proposed anesthesia with the patient or authorized representative who has indicated his/her understanding and acceptance.     Dental advisory given  Plan Discussed with: CRNA and Anesthesiologist  Anesthesia Plan Comments:         Anesthesia Quick Evaluation

## 2020-01-29 NOTE — Anesthesia Postprocedure Evaluation (Signed)
Anesthesia Post Note  Patient: Melissa Clements  Procedure(s) Performed: CARDIOVERSION (N/A )     Patient location during evaluation: Endoscopy Anesthesia Type: General Level of consciousness: awake Pain management: pain level controlled Vital Signs Assessment: post-procedure vital signs reviewed and stable Respiratory status: spontaneous breathing Cardiovascular status: blood pressure returned to baseline Postop Assessment: no apparent nausea or vomiting Anesthetic complications: no    Last Vitals:  Vitals:   01/29/20 0920 01/29/20 0925  BP: (!) 87/36 101/70  Pulse: (!) 45 (!) 43  Resp: 10 14  Temp:    SpO2: 99% 100%    Last Pain:  Vitals:   01/29/20 0925  TempSrc:   PainSc: 0-No pain                 Autym Siess

## 2020-01-30 ENCOUNTER — Telehealth: Payer: Self-pay | Admitting: Cardiovascular Disease

## 2020-01-30 ENCOUNTER — Encounter (HOSPITAL_COMMUNITY)
Admission: RE | Admit: 2020-01-30 | Discharge: 2020-01-30 | Disposition: A | Discharge status: Home / Self Care | Payer: Medicare Other | Source: Ambulatory Visit | Attending: Cardiology | Admitting: Cardiology

## 2020-01-30 ENCOUNTER — Encounter (HOSPITAL_COMMUNITY): Payer: Medicare Other

## 2020-01-30 ENCOUNTER — Telehealth (HOSPITAL_COMMUNITY): Payer: Self-pay | Admitting: *Deleted

## 2020-01-30 ENCOUNTER — Telehealth: Payer: Self-pay | Admitting: Cardiology

## 2020-01-30 DIAGNOSIS — Z955 Presence of coronary angioplasty implant and graft: Secondary | ICD-10-CM

## 2020-01-30 DIAGNOSIS — I214 Non-ST elevation (NSTEMI) myocardial infarction: Secondary | ICD-10-CM

## 2020-01-30 NOTE — Telephone Encounter (Signed)
The patient needs clearance to continue exercising at Cardiac Rehab. She had a Cardioversion yesterday and is not cleared to continue until Dr. Agustin Cree gives orders. Dr. Agustin Cree is not in the clinic today. The patient asked if Dr. Oval Linsey could clear her because she was the one to do the procedure but because Dr. Oval Linsey is not her primary Cardiologist she can not give orders for the patient to continue exercise. Please clear the patient to continue with Cardiac Rehab

## 2020-01-30 NOTE — Telephone Encounter (Signed)
Please see additional telephone encounter.

## 2020-01-30 NOTE — Progress Notes (Signed)
Patient came to cardiac rehab. Explained to the patient that a note is needed from the cardiologist post cardioversion to return to exercise. Message sent to Dr Oval Linsey via epic. Patient was upset that she could not exercise today and voiced dissatisfaction regarding this. I apologized to the patient and let her speak with out Assistant director Ramon Dredge RN [place] regarding this matter.Barnet Pall, RN,BSN 01/30/2020 3:31 PM

## 2020-01-30 NOTE — Telephone Encounter (Signed)
Called patient she reports she was converted to sinus rhythm after cardioversion yestyerday she reports she feels fine and her heart rate has been in the 50's today so she held her metoprolol. Discuss with Dr. Harriet Masson since Dr. Agustin Cree is off. She advised patient is ok to go back to cardiac rehab. Tried to call cardiac rehab to inform them of this, no answer will continue efforts.

## 2020-01-30 NOTE — Telephone Encounter (Signed)
New Message:      Please call Verdis Frederickson at Cardiac Rehab. She have the pt there and needs clearance for her to do Rehab

## 2020-01-31 ENCOUNTER — Telehealth (HOSPITAL_COMMUNITY): Payer: Self-pay | Admitting: *Deleted

## 2020-01-31 NOTE — Telephone Encounter (Signed)
Spoke to patient's husband. Notified that Anvika may return to exercise per Dr Harriet Masson.Barnet Pall, RN,BSN 01/31/2020 2:57 PM

## 2020-01-31 NOTE — Telephone Encounter (Signed)
Spoke to Barnet Pall, RN with cardiac rehab she will reach out and let patient know she can start back cardiac rehab.

## 2020-02-01 ENCOUNTER — Encounter (HOSPITAL_COMMUNITY): Payer: Medicare Other

## 2020-02-03 NOTE — Telephone Encounter (Signed)
Thank you.  Yes I wrote that on her discharge papers but I guess she didn't see it.

## 2020-02-04 ENCOUNTER — Encounter (HOSPITAL_COMMUNITY)
Admission: RE | Admit: 2020-02-04 | Discharge: 2020-02-04 | Disposition: A | Discharge status: Home / Self Care | Payer: Medicare Other | Source: Ambulatory Visit | Attending: Cardiology | Admitting: Cardiology

## 2020-02-04 ENCOUNTER — Other Ambulatory Visit: Payer: Self-pay

## 2020-02-04 DIAGNOSIS — I129 Hypertensive chronic kidney disease with stage 1 through stage 4 chronic kidney disease, or unspecified chronic kidney disease: Secondary | ICD-10-CM | POA: Insufficient documentation

## 2020-02-04 DIAGNOSIS — I4819 Other persistent atrial fibrillation: Secondary | ICD-10-CM | POA: Diagnosis not present

## 2020-02-04 DIAGNOSIS — Z955 Presence of coronary angioplasty implant and graft: Secondary | ICD-10-CM | POA: Diagnosis not present

## 2020-02-04 DIAGNOSIS — I252 Old myocardial infarction: Secondary | ICD-10-CM | POA: Diagnosis present

## 2020-02-04 DIAGNOSIS — E785 Hyperlipidemia, unspecified: Secondary | ICD-10-CM | POA: Insufficient documentation

## 2020-02-04 DIAGNOSIS — Z79899 Other long term (current) drug therapy: Secondary | ICD-10-CM | POA: Insufficient documentation

## 2020-02-04 DIAGNOSIS — Z7901 Long term (current) use of anticoagulants: Secondary | ICD-10-CM | POA: Insufficient documentation

## 2020-02-04 DIAGNOSIS — Z96641 Presence of right artificial hip joint: Secondary | ICD-10-CM | POA: Diagnosis not present

## 2020-02-04 DIAGNOSIS — Z7902 Long term (current) use of antithrombotics/antiplatelets: Secondary | ICD-10-CM | POA: Insufficient documentation

## 2020-02-04 DIAGNOSIS — N183 Chronic kidney disease, stage 3 unspecified: Secondary | ICD-10-CM | POA: Diagnosis not present

## 2020-02-04 DIAGNOSIS — I214 Non-ST elevation (NSTEMI) myocardial infarction: Secondary | ICD-10-CM

## 2020-02-04 NOTE — Progress Notes (Signed)
Daily Session Note  Patient Details  Name: SUNITA DEMOND MRN: [id number] Date of Birth: 04-Jul-1946 Referring Provider:     CARDIAC REHAB PHASE II ORIENTATION from 01/10/2020 in [place]  Referring Provider  Park Liter MD      Encounter Date: 02/04/2020  Check In: Session Check In - 02/04/20 0928      Check-In   Supervising physician immediately available to respond to emergencies  [facility] immediately available    Physician(s)  Dr. Sherral Hammers    Location  [facility]    Staff Present  Dorma Russell, MS,ACSM CEP, Exercise Physiologist;Avalyn Molino Venetia Maxon, RN, BSN;Other;Dalton Kris Mouton, MS, Exercise Physiologist;Olinty Celesta Aver, MS, ACSM CEP, Exercise Physiologist    Virtual Visit  No    Medication changes reported      No    Fall or balance concerns reported     No    Tobacco Cessation  No Change    Warm-up and Cool-down  Performed on first and last piece of equipment    Resistance Training Performed  Yes    VAD Patient?  No    PAD/SET Patient?  No      Pain Assessment   Currently in Pain?  No/denies    Pain Score  0-No pain    Multiple Pain Sites  No       Capillary Blood Glucose: No results found for this or any previous visit (from the past 24 hour(s)).    Social History   Tobacco Use  Smoking Status Never Smoker  Smokeless Tobacco Never Used    Goals Met:  No report of cardiac concerns or symptoms  Goals Unmet:  Not Applicable  Comments: Aaliayah returned to exercise at cardiac rehab and exercised without difficulty. Telemetry rhythm Sinus rhythm first degree heart block, PAC's.Barnet Pall, RN,BSN 02/04/2020 9:36 AM    Dr. Fransico Him is Medical Director for Cardiac Rehab at [facility].

## 2020-02-06 ENCOUNTER — Other Ambulatory Visit: Payer: Self-pay

## 2020-02-06 ENCOUNTER — Encounter (HOSPITAL_COMMUNITY)
Admission: RE | Admit: 2020-02-06 | Discharge: 2020-02-06 | Disposition: A | Discharge status: Home / Self Care | Payer: Medicare Other | Source: Ambulatory Visit | Attending: Cardiology | Admitting: Cardiology

## 2020-02-06 ENCOUNTER — Encounter (HOSPITAL_COMMUNITY): Payer: Medicare Other

## 2020-02-06 DIAGNOSIS — Z955 Presence of coronary angioplasty implant and graft: Secondary | ICD-10-CM

## 2020-02-06 DIAGNOSIS — I214 Non-ST elevation (NSTEMI) myocardial infarction: Secondary | ICD-10-CM

## 2020-02-06 DIAGNOSIS — I252 Old myocardial infarction: Secondary | ICD-10-CM | POA: Diagnosis not present

## 2020-02-06 NOTE — Progress Notes (Signed)
Cardiac Individual Treatment Plan  Patient Details  Name: Melissa Clements MRN: [id number] Date of Birth: 1946/03/01 Referring Provider:     CARDIAC REHAB PHASE II ORIENTATION from 01/10/2020 in [place]  Referring Provider  Park Liter MD      Initial Encounter Date:    CARDIAC REHAB PHASE II ORIENTATION from 01/10/2020 in [facility]  Date  01/10/20      Visit Diagnosis: NSTEMI (non-ST elevated myocardial infarction) ([facility]) 11/01/19  Status post coronary artery stent placement S/P DES LAD 11/02/19  Patient's Home Medications on Admission:  Current Outpatient Medications:  .  atorvastatin (LIPITOR) 80 MG tablet, Take 1 tablet (80 mg total) by mouth daily at 6 PM., Disp: 90 tablet, Rfl: 3 .  Cholecalciferol (VITAMIN D) 2000 units tablet, Take 2,000 Units by mouth every other day. , Disp: , Rfl:  .  clopidogrel (PLAVIX) 75 MG tablet, Take 1 tablet (75 mg total) by mouth daily with breakfast., Disp: 90 tablet, Rfl: 3 .  Collagen-Boron-Hyaluronic Acid (MOVE FREE ULTRA JOINT HEALTH PO), Take 2 tablets by mouth daily. , Disp: , Rfl:  .  docusate sodium (COLACE) 100 MG capsule, Take 100 mg by mouth daily., Disp: , Rfl:  .  lisinopril (ZESTRIL) 2.5 MG tablet, Take 1 tablet (2.5 mg total) by mouth daily., Disp: 90 tablet, Rfl: 3 .  metoprolol succinate (TOPROL-XL) 50 MG 24 hr tablet, Take 0.5 tablets (25 mg total) by mouth daily. Hold if heart rate is <60 bpm, Disp: 90 tablet, Rfl: 1 .  nitroGLYCERIN (NITROSTAT) 0.4 MG SL tablet, Place 1 tablet (0.4 mg total) under the tongue every 5 (five) minutes x 3 doses as needed for chest pain., Disp: 25 tablet, Rfl: 3 .  oxymetazoline (AFRIN) 0.05 % nasal spray, Place 1 spray into both nostrils daily as needed (nose bleeds)., Disp: , Rfl:  .  rivaroxaban (XARELTO) 20 MG TABS tablet, Take 1 tablet (20 mg total) by mouth daily with supper., Disp: 30 tablet, Rfl: 12  Past Medical  History: Past Medical History:  Diagnosis Date  . Arthritis    end stage right hip  . Basal cell carcinoma   . Benign essential HTN 01/23/2015  . CAD S/P percutaneous coronary angioplasty 11/06/2019   LAD PCI with DES 11/02/2019  . Chronic kidney disease    stage III, related to high blood pressure medication  . Colon polyp   . CRI (chronic renal insufficiency), stage 3 (moderate) 11/06/2019   No ACE or ARB  . Depression   . Dizziness 01/23/2015  . Dyslipidemia, goal LDL below 70 11/06/2019   LDL 84- changed from Zocor to Lipitor 80 mg  . Dyspnea on exertion 11/01/2019  . Hematoma 11/06/2019   Hematoma and ecchymosis Rt radial site post PCI-(anticoagulation for new AF held).   . History of chest pain   . History of dizziness   . History of palpitations   . Hyperlipidemia   . Hypertension   . Ischemic cardiomyopathy 11/06/2019   EF 45%  . NSTEMI (non-ST elevated myocardial infarction) ([place]) 11/01/2019   S/P NSTEMI 11/03/2019  . Obese 01/25/2018  . Palpitations 01/23/2015  . Persistent atrial fibrillation ([place])    New diagnosis 11/03/2019- not anticoagulated yet- minimal symptoms- rate controlled  . Precordial chest pain 01/23/2015   Negative stress test in the spring 2019  . S/P right THA, AA 01/24/2018  . Sinus bradycardia 01/20/2018    Tobacco Use: Social History  Tobacco Use  Smoking Status Never Smoker  Smokeless Tobacco Never Used    Labs: Recent Review Flowsheet Data    Labs for ITP Cardiac and Pulmonary Rehab Latest Ref Rng & Units 11/01/2019 11/02/2019 12/24/2019   Cholestrol 100 - 199 mg/dL 181 153 132   LDLCALC 0 - 99 mg/dL NOT CALCULATED 84 57   HDL >39 mg/dL 66 58 57   Trlycerides 0 - 149 mg/dL 78 55 97   Hemoglobin A1c 4.8 - 5.6 % 5.5 - -      Capillary Blood Glucose: No results found for: GLUCAP   Exercise Target Goals: Exercise Program Goal: Individual exercise prescription set using results from initial 6 min walk test and THRR while considering  patient's  activity barriers and safety.   Exercise Prescription Goal: Starting with aerobic activity 30 plus minutes a day, 3 days per week for initial exercise prescription. Provide home exercise prescription and guidelines that participant acknowledges understanding prior to discharge.  Activity Barriers & Risk Stratification: Activity Barriers & Cardiac Risk Stratification - 01/10/20 1135      Activity Barriers & Cardiac Risk Stratification   Activity Barriers  Right Hip Replacement    Cardiac Risk Stratification  High       6 Minute Walk: 6 Minute Walk    Row Name 01/10/20 1129 01/10/20 1139       6 Minute Walk   Phase  Initial  --    Distance  739 feet  --    Walk Time  6 minutes 4 minutes total walking  --    # of Rest Breaks  1  --    MPH  1.4  --    METS  1.34  --    RPE  12  --    Perceived Dyspnea   2  --    VO2 Peak  4.7  --    Symptoms  No  Yes (comment)    Comments  --  Pt took 2 minute rest break due to SOB +2. Pt also reported 2/10 right hip pain    Resting HR  98 bpm  --    Resting BP  114/80  --    Resting Oxygen Saturation   98 %  --    Exercise Oxygen Saturation  during 6 min walk  98 %  --    Max Ex. HR  129 bpm  --    Max Ex. BP  142/86  --    2 Minute Post BP  120/76  --       Oxygen Initial Assessment:   Oxygen Re-Evaluation:   Oxygen Discharge (Final Oxygen Re-Evaluation):   Initial Exercise Prescription: Initial Exercise Prescription - 01/10/20 1100      Date of Initial Exercise RX and Referring Provider   Date  01/10/20    Referring Provider  Park Liter MD    Expected Discharge Date  03/07/20      NuStep   Level  2    SPM  85    Minutes  15    METs  2      Arm Ergometer   Level  1.5    Watts  15    Minutes  15    METs  2      Prescription Details   Frequency (times per week)  3x    Duration  Progress to 30 minutes of continuous aerobic without signs/symptoms of physical distress      Intensity  THRR 40-80% of Max  Heartrate  58-112    Ratings of Perceived Exertion  11-13    Perceived Dyspnea  0-4      Progression   Progression  Continue progressive overload as per policy without signs/symptoms or physical distress.      Resistance Training   Training Prescription  Yes    Weight  2lbs    Reps  10-15       Perform Capillary Blood Glucose checks as needed.  Exercise Prescription Changes:  Exercise Prescription Changes    Row Name 01/15/20 0900 01/25/20 1406           Response to Exercise   Blood Pressure (Admit)  118/88  118/68      Blood Pressure (Exercise)  124/72  122/68      Blood Pressure (Exit)  114/72  108/70      Heart Rate (Admit)  100 bpm  92 bpm      Heart Rate (Exercise)  99 bpm  98 bpm      Heart Rate (Exit)  91 bpm  92 bpm      Rating of Perceived Exertion (Exercise)  13  11      Perceived Dyspnea (Exercise)  --  0      Symptoms  --  None      Comments  --  Pt tolerated exercise well      Duration  Continue with 30 min of aerobic exercise without signs/symptoms of physical distress.  Continue with 30 min of aerobic exercise without signs/symptoms of physical distress.      Intensity  THRR unchanged  THRR unchanged        Progression   Progression  Continue to progress workloads to maintain intensity without signs/symptoms of physical distress.  Continue to progress workloads to maintain intensity without signs/symptoms of physical distress.      Average METs  --  2.1        Resistance Training   Training Prescription  Yes  Yes      Weight  2 lbs  2 lbs      Reps  10-15  10-15      Time  --  10 Minutes        NuStep   Level  2  2      SPM  85  85      Minutes  15  15      METs  1.7  1.7        Arm Ergometer   Level  15  15      Minutes  15  15      METs  --  2.1         Exercise Comments:  Exercise Comments    Row Name 01/15/20 0904 02/07/20 0856         Exercise Comments  Pt completed her first exercise session in cardiac rehab. She tolerated  exercise well with no complaints.  Pt is tolerating exercise prescription well. Will follow up with pt regarding exercise at home. Will continue to progress workloads as tolerated.         Exercise Goals and Review:  Exercise Goals    Row Name 01/10/20 1135             Exercise Goals   Increase Physical Activity  Yes       Intervention  Provide advice, education, support and counseling about physical activity/exercise needs.;Develop an individualized exercise prescription for aerobic  and resistive training based on initial evaluation findings, risk stratification, comorbidities and participant's personal goals.       Expected Outcomes  Short Term: Attend rehab on a regular basis to increase amount of physical activity.;Long Term: Add in home exercise to make exercise part of routine and to increase amount of physical activity.;Long Term: Exercising regularly at least 3-5 days a week.       Increase Strength and Stamina  Yes       Intervention  Provide advice, education, support and counseling about physical activity/exercise needs.;Develop an individualized exercise prescription for aerobic and resistive training based on initial evaluation findings, risk stratification, comorbidities and participant's personal goals.       Expected Outcomes  Short Term: Increase workloads from initial exercise prescription for resistance, speed, and METs.;Short Term: Perform resistance training exercises routinely during rehab and add in resistance training at home;Long Term: Improve cardiorespiratory fitness, muscular endurance and strength as measured by increased METs and functional capacity (6MWT)       Able to understand and use rate of perceived exertion (RPE) scale  Yes       Intervention  Provide education and explanation on how to use RPE scale       Expected Outcomes  Short Term: Able to use RPE daily in rehab to express subjective intensity level;Long Term:  Able to use RPE to guide intensity level  when exercising independently       Knowledge and understanding of Target Heart Rate Range (THRR)  Yes       Intervention  Provide education and explanation of THRR including how the numbers were predicted and where they are located for reference       Expected Outcomes  Short Term: Able to state/look up THRR;Long Term: Able to use THRR to govern intensity when exercising independently;Short Term: Able to use daily as guideline for intensity in rehab       Able to check pulse independently  Yes       Intervention  Provide education and demonstration on how to check pulse in carotid and radial arteries.;Review the importance of being able to check your own pulse for safety during independent exercise       Expected Outcomes  Short Term: Able to explain why pulse checking is important during independent exercise;Long Term: Able to check pulse independently and accurately       Understanding of Exercise Prescription  Yes       Intervention  Provide education, explanation, and written materials on patient's individual exercise prescription       Expected Outcomes  Short Term: Able to explain program exercise prescription;Long Term: Able to explain home exercise prescription to exercise independently          Exercise Goals Re-Evaluation :    Discharge Exercise Prescription (Final Exercise Prescription Changes): Exercise Prescription Changes - 01/25/20 1406      Response to Exercise   Blood Pressure (Admit)  118/68    Blood Pressure (Exercise)  122/68    Blood Pressure (Exit)  108/70    Heart Rate (Admit)  92 bpm    Heart Rate (Exercise)  98 bpm    Heart Rate (Exit)  92 bpm    Rating of Perceived Exertion (Exercise)  11    Perceived Dyspnea (Exercise)  0    Symptoms  None    Comments  Pt tolerated exercise well    Duration  Continue with 30 min of aerobic exercise without signs/symptoms of physical  distress.    Intensity  THRR unchanged      Progression   Progression  Continue to  progress workloads to maintain intensity without signs/symptoms of physical distress.    Average METs  2.1      Resistance Training   Training Prescription  Yes    Weight  2 lbs    Reps  10-15    Time  10 Minutes      NuStep   Level  2    SPM  85    Minutes  15    METs  1.7      Arm Ergometer   Level  15    Minutes  15    METs  2.1       Nutrition:  Target Goals: Understanding of nutrition guidelines, daily intake of sodium 1500mg , cholesterol 200mg , calories 30% from fat and 7% or less from saturated fats, daily to have 5 or more servings of fruits and vegetables.  Biometrics: Pre Biometrics - 01/10/20 1134      Pre Biometrics   Height  5' 3.25" (1.607 m)    Weight  105.5 kg    Waist Circumference  48 inches    Hip Circumference  55 inches    Waist to Hip Ratio  0.87 %    BMI (Calculated)  40.85    Triceps Skinfold  33 mm    % Body Fat  52.3 %    Grip Strength  26 kg    Flexibility  15.5 in    Single Leg Stand  15 seconds        Nutrition Therapy Plan and Nutrition Goals: Nutrition Therapy & Goals - 01/16/20 1447      Nutrition Therapy   Diet  Heart healthy      Personal Nutrition Goals   Nutrition Goal  Pt to identify food quantities necessary to achieve weight loss of 6-24 lb at graduation from cardiac rehab.    Personal Goal #2  Pt to build a healthy plate including vegetables, fruits, whole grains, and low-fat dairy products in a heart healthy meal plan.      Intervention Plan   Intervention  Nutrition handout(s) given to patient.;Prescribe, educate and counsel regarding individualized specific dietary modifications aiming towards targeted core components such as weight, hypertension, lipid management, diabetes, heart failure and other comorbidities.    Expected Outcomes  Short Term Goal: A plan has been developed with personal nutrition goals set during dietitian appointment.;Long Term Goal: Adherence to prescribed nutrition plan.       Nutrition  Assessments: Nutrition Assessments - 01/14/20 1329      MEDFICTS Scores   Pre Score  24       Nutrition Goals Re-Evaluation: Nutrition Goals Re-Evaluation    [place] Name 01/16/20 1447             Goals   Current Weight  232 lb (105.2 kg)       Expected Outcome  Weight loss and increased physical activity          Nutrition Goals Discharge (Final Nutrition Goals Re-Evaluation): Nutrition Goals Re-Evaluation - 01/16/20 1447      Goals   Current Weight  232 lb (105.2 kg)    Expected Outcome  Weight loss and increased physical activity       Psychosocial: Target Goals: Acknowledge presence or absence of significant depression and/or stress, maximize coping skills, provide positive support system. Participant is able to verbalize types and ability to use techniques  and skills needed for reducing stress and depression.  Initial Review & Psychosocial Screening: Initial Psych Review & Screening - 01/10/20 1255      Initial Review   Current issues with  Current Depression;Current Stress Concerns    Source of Stress Concerns  Chronic Illness;Family;Unable to perform yard/household activities    Comments  Karelyn reports that she has been depressed due to family issues and decreased fitness level. Shanedra does have home stressors with an adult daughter living at home      [place]?  Yes   Emmalia has her husband for support   Comments  Fleurette was offered to meet with a hospital counselor. Brentlee declined at this time      Barriers   Psychosocial barriers to participate in program  The patient should benefit from training in stress management and relaxation.      Screening Interventions   Interventions  Encouraged to exercise;Provide feedback about the scores to participant;To provide support and resources with identified psychosocial needs    Expected Outcomes  Long Term Goal: Stressors or current issues are controlled or eliminated.;Short Term goal:  Identification and review with participant of any Quality of Life or Depression concerns found by scoring the questionnaire.;Long Term goal: The participant improves quality of Life and PHQ9 Scores as seen by post scores and/or verbalization of changes       Quality of Life Scores: Quality of Life - 01/10/20 1124      Quality of Life   Select  Quality of Life      Quality of Life Scores   Health/Function Pre  16.54 %    Socioeconomic Pre  21.17 %    Psych/Spiritual Pre  17.5 %    Family Pre  14.8 %    GLOBAL Pre  17.34 %      Scores of 19 and below usually indicate a poorer quality of life in these areas.  A difference of  2-3 points is a clinically meaningful difference.  A difference of 2-3 points in the total score of the Quality of Life Index has been associated with significant improvement in overall quality of life, self-image, physical symptoms, and general health in studies assessing change in quality of life.  PHQ-9: Recent Review Flowsheet Data    Depression screen [facility] 2/9 01/10/2020   Decreased Interest 0    Down, Depressed, Hopeless 1   PHQ - 2 Score 1     Interpretation of Total Score  Total Score Depression Severity:  1-4 = Minimal depression, 5-9 = Mild depression, 10-14 = Moderate depression, 15-19 = Moderately severe depression, 20-27 = Severe depression   Psychosocial Evaluation and Intervention:   Psychosocial Re-Evaluation: Psychosocial Re-Evaluation    [place] Name 02/06/20 1638             Psychosocial Re-Evaluation   Current issues with  Current Stress Concerns       Comments  Divina has not mentioned any increased stressors       Expected Outcomes  Will offer emotional support as needed       Interventions  Encouraged to attend Cardiac Rehabilitation for the exercise;Stress management education       Comments  Daziyah reports that she has been depressed due to family issues and decreased fitness level. Jalen does have home stressors with an adult  daughter living at home         Initial Review   Source of Stress Concerns  Chronic Illness;Family;Unable to perform yard/household activities          Psychosocial Discharge (Final Psychosocial Re-Evaluation): Psychosocial Re-Evaluation - 02/06/20 1638      Psychosocial Re-Evaluation   Current issues with  Current Stress Concerns    Comments  Lollie has not mentioned any increased stressors    Expected Outcomes  Will offer emotional support as needed    Interventions  Encouraged to attend Cardiac Rehabilitation for the exercise;Stress management education    Comments  Jaidah reports that she has been depressed due to family issues and decreased fitness level. Euretta does have home stressors with an adult daughter living at home      Initial Review   Source of Stress Concerns  Chronic Illness;Family;Unable to perform yard/household activities       Vocational Rehabilitation: Provide vocational rehab assistance to qualifying candidates.   Vocational Rehab Evaluation & Intervention: Vocational Rehab - 01/10/20 1125      Initial Vocational Rehab Evaluation & Intervention   Assessment shows need for Vocational Rehabilitation  No       Education: Education Goals: Education classes will be provided on a weekly basis, covering required topics. Participant will state understanding/return demonstration of topics presented.  Learning Barriers/Preferences: Learning Barriers/Preferences - 01/10/20 1140      Learning Barriers/Preferences   Learning Barriers  None    Learning Preferences  Written Material;Verbal Instruction;Skilled Demonstration       Education Topics: Hypertension, Hypertension Reduction -Define heart disease and high blood pressure. Discus how high blood pressure affects the body and ways to reduce high blood pressure.   Exercise and Your Heart -Discuss why it is important to exercise, the FITT principles of exercise, normal and abnormal responses to exercise,  and how to exercise safely.   Angina -Discuss definition of angina, causes of angina, treatment of angina, and how to decrease risk of having angina.   Cardiac Medications -Review what the following cardiac medications are used for, how they affect the body, and side effects that may occur when taking the medications.  Medications include Aspirin, Beta blockers, calcium channel blockers, ACE Inhibitors, angiotensin receptor blockers, diuretics, digoxin, and antihyperlipidemics.   Congestive Heart Failure -Discuss the definition of CHF, how to live with CHF, the signs and symptoms of CHF, and how keep track of weight and sodium intake.   Heart Disease and Intimacy -Discus the effect sexual activity has on the heart, how changes occur during intimacy as we age, and safety during sexual activity.   Smoking Cessation / COPD -Discuss different methods to quit smoking, the health benefits of quitting smoking, and the definition of COPD.   Nutrition I: Fats -Discuss the types of cholesterol, what cholesterol does to the heart, and how cholesterol levels can be controlled.   Nutrition II: Labels -Discuss the different components of food labels and how to read food label   Heart Parts/Heart Disease and PAD -Discuss the anatomy of the heart, the pathway of blood circulation through the heart, and these are affected by heart disease.   Stress I: Signs and Symptoms -Discuss the causes of stress, how stress may lead to anxiety and depression, and ways to limit stress.   Stress II: Relaxation -Discuss different types of relaxation techniques to limit stress.   Warning Signs of Stroke / TIA -Discuss definition of a stroke, what the signs and symptoms are of a stroke, and how to identify when someone is having stroke.   Knowledge Questionnaire Score: Knowledge Questionnaire Score - 01/10/20  1124      Knowledge Questionnaire Score   Pre Score  20/24       Core Components/Risk  Factors/Patient Goals at Admission: Personal Goals and Risk Factors at Admission - 01/10/20 1300      Core Components/Risk Factors/Patient Goals on Admission    Weight Management  Yes;Obesity;Weight Maintenance;Weight Loss    Intervention  Weight Management: Develop a combined nutrition and exercise program designed to reach desired caloric intake, while maintaining appropriate intake of nutrient and fiber, sodium and fats, and appropriate energy expenditure required for the weight goal.;Weight Management: Provide education and appropriate resources to help participant work on and attain dietary goals.;Weight Management/Obesity: Establish reasonable short term and long term weight goals.;Obesity: Provide education and appropriate resources to help participant work on and attain dietary goals.    Admit Weight  232 lb 9.4 oz (105.5 kg)    Hypertension  Yes    Intervention  Provide education on lifestyle modifcations including regular physical activity/exercise, weight management, moderate sodium restriction and increased consumption of fresh fruit, vegetables, and low fat dairy, alcohol moderation, and smoking cessation.;Monitor prescription use compliance.    Expected Outcomes  Short Term: Continued assessment and intervention until BP is < 140/29mm HG in hypertensive participants. < 130/3mm HG in hypertensive participants with diabetes, heart failure or chronic kidney disease.;Long Term: Maintenance of blood pressure at goal levels.    Lipids  Yes    Intervention  Provide education and support for participant on nutrition & aerobic/resistive exercise along with prescribed medications to achieve LDL 70mg , HDL >40mg .    Expected Outcomes  Short Term: Participant states understanding of desired cholesterol values and is compliant with medications prescribed. Participant is following exercise prescription and nutrition guidelines.;Long Term: Cholesterol controlled with medications as prescribed, with  individualized exercise RX and with personalized nutrition plan. Value goals: LDL < 70mg , HDL > 40 mg.    Stress  Yes    Intervention  Offer individual and/or small group education and counseling on adjustment to heart disease, stress management and health-related lifestyle change. Teach and support self-help strategies.;Refer participants experiencing significant psychosocial distress to appropriate mental health specialists for further evaluation and treatment. When possible, include family members and significant others in education/counseling sessions.    Expected Outcomes  Short Term: Participant demonstrates changes in health-related behavior, relaxation and other stress management skills, ability to obtain effective social support, and compliance with psychotropic medications if prescribed.;Long Term: Emotional wellbeing is indicated by absence of clinically significant psychosocial distress or social isolation.       Core Components/Risk Factors/Patient Goals Review:  Goals and Risk Factor Review    Row Name 02/06/20 1641             Core Components/Risk Factors/Patient Goals Review   Personal Goals Review  Weight Management/Obesity;Lipids;Hypertension;Stress       Review  Kendl has remained in [facility] since her cardioversion so far. Telsa has been doing well with exercise.       Expected Outcomes  Patient will continue to partcipate in phase 2 cardiac rehab for exercise, nutrtion and lifestyle modifications          Core Components/Risk Factors/Patient Goals at Discharge (Final Review):  Goals and Risk Factor Review - 02/06/20 1641      Core Components/Risk Factors/Patient Goals Review   Personal Goals Review  Weight Management/Obesity;Lipids;Hypertension;Stress    Review  Danyel has remained in [facility] since her cardioversion so far. Somer has been doing well with exercise.  Expected Outcomes  Patient will continue to partcipate in phase 2 cardiac rehab for  exercise, nutrtion and lifestyle modifications       ITP Comments: ITP Comments    Row Name 01/10/20 1028 02/06/20 1634         ITP Comments  Dr. Fransico Him, Medical Director  30 Day ITP Review. Patient has good attendance and participation in phase 2 cardiac rehab. Yaritzy has remained in [facility] since her cardioversion         Comments: See ITP comments.Barnet Pall, RN,BSN 02/07/2020 12:23 PM

## 2020-02-08 ENCOUNTER — Encounter (HOSPITAL_COMMUNITY)
Admission: RE | Admit: 2020-02-08 | Discharge: 2020-02-08 | Disposition: A | Discharge status: Home / Self Care | Payer: Medicare Other | Source: Ambulatory Visit | Attending: Cardiology | Admitting: Cardiology

## 2020-02-08 ENCOUNTER — Other Ambulatory Visit: Payer: Self-pay

## 2020-02-08 DIAGNOSIS — I214 Non-ST elevation (NSTEMI) myocardial infarction: Secondary | ICD-10-CM

## 2020-02-08 DIAGNOSIS — Z955 Presence of coronary angioplasty implant and graft: Secondary | ICD-10-CM

## 2020-02-08 DIAGNOSIS — I252 Old myocardial infarction: Secondary | ICD-10-CM | POA: Diagnosis not present

## 2020-02-11 ENCOUNTER — Other Ambulatory Visit: Payer: Self-pay

## 2020-02-11 ENCOUNTER — Encounter (HOSPITAL_COMMUNITY)
Admission: RE | Admit: 2020-02-11 | Discharge: 2020-02-11 | Disposition: A | Discharge status: Home / Self Care | Payer: Medicare Other | Source: Ambulatory Visit | Attending: Cardiology | Admitting: Cardiology

## 2020-02-11 DIAGNOSIS — I214 Non-ST elevation (NSTEMI) myocardial infarction: Secondary | ICD-10-CM

## 2020-02-11 DIAGNOSIS — I252 Old myocardial infarction: Secondary | ICD-10-CM | POA: Diagnosis not present

## 2020-02-11 DIAGNOSIS — Z955 Presence of coronary angioplasty implant and graft: Secondary | ICD-10-CM

## 2020-02-13 ENCOUNTER — Encounter (HOSPITAL_COMMUNITY)
Admission: RE | Admit: 2020-02-13 | Discharge: 2020-02-13 | Disposition: A | Discharge status: Home / Self Care | Payer: Medicare Other | Source: Ambulatory Visit | Attending: Cardiology | Admitting: Cardiology

## 2020-02-13 ENCOUNTER — Encounter (HOSPITAL_COMMUNITY): Payer: Medicare Other

## 2020-02-13 ENCOUNTER — Other Ambulatory Visit: Payer: Self-pay

## 2020-02-13 DIAGNOSIS — I252 Old myocardial infarction: Secondary | ICD-10-CM | POA: Diagnosis not present

## 2020-02-13 DIAGNOSIS — Z955 Presence of coronary angioplasty implant and graft: Secondary | ICD-10-CM

## 2020-02-13 DIAGNOSIS — I214 Non-ST elevation (NSTEMI) myocardial infarction: Secondary | ICD-10-CM

## 2020-02-15 ENCOUNTER — Encounter (HOSPITAL_COMMUNITY): Payer: Medicare Other

## 2020-02-15 ENCOUNTER — Telehealth (HOSPITAL_COMMUNITY): Payer: Self-pay | Admitting: Family Medicine

## 2020-02-18 ENCOUNTER — Other Ambulatory Visit: Payer: Self-pay

## 2020-02-18 ENCOUNTER — Encounter (HOSPITAL_COMMUNITY)
Admission: RE | Admit: 2020-02-18 | Discharge: 2020-02-18 | Disposition: A | Discharge status: Home / Self Care | Payer: Medicare Other | Source: Ambulatory Visit | Attending: Cardiology | Admitting: Cardiology

## 2020-02-18 DIAGNOSIS — I214 Non-ST elevation (NSTEMI) myocardial infarction: Secondary | ICD-10-CM

## 2020-02-18 DIAGNOSIS — I252 Old myocardial infarction: Secondary | ICD-10-CM | POA: Diagnosis not present

## 2020-02-18 DIAGNOSIS — Z955 Presence of coronary angioplasty implant and graft: Secondary | ICD-10-CM

## 2020-02-20 ENCOUNTER — Other Ambulatory Visit: Payer: Self-pay

## 2020-02-20 ENCOUNTER — Encounter (HOSPITAL_COMMUNITY): Payer: Medicare Other

## 2020-02-20 ENCOUNTER — Encounter (HOSPITAL_COMMUNITY)
Admission: RE | Admit: 2020-02-20 | Discharge: 2020-02-20 | Disposition: A | Discharge status: Home / Self Care | Payer: Medicare Other | Source: Ambulatory Visit | Attending: Cardiology | Admitting: Cardiology

## 2020-02-20 DIAGNOSIS — I214 Non-ST elevation (NSTEMI) myocardial infarction: Secondary | ICD-10-CM

## 2020-02-20 DIAGNOSIS — Z955 Presence of coronary angioplasty implant and graft: Secondary | ICD-10-CM

## 2020-02-20 DIAGNOSIS — I252 Old myocardial infarction: Secondary | ICD-10-CM | POA: Diagnosis not present

## 2020-02-22 ENCOUNTER — Other Ambulatory Visit: Payer: Self-pay

## 2020-02-22 ENCOUNTER — Encounter (HOSPITAL_COMMUNITY)
Admission: RE | Admit: 2020-02-22 | Discharge: 2020-02-22 | Disposition: A | Discharge status: Home / Self Care | Payer: Medicare Other | Source: Ambulatory Visit | Attending: Cardiology | Admitting: Cardiology

## 2020-02-22 DIAGNOSIS — Z955 Presence of coronary angioplasty implant and graft: Secondary | ICD-10-CM

## 2020-02-22 DIAGNOSIS — I252 Old myocardial infarction: Secondary | ICD-10-CM | POA: Diagnosis not present

## 2020-02-22 DIAGNOSIS — I214 Non-ST elevation (NSTEMI) myocardial infarction: Secondary | ICD-10-CM

## 2020-02-25 ENCOUNTER — Other Ambulatory Visit: Payer: Self-pay

## 2020-02-25 ENCOUNTER — Encounter (HOSPITAL_COMMUNITY)
Admission: RE | Admit: 2020-02-25 | Discharge: 2020-02-25 | Disposition: A | Discharge status: Home / Self Care | Payer: Medicare Other | Source: Ambulatory Visit | Attending: Cardiology | Admitting: Cardiology

## 2020-02-25 DIAGNOSIS — I252 Old myocardial infarction: Secondary | ICD-10-CM | POA: Diagnosis not present

## 2020-02-25 DIAGNOSIS — Z955 Presence of coronary angioplasty implant and graft: Secondary | ICD-10-CM

## 2020-02-25 DIAGNOSIS — I214 Non-ST elevation (NSTEMI) myocardial infarction: Secondary | ICD-10-CM

## 2020-02-27 ENCOUNTER — Encounter (HOSPITAL_COMMUNITY)
Admission: RE | Admit: 2020-02-27 | Discharge: 2020-02-27 | Disposition: A | Discharge status: Home / Self Care | Payer: Medicare Other | Source: Ambulatory Visit | Attending: Cardiology | Admitting: Cardiology

## 2020-02-27 ENCOUNTER — Encounter (HOSPITAL_COMMUNITY): Payer: Medicare Other

## 2020-02-27 ENCOUNTER — Other Ambulatory Visit: Payer: Self-pay

## 2020-02-27 DIAGNOSIS — I252 Old myocardial infarction: Secondary | ICD-10-CM | POA: Diagnosis not present

## 2020-02-27 DIAGNOSIS — Z955 Presence of coronary angioplasty implant and graft: Secondary | ICD-10-CM

## 2020-02-27 DIAGNOSIS — I214 Non-ST elevation (NSTEMI) myocardial infarction: Secondary | ICD-10-CM

## 2020-02-29 ENCOUNTER — Other Ambulatory Visit: Payer: Self-pay

## 2020-02-29 ENCOUNTER — Encounter (HOSPITAL_COMMUNITY)
Admission: RE | Admit: 2020-02-29 | Discharge: 2020-02-29 | Disposition: A | Discharge status: Home / Self Care | Payer: Medicare Other | Source: Ambulatory Visit | Attending: Cardiology | Admitting: Cardiology

## 2020-02-29 DIAGNOSIS — I252 Old myocardial infarction: Secondary | ICD-10-CM | POA: Diagnosis not present

## 2020-02-29 DIAGNOSIS — Z955 Presence of coronary angioplasty implant and graft: Secondary | ICD-10-CM

## 2020-02-29 DIAGNOSIS — I214 Non-ST elevation (NSTEMI) myocardial infarction: Secondary | ICD-10-CM

## 2020-03-05 ENCOUNTER — Other Ambulatory Visit: Payer: Self-pay

## 2020-03-05 ENCOUNTER — Encounter (HOSPITAL_COMMUNITY): Payer: Medicare Other

## 2020-03-05 ENCOUNTER — Encounter (HOSPITAL_COMMUNITY)
Admission: RE | Admit: 2020-03-05 | Discharge: 2020-03-05 | Disposition: A | Discharge status: Home / Self Care | Payer: Medicare Other | Source: Ambulatory Visit | Attending: Cardiology | Admitting: Cardiology

## 2020-03-05 DIAGNOSIS — I252 Old myocardial infarction: Secondary | ICD-10-CM | POA: Diagnosis present

## 2020-03-05 DIAGNOSIS — N183 Chronic kidney disease, stage 3 unspecified: Secondary | ICD-10-CM | POA: Diagnosis not present

## 2020-03-05 DIAGNOSIS — Z96641 Presence of right artificial hip joint: Secondary | ICD-10-CM | POA: Insufficient documentation

## 2020-03-05 DIAGNOSIS — I4819 Other persistent atrial fibrillation: Secondary | ICD-10-CM | POA: Insufficient documentation

## 2020-03-05 DIAGNOSIS — Z7901 Long term (current) use of anticoagulants: Secondary | ICD-10-CM | POA: Diagnosis not present

## 2020-03-05 DIAGNOSIS — E785 Hyperlipidemia, unspecified: Secondary | ICD-10-CM | POA: Insufficient documentation

## 2020-03-05 DIAGNOSIS — I129 Hypertensive chronic kidney disease with stage 1 through stage 4 chronic kidney disease, or unspecified chronic kidney disease: Secondary | ICD-10-CM | POA: Insufficient documentation

## 2020-03-05 DIAGNOSIS — Z7902 Long term (current) use of antithrombotics/antiplatelets: Secondary | ICD-10-CM | POA: Diagnosis not present

## 2020-03-05 DIAGNOSIS — Z79899 Other long term (current) drug therapy: Secondary | ICD-10-CM | POA: Diagnosis not present

## 2020-03-05 DIAGNOSIS — I214 Non-ST elevation (NSTEMI) myocardial infarction: Secondary | ICD-10-CM

## 2020-03-05 DIAGNOSIS — Z955 Presence of coronary angioplasty implant and graft: Secondary | ICD-10-CM

## 2020-03-06 NOTE — Progress Notes (Signed)
Cardiac Individual Treatment Plan  Patient Details  Name: Melissa Clements MRN: [id number] Date of Birth: 03/15/1946 Referring Provider:     CARDIAC REHAB PHASE II ORIENTATION from 01/10/2020 in [place]  Referring Provider  Park Liter MD      Initial Encounter Date:    CARDIAC REHAB PHASE II ORIENTATION from 01/10/2020 in [place]  Date  01/10/20      Visit Diagnosis: NSTEMI (non-ST elevated myocardial infarction) ([place]) 11/01/19  Status post coronary artery stent placement S/P DES LAD 11/02/19  Patient's Home Medications on Admission:  Current Outpatient Medications:  .  atorvastatin (LIPITOR) 80 MG tablet, Take 1 tablet (80 mg total) by mouth daily at 6 PM., Disp: 90 tablet, Rfl: 3 .  Cholecalciferol (VITAMIN D) 2000 units tablet, Take 2,000 Units by mouth every other day. , Disp: , Rfl:  .  clopidogrel (PLAVIX) 75 MG tablet, Take 1 tablet (75 mg total) by mouth daily with breakfast., Disp: 90 tablet, Rfl: 3 .  Collagen-Boron-Hyaluronic Acid (MOVE FREE ULTRA JOINT HEALTH PO), Take 2 tablets by mouth daily. , Disp: , Rfl:  .  docusate sodium (COLACE) 100 MG capsule, Take 100 mg by mouth daily., Disp: , Rfl:  .  lisinopril (ZESTRIL) 2.5 MG tablet, Take 1 tablet (2.5 mg total) by mouth daily., Disp: 90 tablet, Rfl: 3 .  metoprolol succinate (TOPROL-XL) 50 MG 24 hr tablet, Take 0.5 tablets (25 mg total) by mouth daily. Hold if heart rate is <60 bpm, Disp: 90 tablet, Rfl: 1 .  nitroGLYCERIN (NITROSTAT) 0.4 MG SL tablet, Place 1 tablet (0.4 mg total) under the tongue every 5 (five) minutes x 3 doses as needed for chest pain., Disp: 25 tablet, Rfl: 3 .  oxymetazoline (AFRIN) 0.05 % nasal spray, Place 1 spray into both nostrils daily as needed (nose bleeds)., Disp: , Rfl:  .  rivaroxaban (XARELTO) 20 MG TABS tablet, Take 1 tablet (20 mg total) by mouth daily with supper., Disp: 30 tablet, Rfl: 12  Past Medical  History: Past Medical History:  Diagnosis Date  . Arthritis    end stage right hip  . Basal cell carcinoma   . Benign essential HTN 01/23/2015  . CAD S/P percutaneous coronary angioplasty 11/06/2019   LAD PCI with DES 11/02/2019  . Chronic kidney disease    stage III, related to high blood pressure medication  . Colon polyp   . CRI (chronic renal insufficiency), stage 3 (moderate) 11/06/2019   No ACE or ARB  . Depression   . Dizziness 01/23/2015  . Dyslipidemia, goal LDL below 70 11/06/2019   LDL 84- changed from Zocor to Lipitor 80 mg  . Dyspnea on exertion 11/01/2019  . Hematoma 11/06/2019   Hematoma and ecchymosis Rt radial site post PCI-(anticoagulation for new AF held).   . History of chest pain   . History of dizziness   . History of palpitations   . Hyperlipidemia   . Hypertension   . Ischemic cardiomyopathy 11/06/2019   EF 45%  . NSTEMI (non-ST elevated myocardial infarction) ([place]) 11/01/2019   S/P NSTEMI 11/03/2019  . Obese 01/25/2018  . Palpitations 01/23/2015  . Persistent atrial fibrillation ([place])    New diagnosis 11/03/2019- not anticoagulated yet- minimal symptoms- rate controlled  . Precordial chest pain 01/23/2015   Negative stress test in the spring 2019  . S/P right THA, AA 01/24/2018  . Sinus bradycardia 01/20/2018    Tobacco Use: Social History  Tobacco Use  Smoking Status Never Smoker  Smokeless Tobacco Never Used    Labs: Recent Review Flowsheet Data    Labs for ITP Cardiac and Pulmonary Rehab Latest Ref Rng & Units 11/01/2019 11/02/2019 12/24/2019   Cholestrol 100 - 199 mg/dL 181 153 132   LDLCALC 0 - 99 mg/dL NOT CALCULATED 84 57   HDL >39 mg/dL 66 58 57   Trlycerides 0 - 149 mg/dL 78 55 97   Hemoglobin A1c 4.8 - 5.6 % 5.5 - -      Capillary Blood Glucose: No results found for: GLUCAP   Exercise Target Goals: Exercise Program Goal: Individual exercise prescription set using results from initial 6 min walk test and THRR while considering  patient's  activity barriers and safety.   Exercise Prescription Goal: Starting with aerobic activity 30 plus minutes a day, 3 days per week for initial exercise prescription. Provide home exercise prescription and guidelines that participant acknowledges understanding prior to discharge.  Activity Barriers & Risk Stratification: Activity Barriers & Cardiac Risk Stratification - 01/10/20 1135      Activity Barriers & Cardiac Risk Stratification   Activity Barriers  Right Hip Replacement    Cardiac Risk Stratification  High       6 Minute Walk: 6 Minute Walk    Row Name 01/10/20 1129 01/10/20 1139       6 Minute Walk   Phase  Initial  --    Distance  739 feet  --    Walk Time  6 minutes 4 minutes total walking  --    # of Rest Breaks  1  --    MPH  1.4  --    METS  1.34  --    RPE  12  --    Perceived Dyspnea   2  --    VO2 Peak  4.7  --    Symptoms  No  Yes (comment)    Comments  --  Pt took 2 minute rest break due to SOB +2. Pt also reported 2/10 right hip pain    Resting HR  98 bpm  --    Resting BP  114/80  --    Resting Oxygen Saturation   98 %  --    Exercise Oxygen Saturation  during 6 min walk  98 %  --    Max Ex. HR  129 bpm  --    Max Ex. BP  142/86  --    2 Minute Post BP  120/76  --       Oxygen Initial Assessment:   Oxygen Re-Evaluation:   Oxygen Discharge (Final Oxygen Re-Evaluation):   Initial Exercise Prescription: Initial Exercise Prescription - 01/10/20 1100      Date of Initial Exercise RX and Referring Provider   Date  01/10/20    Referring Provider  Park Liter MD    Expected Discharge Date  03/07/20      NuStep   Level  2    SPM  85    Minutes  15    METs  2      Arm Ergometer   Level  1.5    Watts  15    Minutes  15    METs  2      Prescription Details   Frequency (times per week)  3x    Duration  Progress to 30 minutes of continuous aerobic without signs/symptoms of physical distress      Intensity  THRR 40-80% of Max  Heartrate  58-112    Ratings of Perceived Exertion  11-13    Perceived Dyspnea  0-4      Progression   Progression  Continue progressive overload as per policy without signs/symptoms or physical distress.      Resistance Training   Training Prescription  Yes    Weight  2lbs    Reps  10-15       Perform Capillary Blood Glucose checks as needed.  Exercise Prescription Changes: Exercise Prescription Changes    Row Name 01/15/20 0900 01/25/20 1406 02/13/20 0725 02/27/20 0724       Response to Exercise   Blood Pressure (Admit)  118/88  118/68  120/70  102/60    Blood Pressure (Exercise)  124/72  122/68  144/80  140/80    Blood Pressure (Exit)  114/72  108/70  130/72  124/70    Heart Rate (Admit)  100 bpm  92 bpm  76 bpm  78 bpm    Heart Rate (Exercise)  99 bpm  98 bpm  97 bpm  87 bpm    Heart Rate (Exit)  91 bpm  92 bpm  71 bpm  76 bpm    Rating of Perceived Exertion (Exercise)  13  11  12  11     Perceived Dyspnea (Exercise)  --  0  0  0    Symptoms  --  None  None  None    Comments  --  Pt tolerated exercise well  None  Pt tolerated exercise well    Duration  Continue with 30 min of aerobic exercise without signs/symptoms of physical distress.  Continue with 30 min of aerobic exercise without signs/symptoms of physical distress.  Continue with 30 min of aerobic exercise without signs/symptoms of physical distress.  Continue with 30 min of aerobic exercise without signs/symptoms of physical distress.    Intensity  THRR unchanged  THRR unchanged  THRR unchanged  THRR unchanged      Progression   Progression  Continue to progress workloads to maintain intensity without signs/symptoms of physical distress.  Continue to progress workloads to maintain intensity without signs/symptoms of physical distress.  Continue to progress workloads to maintain intensity without signs/symptoms of physical distress.  Continue to progress workloads to maintain intensity without signs/symptoms of physical  distress.    Average METs  --  2.1  2.3  3      Resistance Training   Training Prescription  Yes  Yes  Yes  No    Weight  2 lbs  2 lbs  2 lbs  --    Reps  10-15  10-15  10-15  --    Time  --  10 Minutes  10 Minutes  --      NuStep   Level  2  2  3  2     SPM  85  85  90  85    Minutes  15  15  15  30     METs  1.7  1.7  2.3  1.7      Arm Ergometer   Level  15  15  15   --    Watts  --  --  5  --    Minutes  15  15  15   --    METs  --  2.1  1.9  --      Home Exercise Plan   Plans to continue exercise at  --  --  Home (comment) Walking  Home (comment) Walking    Frequency  --  --  Add 2 additional days to program exercise sessions.  Add 2 additional days to program exercise sessions.    Initial Home Exercises Provided  --  --  02/13/20  02/13/20       Exercise Comments: Exercise Comments    Row Name 01/15/20 [id number] 02/07/20 0856 03/06/20 1354       Exercise Comments  Pt completed her first exercise session in cardiac rehab. She tolerated exercise well with no complaints.  Pt is tolerating exercise prescription well. Will follow up with pt regarding exercise at home. Will continue to progress workloads as tolerated.  Pt is continuing to respond well to workloads. Pt will be graduating rehab soon, will follow up with pt regarding exercise plans post rehab. Will continue to monitor.        Exercise Goals and Review: Exercise Goals    Row Name 01/10/20 1135             Exercise Goals   Increase Physical Activity  Yes       Intervention  Provide advice, education, support and counseling about physical activity/exercise needs.;Develop an individualized exercise prescription for aerobic and resistive training based on initial evaluation findings, risk stratification, comorbidities and participant's personal goals.       Expected Outcomes  Short Term: Attend rehab on a regular basis to increase amount of physical activity.;Long Term: Add in home exercise to make exercise part of  routine and to increase amount of physical activity.;Long Term: Exercising regularly at least 3-5 days a week.       Increase Strength and Stamina  Yes       Intervention  Provide advice, education, support and counseling about physical activity/exercise needs.;Develop an individualized exercise prescription for aerobic and resistive training based on initial evaluation findings, risk stratification, comorbidities and participant's personal goals.       Expected Outcomes  Short Term: Increase workloads from initial exercise prescription for resistance, speed, and METs.;Short Term: Perform resistance training exercises routinely during rehab and add in resistance training at home;Long Term: Improve cardiorespiratory fitness, muscular endurance and strength as measured by increased METs and functional capacity (6MWT)       Able to understand and use rate of perceived exertion (RPE) scale  Yes       Intervention  Provide education and explanation on how to use RPE scale       Expected Outcomes  Short Term: Able to use RPE daily in rehab to express subjective intensity level;Long Term:  Able to use RPE to guide intensity level when exercising independently       Knowledge and understanding of Target Heart Rate Range (THRR)  Yes       Intervention  Provide education and explanation of THRR including how the numbers were predicted and where they are located for reference       Expected Outcomes  Short Term: Able to state/look up THRR;Long Term: Able to use THRR to govern intensity when exercising independently;Short Term: Able to use daily as guideline for intensity in rehab       Able to check pulse independently  Yes       Intervention  Provide education and demonstration on how to check pulse in carotid and radial arteries.;Review the importance of being able to check your own pulse for safety during independent exercise       Expected Outcomes  Short Term: Able to  explain why pulse checking is important  during independent exercise;Long Term: Able to check pulse independently and accurately       Understanding of Exercise Prescription  Yes       Intervention  Provide education, explanation, and written materials on patient's individual exercise prescription       Expected Outcomes  Short Term: Able to explain program exercise prescription;Long Term: Able to explain home exercise prescription to exercise independently          Exercise Goals Re-Evaluation : Exercise Goals Re-Evaluation    [place] Name 03/06/20 1359             Exercise Goal Re-Evaluation   Exercise Goals Review  Increase Strength and Stamina;Able to understand and use rate of perceived exertion (RPE) scale;Knowledge and understanding of Target Heart Rate Range (THRR);Understanding of Exercise Prescription       Comments  Pt continues to put forth good effort with exercise. Will continue to monitor.       Expected Outcomes  Pt will continue to increase cardiovascular function.           Discharge Exercise Prescription (Final Exercise Prescription Changes): Exercise Prescription Changes - 02/27/20 0724      Response to Exercise   Blood Pressure (Admit)  102/60    Blood Pressure (Exercise)  140/80    Blood Pressure (Exit)  124/70    Heart Rate (Admit)  78 bpm    Heart Rate (Exercise)  87 bpm    Heart Rate (Exit)  76 bpm    Rating of Perceived Exertion (Exercise)  11    Perceived Dyspnea (Exercise)  0    Symptoms  None    Comments  Pt tolerated exercise well    Duration  Continue with 30 min of aerobic exercise without signs/symptoms of physical distress.    Intensity  THRR unchanged      Progression   Progression  Continue to progress workloads to maintain intensity without signs/symptoms of physical distress.    Average METs  3      Resistance Training   Training Prescription  No      NuStep   Level  2    SPM  85    Minutes  30    METs  1.7      Home Exercise Plan   Plans to continue exercise at  Home  (comment)   Walking   Frequency  Add 2 additional days to program exercise sessions.    Initial Home Exercises Provided  02/13/20       Nutrition:  Target Goals: Understanding of nutrition guidelines, daily intake of sodium 1500mg , cholesterol 200mg , calories 30% from fat and 7% or less from saturated fats, daily to have 5 or more servings of fruits and vegetables.  Biometrics: Pre Biometrics - 01/10/20 1134      Pre Biometrics   Height  5' 3.25" (1.607 m)    Weight  105.5 kg    Waist Circumference  48 inches    Hip Circumference  55 inches    Waist to Hip Ratio  0.87 %    BMI (Calculated)  40.85    Triceps Skinfold  33 mm    % Body Fat  52.3 %    Grip Strength  26 kg    Flexibility  15.5 in    Single Leg Stand  15 seconds        Nutrition Therapy Plan and Nutrition Goals: Nutrition Therapy & Goals - 01/16/20 1447  Nutrition Therapy   Diet  Heart healthy      Personal Nutrition Goals   Nutrition Goal  Pt to identify food quantities necessary to achieve weight loss of 6-24 lb at graduation from cardiac rehab.    Personal Goal #2  Pt to build a healthy plate including vegetables, fruits, whole grains, and low-fat dairy products in a heart healthy meal plan.      Intervention Plan   Intervention  Nutrition handout(s) given to patient.;Prescribe, educate and counsel regarding individualized specific dietary modifications aiming towards targeted core components such as weight, hypertension, lipid management, diabetes, heart failure and other comorbidities.    Expected Outcomes  Short Term Goal: A plan has been developed with personal nutrition goals set during dietitian appointment.;Long Term Goal: Adherence to prescribed nutrition plan.       Nutrition Assessments: Nutrition Assessments - 01/14/20 1329      MEDFICTS Scores   Pre Score  24       Nutrition Goals Re-Evaluation: Nutrition Goals Re-Evaluation    [place] Name 01/16/20 1447 03/06/20 0707            Goals   Current Weight  232 lb (105.2 kg)  226 lb (102.5 kg)      Nutrition Goal  --  Pt to identify food quantities necessary to achieve weight loss of 6-24 lb at graduation from cardiac rehab.      Comment  --  Pt has lost 6 lbs since start of cardiac rehab      Expected Outcome  Weight loss and increased physical activity  Weight loss and increased physical activity        Personal Goal #2 Re-Evaluation   Personal Goal #2  --  Pt to build a healthy plate including vegetables, fruits, whole grains, and low-fat dairy products in a heart healthy meal plan.         Nutrition Goals Discharge (Final Nutrition Goals Re-Evaluation): Nutrition Goals Re-Evaluation - 03/06/20 0707      Goals   Current Weight  226 lb (102.5 kg)    Nutrition Goal  Pt to identify food quantities necessary to achieve weight loss of 6-24 lb at graduation from cardiac rehab.    Comment  Pt has lost 6 lbs since start of cardiac rehab    Expected Outcome  Weight loss and increased physical activity      Personal Goal #2 Re-Evaluation   Personal Goal #2  Pt to build a healthy plate including vegetables, fruits, whole grains, and low-fat dairy products in a heart healthy meal plan.       Psychosocial: Target Goals: Acknowledge presence or absence of significant depression and/or stress, maximize coping skills, provide positive support system. Participant is able to verbalize types and ability to use techniques and skills needed for reducing stress and depression.  Initial Review & Psychosocial Screening: Initial Psych Review & Screening - 01/10/20 1255      Initial Review   Current issues with  Current Depression;Current Stress Concerns    Source of Stress Concerns  Chronic Illness;Family;Unable to perform yard/household activities    Comments  Melissa Clements reports that she has been depressed due to family issues and decreased fitness level. Melissa Clements does have home stressors with an adult daughter living at home       [place]?  Yes   Melissa Clements has her husband for support   Comments  Melissa Clements was offered to meet with a hospital counselor. Adriannah declined  at this time      Barriers   Psychosocial barriers to participate in program  The patient should benefit from training in stress management and relaxation.      Screening Interventions   Interventions  Encouraged to exercise;Provide feedback about the scores to participant;To provide support and resources with identified psychosocial needs    Expected Outcomes  Long Term Goal: Stressors or current issues are controlled or eliminated.;Short Term goal: Identification and review with participant of any Quality of Life or Depression concerns found by scoring the questionnaire.;Long Term goal: The participant improves quality of Life and PHQ9 Scores as seen by post scores and/or verbalization of changes       Quality of Life Scores: Quality of Life - 01/10/20 1124      Quality of Life   Select  Quality of Life      Quality of Life Scores   Health/Function Pre  16.54 %    Socioeconomic Pre  21.17 %    Psych/Spiritual Pre  17.5 %    Family Pre  14.8 %    GLOBAL Pre  17.34 %      Scores of 19 and below usually indicate a poorer quality of life in these areas.  A difference of  2-3 points is a clinically meaningful difference.  A difference of 2-3 points in the total score of the Quality of Life Index has been associated with significant improvement in overall quality of life, self-image, physical symptoms, and general health in studies assessing change in quality of life.  PHQ-9: Recent Review Flowsheet Data    Depression screen [facility] 2/9 01/10/2020   Decreased Interest 0    Down, Depressed, Hopeless 1   PHQ - 2 Score 1     Interpretation of Total Score  Total Score Depression Severity:  1-4 = Minimal depression, 5-9 = Mild depression, 10-14 = Moderate depression, 15-19 = Moderately severe depression, 20-27 = Severe depression    Psychosocial Evaluation and Intervention:   Psychosocial Re-Evaluation: Psychosocial Re-Evaluation    [place] Name 02/06/20 1638 03/06/20 1510           Psychosocial Re-Evaluation   Current issues with  Current Stress Concerns  Current Stress Concerns      Comments  Melissa Clements has not mentioned any increased stressors  Melissa Clements has not mentioned any increased stressors.      Expected Outcomes  Will offer emotional support as needed  Will offer emotional support as needed      Interventions  Encouraged to attend Cardiac Rehabilitation for the exercise;Stress management education  Encouraged to attend Cardiac Rehabilitation for the exercise;Stress management education      Comments  Melissa Clements reports that she has been depressed due to family issues and decreased fitness level. Kristyl does have home stressors with an adult daughter living at home  Melissa Clements reports that she has been depressed due to family issues and decreased fitness level. Keyonta's daughter recently moved out.        Initial Review   Source of Stress Concerns  Chronic Illness;Family;Unable to perform yard/household activities  Chronic Illness;Family;Unable to perform yard/household activities         Psychosocial Discharge (Final Psychosocial Re-Evaluation): Psychosocial Re-Evaluation - 03/06/20 1510      Psychosocial Re-Evaluation   Current issues with  Current Stress Concerns    Comments  Melissa Clements has not mentioned any increased stressors.    Expected Outcomes  Will offer emotional support as needed    Interventions  Encouraged  to attend Cardiac Rehabilitation for the exercise;Stress management education    Comments  Melissa Clements reports that she has been depressed due to family issues and decreased fitness level. Melissa Clements's daughter recently moved out.      Initial Review   Source of Stress Concerns  Chronic Illness;Family;Unable to perform yard/household activities       Vocational Rehabilitation: Provide vocational rehab  assistance to qualifying candidates.   Vocational Rehab Evaluation & Intervention: Vocational Rehab - 01/10/20 1125      Initial Vocational Rehab Evaluation & Intervention   Assessment shows need for Vocational Rehabilitation  No       Education: Education Goals: Education classes will be provided on a weekly basis, covering required topics. Participant will state understanding/return demonstration of topics presented.  Learning Barriers/Preferences: Learning Barriers/Preferences - 01/10/20 1140      Learning Barriers/Preferences   Learning Barriers  None    Learning Preferences  Written Material;Verbal Instruction;Skilled Demonstration       Education Topics: Hypertension, Hypertension Reduction -Define heart disease and high blood pressure. Discus how high blood pressure affects the body and ways to reduce high blood pressure.   Exercise and Your Heart -Discuss why it is important to exercise, the FITT principles of exercise, normal and abnormal responses to exercise, and how to exercise safely.   Angina -Discuss definition of angina, causes of angina, treatment of angina, and how to decrease risk of having angina.   Cardiac Medications -Review what the following cardiac medications are used for, how they affect the body, and side effects that may occur when taking the medications.  Medications include Aspirin, Beta blockers, calcium channel blockers, ACE Inhibitors, angiotensin receptor blockers, diuretics, digoxin, and antihyperlipidemics.   Congestive Heart Failure -Discuss the definition of CHF, how to live with CHF, the signs and symptoms of CHF, and how keep track of weight and sodium intake.   Heart Disease and Intimacy -Discus the effect sexual activity has on the heart, how changes occur during intimacy as we age, and safety during sexual activity.   Smoking Cessation / COPD -Discuss different methods to quit smoking, the health benefits of quitting smoking,  and the definition of COPD.   Nutrition I: Fats -Discuss the types of cholesterol, what cholesterol does to the heart, and how cholesterol levels can be controlled.   Nutrition II: Labels -Discuss the different components of food labels and how to read food label   Heart Parts/Heart Disease and PAD -Discuss the anatomy of the heart, the pathway of blood circulation through the heart, and these are affected by heart disease.   Stress I: Signs and Symptoms -Discuss the causes of stress, how stress may lead to anxiety and depression, and ways to limit stress.   Stress II: Relaxation -Discuss different types of relaxation techniques to limit stress.   Warning Signs of Stroke / TIA -Discuss definition of a stroke, what the signs and symptoms are of a stroke, and how to identify when someone is having stroke.   Knowledge Questionnaire Score: Knowledge Questionnaire Score - 01/10/20 1124      Knowledge Questionnaire Score   Pre Score  20/24       Core Components/Risk Factors/Patient Goals at Admission: Personal Goals and Risk Factors at Admission - 01/10/20 1300      Core Components/Risk Factors/Patient Goals on Admission    Weight Management  Yes;Obesity;Weight Maintenance;Weight Loss    Intervention  Weight Management: Develop a combined nutrition and exercise program designed to reach desired caloric intake,  while maintaining appropriate intake of nutrient and fiber, sodium and fats, and appropriate energy expenditure required for the weight goal.;Weight Management: Provide education and appropriate resources to help participant work on and attain dietary goals.;Weight Management/Obesity: Establish reasonable short term and long term weight goals.;Obesity: Provide education and appropriate resources to help participant work on and attain dietary goals.    Admit Weight  232 lb 9.4 oz (105.5 kg)    Hypertension  Yes    Intervention  Provide education on lifestyle modifcations  including regular physical activity/exercise, weight management, moderate sodium restriction and increased consumption of fresh fruit, vegetables, and low fat dairy, alcohol moderation, and smoking cessation.;Monitor prescription use compliance.    Expected Outcomes  Short Term: Continued assessment and intervention until BP is < 140/39mm HG in hypertensive participants. < 130/67mm HG in hypertensive participants with diabetes, heart failure or chronic kidney disease.;Long Term: Maintenance of blood pressure at goal levels.    Lipids  Yes    Intervention  Provide education and support for participant on nutrition & aerobic/resistive exercise along with prescribed medications to achieve LDL 70mg , HDL >40mg .    Expected Outcomes  Short Term: Participant states understanding of desired cholesterol values and is compliant with medications prescribed. Participant is following exercise prescription and nutrition guidelines.;Long Term: Cholesterol controlled with medications as prescribed, with individualized exercise RX and with personalized nutrition plan. Value goals: LDL < 70mg , HDL > 40 mg.    Stress  Yes    Intervention  Offer individual and/or small group education and counseling on adjustment to heart disease, stress management and health-related lifestyle change. Teach and support self-help strategies.;Refer participants experiencing significant psychosocial distress to appropriate mental health specialists for further evaluation and treatment. When possible, include family members and significant others in education/counseling sessions.    Expected Outcomes  Short Term: Participant demonstrates changes in health-related behavior, relaxation and other stress management skills, ability to obtain effective social support, and compliance with psychotropic medications if prescribed.;Long Term: Emotional wellbeing is indicated by absence of clinically significant psychosocial distress or social isolation.        Core Components/Risk Factors/Patient Goals Review:  Goals and Risk Factor Review    Row Name 02/06/20 1641 03/06/20 1513           Core Components/Risk Factors/Patient Goals Review   Personal Goals Review  Weight Management/Obesity;Lipids;Hypertension;Stress  Weight Management/Obesity;Lipids;Hypertension;Stress      Review  Melissa Clements has remained in Sinus Rhythm since her cardioversion so far. Melissa Clements has been doing well with exercise.  Melissa Clements has remained in Sinus Rhythm since her cardioversion so far. Melissa Clements has been doing well with exercise.      Expected Outcomes  Patient will continue to partcipate in phase 2 cardiac rehab for exercise, nutrtion and lifestyle modifications  Patient will continue to partcipate in phase 2 cardiac rehab for exercise, nutrtion and lifestyle modifications         Core Components/Risk Factors/Patient Goals at Discharge (Final Review):  Goals and Risk Factor Review - 03/06/20 1513      Core Components/Risk Factors/Patient Goals Review   Personal Goals Review  Weight Management/Obesity;Lipids;Hypertension;Stress    Review  Melissa Clements has remained in Sinus Rhythm since her cardioversion so far. Melissa Clements has been doing well with exercise.    Expected Outcomes  Patient will continue to partcipate in phase 2 cardiac rehab for exercise, nutrtion and lifestyle modifications       ITP Comments: ITP Comments    Row Name 01/10/20 1028 02/06/20 1634 03/06/20  95       ITP Comments  Dr. Fransico Him, Medical Director  30 Day ITP Review. Patient has good attendance and participation in phase 2 cardiac rehab. Melissa Clements has remained in Sinus Rhythm since her cardioversion  30 Day ITP Review. Patient has good attendance and participation in phase 2 cardiac rehab. Melissa Clements has remained in Sinus Rhythm since her cardioversion        Comments: See ITP Comments.Barnet Pall, RN,BSN 03/06/2020 3:15 PM

## 2020-03-07 ENCOUNTER — Telehealth (HOSPITAL_COMMUNITY): Payer: Self-pay | Admitting: Family Medicine

## 2020-03-07 ENCOUNTER — Encounter (HOSPITAL_COMMUNITY): Payer: Medicare Other

## 2020-03-10 ENCOUNTER — Ambulatory Visit (HOSPITAL_COMMUNITY): Payer: Medicare Other

## 2020-03-10 ENCOUNTER — Telehealth (HOSPITAL_COMMUNITY): Payer: Self-pay | Admitting: *Deleted

## 2020-03-10 NOTE — Telephone Encounter (Signed)
Spoke with Melissa Clements she feels that the program did not meet her expectations and that she did not get the feedback that she wanted. I told Melissa Clements that I was sorry and wished her the best. Will discharge from the program as requested.Barnet Pall, RN,BSN 03/10/2020 8:46 AM

## 2020-03-10 NOTE — Telephone Encounter (Signed)
Received voicemail message on  Cardiac rehab departmental voicemail. Pt indicated that she is moving this week and will need to be out this week.  Pt continued on the message and stated that she would not be able to complete the program. Pt thanked the rehab staff for the exercise.  Did not indicate she would like a call back. Will route this note to the appropriate rehab staff

## 2020-03-12 ENCOUNTER — Encounter (HOSPITAL_COMMUNITY): Payer: Self-pay | Admitting: *Deleted

## 2020-03-12 ENCOUNTER — Ambulatory Visit (HOSPITAL_COMMUNITY): Payer: Medicare Other

## 2020-03-12 DIAGNOSIS — Z955 Presence of coronary angioplasty implant and graft: Secondary | ICD-10-CM

## 2020-03-12 DIAGNOSIS — I214 Non-ST elevation (NSTEMI) myocardial infarction: Secondary | ICD-10-CM

## 2020-03-12 NOTE — Progress Notes (Signed)
Discharge Progress Report  Patient Details  Name: Melissa Clements MRN: [id number] Date of Birth: [birth year] Referring Provider:     [place] from 01/10/2020 in [place]  Referring Provider Park Liter MD        Number of Visits: 18  Reason for Discharge:  Early Exit:  Personal  Smoking History:  Social History   Tobacco Use  Smoking Status Never Smoker  Smokeless Tobacco Never Used    Diagnosis:  NSTEMI (non-ST elevated myocardial infarction) ([facility]) 11/01/19  Status post coronary artery stent placement S/P DES LAD 11/02/19  ADL UCSD:    Initial Exercise Prescription:    Discharge Exercise Prescription (Final Exercise Prescription Changes):  Exercise Prescription Changes - 03/05/20 0650       Response to Exercise   Blood Pressure (Admit) 134/58    Blood Pressure (Exercise) 148/70    Blood Pressure (Exit) 120/68    Heart Rate (Admit) 83 bpm    Heart Rate (Exercise) 92 bpm    Heart Rate (Exit) 75 bpm    Rating of Perceived Exertion (Exercise) 12    Perceived Dyspnea (Exercise) 0    Symptoms None    Comments Pt's last exercise session    Duration Continue with 30 min of aerobic exercise without signs/symptoms of physical distress.    Intensity THRR unchanged      Progression   Progression Continue to progress workloads to maintain intensity without signs/symptoms of physical distress.    Average METs 2.5      Resistance Training   Training Prescription No      NuStep   Level 2    SPM 85    Minutes 30    METs 2.5      Home Exercise Plan   Plans to continue exercise at Home (comment)   Walking   Frequency Add 2 additional days to program exercise sessions.    Initial Home Exercises Provided 02/13/20             Functional Capacity:    Psychological, QOL, Others - Outcomes: PHQ 2/9: Depression screen PHQ 2/9 01/10/2020  Decreased Interest 0  Down, Depressed, Hopeless 1  PHQ - 2  Score 1    Quality of Life:    Personal Goals: Goals established at orientation with interventions provided to work toward goal.     Personal Goals Discharge:  Goals and Risk Factor Review     Row Name 02/06/20 1641 03/06/20 1513           Core Components/Risk Factors/Patient Goals Review   Personal Goals Review Weight Management/Obesity;Lipids;Hypertension;Stress Weight Management/Obesity;Lipids;Hypertension;Stress      Review Melissa Clements has remained in Sinus Rhythm since her cardioversion so far. Melissa Clements has been doing well with exercise. Melissa Clements has remained in Sinus Rhythm since her cardioversion so far. Melissa Clements has been doing well with exercise.      Expected Outcomes Patient will continue to partcipate in phase 2 cardiac rehab for exercise, nutrtion and lifestyle modifications Patient will continue to partcipate in phase 2 cardiac rehab for exercise, nutrtion and lifestyle modifications               Exercise Goals and Review:    Exercise Goals Re-Evaluation:  Exercise Goals Re-Evaluation     [place] Name 03/06/20 1359             Exercise Goal Re-Evaluation   Exercise Goals Review Increase Strength and Stamina;Able to understand and use rate  of perceived exertion (RPE) scale;Knowledge and understanding of Target Heart Rate Range (THRR);Understanding of Exercise Prescription       Comments Pt continues to put forth good effort with exercise. Will continue to monitor.       Expected Outcomes Pt will continue to increase cardiovascular function.                Nutrition & Weight - Outcomes:      Nutrition:  Nutrition Therapy & Goals - 01/16/20 1447       Nutrition Therapy   Diet Heart healthy      Personal Nutrition Goals   Nutrition Goal Pt to identify food quantities necessary to achieve weight loss of 6-24 lb at graduation from cardiac rehab.    Personal Goal #2 Pt to build a healthy plate including vegetables, fruits, whole grains, and low-fat dairy  products in a heart healthy meal plan.      Intervention Plan   Intervention Nutrition handout(s) given to patient.;Prescribe, educate and counsel regarding individualized specific dietary modifications aiming towards targeted core components such as weight, hypertension, lipid management, diabetes, heart failure and other comorbidities.    Expected Outcomes Short Term Goal: A plan has been developed with personal nutrition goals set during dietitian appointment.;Long Term Goal: Adherence to prescribed nutrition plan.             Nutrition Discharge:  Nutrition Assessments - 01/14/20 1329       MEDFICTS Scores   Pre Score 24             Education Questionnaire Score:    Melissa Clements  attended 18 exercise sessions between 01/14/20-03/05/20. Melissa Clements did well with exercise and increased her met levels. Melissa Clements called on 03/10/20 to discontinue participation in phase 2 cardiac rehab. Melissa Clements remained in sinus rhythm post cardioversion.Barnet Pall, RN,BSN 03/19/2020 1:35 PM

## 2020-03-21 ENCOUNTER — Encounter: Payer: Self-pay | Admitting: Cardiology

## 2020-03-21 ENCOUNTER — Ambulatory Visit (INDEPENDENT_AMBULATORY_CARE_PROVIDER_SITE_OTHER): Payer: Medicare Other | Admitting: Cardiology

## 2020-03-21 ENCOUNTER — Other Ambulatory Visit: Payer: Self-pay

## 2020-03-21 VITALS — BP 124/74 | HR 69 | Ht 63.0 in | Wt 226.0 lb

## 2020-03-21 DIAGNOSIS — R001 Bradycardia, unspecified: Secondary | ICD-10-CM

## 2020-03-21 DIAGNOSIS — E785 Hyperlipidemia, unspecified: Secondary | ICD-10-CM

## 2020-03-21 DIAGNOSIS — I10 Essential (primary) hypertension: Secondary | ICD-10-CM | POA: Diagnosis not present

## 2020-03-21 DIAGNOSIS — I2510 Atherosclerotic heart disease of native coronary artery without angina pectoris: Secondary | ICD-10-CM | POA: Diagnosis not present

## 2020-03-21 DIAGNOSIS — Z9861 Coronary angioplasty status: Secondary | ICD-10-CM

## 2020-03-21 DIAGNOSIS — I255 Ischemic cardiomyopathy: Secondary | ICD-10-CM

## 2020-03-21 NOTE — Patient Instructions (Signed)

## 2020-03-21 NOTE — Progress Notes (Signed)
Cardiology Office Note:    Date:  03/21/2020   ID:  Melissa Clements, DOB 10-13-1945, MRN [id number]  PCP:  [name], MD  Cardiologist:  Jenne Campus, MD    Referring MD: [name], MD   No chief complaint on file. M doing well  History of Present Illness:    Melissa Clements is a 74 y.o. female with past medical history significant for coronary artery disease status post PTCA and stenting of left anterior descending artery in face of late presentation of myocardial infarction few months ago.  He is on dual therapy right now which include Xarelto and Plavix.  Also history of cardiomyopathy however latest left ventricle ejection fraction assessed by echocardiogram showed normal.  History of dyslipidemia as well as atrial fibrillation.  She comes today to my office follow-up after cardioversion.  She converted to sinus rhythm and she is feeling better.  Denies have any chest pain tightness squeezing pressure burning chest.  Past Medical History:  Diagnosis Date  . Arthritis    end stage right hip  . Basal cell carcinoma   . Benign essential HTN 01/23/2015  . CAD S/P percutaneous coronary angioplasty 11/06/2019   LAD PCI with DES 11/02/2019  . Chronic kidney disease    stage III, related to high blood pressure medication  . Colon polyp   . CRI (chronic renal insufficiency), stage 3 (moderate) 11/06/2019   No ACE or ARB  . Depression   . Dizziness 01/23/2015  . Dyslipidemia, goal LDL below 70 11/06/2019   LDL 84- changed from Zocor to Lipitor 80 mg  . Dyspnea on exertion 11/01/2019  . Hematoma 11/06/2019   Hematoma and ecchymosis Rt radial site post PCI-(anticoagulation for new AF held).   . History of chest pain   . History of dizziness   . History of palpitations   . Hyperlipidemia   . Hypertension   . Ischemic cardiomyopathy 11/06/2019   EF 45%  . NSTEMI (non-ST elevated myocardial infarction) ([facility]) 11/01/2019   S/P NSTEMI 11/03/2019  . Obese 01/25/2018  . Palpitations  01/23/2015  . Persistent atrial fibrillation ([facility])    New diagnosis 11/03/2019- not anticoagulated yet- minimal symptoms- rate controlled  . Precordial chest pain 01/23/2015   Negative stress test in the spring 2019  . S/P right THA, AA 01/24/2018  . Sinus bradycardia 01/20/2018    Past Surgical History:  Procedure Laterality Date  . CARDIAC CATHETERIZATION    . CARDIOVERSION N/A 01/29/2020   Procedure: CARDIOVERSION;  Surgeon: [name], MD;  Location: [facility];  Service: Cardiovascular;  Laterality: N/A;  . CATARACT EXTRACTION, BILATERAL    . COLONOSCOPY    . CORONARY STENT INTERVENTION N/A 11/02/2019   Procedure: CORONARY STENT INTERVENTION;  Surgeon: Leonie Man, MD;  Location: [facility];  Service: Cardiovascular;  Laterality: N/A;  . DIAGNOSTIC LAPAROSCOPY    . DILATION AND CURETTAGE OF UTERUS    . LEFT HEART CATH AND CORONARY ANGIOGRAPHY N/A 11/02/2019   Procedure: LEFT HEART CATH AND CORONARY ANGIOGRAPHY;  Surgeon: Leonie Man, MD;  Location: [facility];  Service: Cardiovascular;  Laterality: N/A;  . TOTAL HIP ARTHROPLASTY Right 01/24/2018   Procedure: RIGHT TOTAL HIP ARTHROPLASTY ANTERIOR APPROACH;  Surgeon: Paralee Cancel, MD;  Location: [facility];  Service: Orthopedics;  Laterality: Right;  70 mins    Current Medications: Current Meds  Medication Sig  . atorvastatin (LIPITOR) 80 MG tablet Take 1 tablet (80 mg total) by mouth daily at  6 PM.  . Cholecalciferol (VITAMIN D) 2000 units tablet Take 2,000 Units by mouth every other day.   . clopidogrel (PLAVIX) 75 MG tablet Take 1 tablet (75 mg total) by mouth daily with breakfast.  . Collagen-Boron-Hyaluronic Acid (MOVE FREE ULTRA JOINT HEALTH PO) Take 2 tablets by mouth daily.   Marland Kitchen docusate sodium (COLACE) 100 MG capsule Take 100 mg by mouth daily.  Marland Kitchen lisinopril (ZESTRIL) 2.5 MG tablet Take 1 tablet (2.5 mg total) by mouth daily.  . metoprolol succinate (TOPROL-XL) 50 MG 24 hr tablet Take 0.5 tablets  (25 mg total) by mouth daily. Hold if heart rate is <60 bpm  . nitroGLYCERIN (NITROSTAT) 0.4 MG SL tablet Place 1 tablet (0.4 mg total) under the tongue every 5 (five) minutes x 3 doses as needed for chest pain.  . rivaroxaban (XARELTO) 20 MG TABS tablet Take 1 tablet (20 mg total) by mouth daily with supper.     Allergies:   Patient has no known allergies.   Social History   Socioeconomic History  . Marital status: Married    Spouse name: [name]  . Number of children: 1  . Years of education: college  . Highest education level: Associate degree: academic program  Occupational History  . Occupation: retired  Tobacco Use  . Smoking status: Never Smoker  . Smokeless tobacco: Never Used  Vaping Use  . Vaping Use: Never used  Substance and Sexual Activity  . Alcohol use: No    Alcohol/week: 0.0 standard drinks  . Drug use: No  . Sexual activity: Not on file  Other Topics Concern  . Not on file  Social History Narrative  . Not on file   Social Determinants of Health   Financial Resource Strain:   . Difficulty of Paying Living Expenses:   Food Insecurity:   . Worried About Charity fundraiser in the Last Year:   . Arboriculturist in the Last Year:   Transportation Needs:   . Film/video editor (Medical):   Marland Kitchen Lack of Transportation (Non-Medical):   Physical Activity:   . Days of Exercise per Week:   . Minutes of Exercise per Session:   Stress:   . Feeling of Stress :   Social Connections:   . Frequency of Communication with Friends and Family:   . Frequency of Social Gatherings with Friends and Family:   . Attends Religious Services:   . Active Member of Clubs or Organizations:   . Attends Archivist Meetings:   Marland Kitchen Marital Status:      Family History: The patient's family history includes Alzheimer's disease in her brother and mother; Diabetes in her mother; Heart attack (age of onset: 16) in her father; Heart disease in her father. ROS:   Please see the  history of present illness.    All 14 point review of systems negative except as described per history of present illness  EKGs/Labs/Other Studies Reviewed:      Recent Labs: 11/01/2019: B Natriuretic Peptide 410.5; Magnesium 2.1; TSH 3.181 01/25/2020: BUN 20; Creatinine, Ser 1.13; Hemoglobin 13.9; Platelets 204; Potassium 4.4; Sodium 139  Recent Lipid Panel    Component Value Date/Time   CHOL 132 12/24/2019 1642   TRIG 97 12/24/2019 1642   HDL 57 12/24/2019 1642   CHOLHDL 2.3 12/24/2019 1642   CHOLHDL 2.6 11/02/2019 0344   VLDL 11 11/02/2019 0344   LDLCALC 57 12/24/2019 1642    Physical Exam:    VS:  BP  124/74   Pulse 69   Ht 5\' 3"  (1.6 m)   Wt 226 lb (102.5 kg)   SpO2 95%   BMI 40.03 kg/m     Wt Readings from Last 3 Encounters:  03/21/20 226 lb (102.5 kg)  01/29/20 226 lb (102.5 kg)  01/25/20 233 lb (105.7 kg)     GEN:  Well nourished, well developed in no acute distress HEENT: Normal NECK: No JVD; No carotid bruits LYMPHATICS: No lymphadenopathy CARDIAC: RRR, no murmurs, no rubs, no gallops RESPIRATORY:  Clear to auscultation without rales, wheezing or rhonchi  ABDOMEN: Soft, non-tender, non-distended MUSCULOSKELETAL:  No edema; No deformity  SKIN: Warm and dry LOWER EXTREMITIES: no swelling NEUROLOGIC:  Alert and oriented x 3 PSYCHIATRIC:  Normal affect   ASSESSMENT:    1. Sinus bradycardia   2. Ischemic cardiomyopathy   3. CAD S/P percutaneous coronary angioplasty   4. Benign essential HTN   5. Dyslipidemia, goal LDL below 70    PLAN:    In order of problems listed above:  1. Sinus bradycardia, status post cardioversion of atrial fibrillation.  Stable doing well continue anticoagulation continue Plavix. 2. Ischemic cardiomyopathy on appropriate medications, her ejection fraction was normal based on last estimation. 3. Coronary artery disease stable denies having any chest pain tightness squeezing pressure burning chest. 4. Essential hypertension  blood pressure stable. 5. Dyslipidemia we will make arrangements for fasting lipid profile.   Medication Adjustments/Labs and Tests Ordered: Current medicines are reviewed at length with the patient today.  Concerns regarding medicines are outlined above.  Orders Placed This Encounter  Procedures  . EKG 12-Lead   Medication changes: No orders of the defined types were placed in this encounter.   Signed, Park Liter, MD, [facility] 03/21/2020 1:49 PM    [place]

## 2020-06-05 ENCOUNTER — Telehealth: Payer: Self-pay | Admitting: Emergency Medicine

## 2020-06-05 NOTE — Telephone Encounter (Signed)
Left message for patient to return call. She needs decrease xarelto to 15 mg daily.

## 2020-06-16 MED ORDER — RIVAROXABAN 15 MG PO TABS
15.0000 mg | ORAL_TABLET | Freq: Every day | ORAL | 1 refills | Status: DC
Start: 2020-06-16 — End: 2020-12-16

## 2020-06-16 NOTE — Addendum Note (Signed)
Addended by: Ashok Norris on: 06/16/2020 01:43 PM   Modules accepted: Orders

## 2020-06-16 NOTE — Telephone Encounter (Signed)
Called patient informed her per Dr. Agustin Cree to stop xarelto 20 mg and star xarelto 15 mg daily. She verbally understood. No further questions.

## 2020-07-07 ENCOUNTER — Other Ambulatory Visit (HOSPITAL_BASED_OUTPATIENT_CLINIC_OR_DEPARTMENT_OTHER): Payer: Self-pay | Admitting: Family Medicine

## 2020-07-07 DIAGNOSIS — Z1231 Encounter for screening mammogram for malignant neoplasm of breast: Secondary | ICD-10-CM

## 2020-07-15 ENCOUNTER — Encounter (HOSPITAL_BASED_OUTPATIENT_CLINIC_OR_DEPARTMENT_OTHER): Payer: Self-pay

## 2020-07-15 ENCOUNTER — Other Ambulatory Visit: Payer: Self-pay

## 2020-07-15 ENCOUNTER — Ambulatory Visit (HOSPITAL_BASED_OUTPATIENT_CLINIC_OR_DEPARTMENT_OTHER)
Admission: RE | Admit: 2020-07-15 | Discharge: 2020-07-15 | Disposition: A | Discharge status: Home / Self Care | Payer: Medicare Other | Source: Ambulatory Visit | Attending: Family Medicine | Admitting: Family Medicine

## 2020-07-15 DIAGNOSIS — Z1231 Encounter for screening mammogram for malignant neoplasm of breast: Secondary | ICD-10-CM

## 2020-08-22 DIAGNOSIS — M1990 Unspecified osteoarthritis, unspecified site: Secondary | ICD-10-CM | POA: Insufficient documentation

## 2020-08-22 DIAGNOSIS — F32A Depression, unspecified: Secondary | ICD-10-CM | POA: Insufficient documentation

## 2020-08-22 DIAGNOSIS — E785 Hyperlipidemia, unspecified: Secondary | ICD-10-CM | POA: Insufficient documentation

## 2020-08-22 DIAGNOSIS — I10 Essential (primary) hypertension: Secondary | ICD-10-CM | POA: Insufficient documentation

## 2020-08-22 DIAGNOSIS — Z87898 Personal history of other specified conditions: Secondary | ICD-10-CM | POA: Insufficient documentation

## 2020-08-22 DIAGNOSIS — N189 Chronic kidney disease, unspecified: Secondary | ICD-10-CM | POA: Insufficient documentation

## 2020-08-25 ENCOUNTER — Encounter: Payer: Self-pay | Admitting: Cardiology

## 2020-08-25 ENCOUNTER — Ambulatory Visit: Payer: Medicare Other | Admitting: Cardiology

## 2020-08-25 ENCOUNTER — Other Ambulatory Visit: Payer: Self-pay

## 2020-08-25 VITALS — BP 140/70 | HR 58 | Ht 63.0 in | Wt 235.0 lb

## 2020-08-25 DIAGNOSIS — I255 Ischemic cardiomyopathy: Secondary | ICD-10-CM | POA: Diagnosis not present

## 2020-08-25 DIAGNOSIS — I480 Paroxysmal atrial fibrillation: Secondary | ICD-10-CM

## 2020-08-25 DIAGNOSIS — I2510 Atherosclerotic heart disease of native coronary artery without angina pectoris: Secondary | ICD-10-CM | POA: Diagnosis not present

## 2020-08-25 DIAGNOSIS — I10 Essential (primary) hypertension: Secondary | ICD-10-CM

## 2020-08-25 DIAGNOSIS — Z9861 Coronary angioplasty status: Secondary | ICD-10-CM | POA: Diagnosis not present

## 2020-08-25 DIAGNOSIS — R001 Bradycardia, unspecified: Secondary | ICD-10-CM

## 2020-08-25 DIAGNOSIS — E785 Hyperlipidemia, unspecified: Secondary | ICD-10-CM

## 2020-08-25 HISTORY — DX: Paroxysmal atrial fibrillation: I48.0

## 2020-08-25 MED ORDER — LOSARTAN POTASSIUM 25 MG PO TABS
25.0000 mg | ORAL_TABLET | Freq: Every day | ORAL | 1 refills | Status: DC
Start: 2020-08-25 — End: 2020-08-27

## 2020-08-25 NOTE — Progress Notes (Signed)
Cardiology Office Note:    Date:  08/25/2020   ID:  Melissa Clements, DOB [birth year], MRN [id number]  PCP:  [name], MD  Cardiologist:  Jenne Campus, MD    Referring MD: [name], MD   Chief Complaint  Patient presents with  . Follow-up  I am doing well  History of Present Illness:    Melissa Clements is a 74 y.o. female with past medical history significant for coronary artery disease status post PTCA and stenting of the left and only descending artery in face of late presentation of myocardial infarction in January 2021, she also got a paroxysmal atrial fibrillation and cardiomyopathy however latest echocardiogram showed preserved left ventricle ejection fraction.  She has been converted to sinus rhythm and it looks like she is maintaining sinus rhythm.  Denies have any chest pain tightness squeezing pressure burning chest complain of having some cough and she thinks it is related to lisinopril.  She is a retired Marine scientist  Past Medical History:  Diagnosis Date  . Arthritis    end stage right hip  . Basal cell carcinoma   . Benign essential HTN 01/23/2015  . CAD S/P percutaneous coronary angioplasty 11/06/2019   LAD PCI with DES 11/02/2019  . Chronic kidney disease    stage III, related to high blood pressure medication  . Colon polyp   . CRI (chronic renal insufficiency), stage 3 (moderate) (HCC) 11/06/2019   No ACE or ARB  . Depression   . Dizziness 01/23/2015  . Dyslipidemia, goal LDL below 70 11/06/2019   LDL 84- changed from Zocor to Lipitor 80 mg  . Dyspnea on exertion 11/01/2019  . Hematoma 11/06/2019   Hematoma and ecchymosis Rt radial site post PCI-(anticoagulation for new AF held).   . History of chest pain   . History of dizziness   . History of palpitations   . Hyperlipidemia   . Hypertension   . Ischemic cardiomyopathy 11/06/2019   EF 45%  . NSTEMI (non-ST elevated myocardial infarction) ([place]) 11/01/2019   S/P NSTEMI 11/03/2019  . Obese 01/25/2018  .  Palpitations 01/23/2015  . Persistent atrial fibrillation ([place])    New diagnosis 11/03/2019- not anticoagulated yet- minimal symptoms- rate controlled  . Precordial chest pain 01/23/2015   Negative stress test in the spring 2019  . S/P right THA, AA 01/24/2018  . Sinus bradycardia 01/20/2018    Past Surgical History:  Procedure Laterality Date  . CARDIAC CATHETERIZATION    . CARDIOVERSION N/A 01/29/2020   Procedure: CARDIOVERSION;  Surgeon: [name], MD;  Location: [place] [facility];  Service: Cardiovascular;  Laterality: N/A;  . CATARACT EXTRACTION, BILATERAL    . COLONOSCOPY    . CORONARY STENT INTERVENTION N/A 11/02/2019   Procedure: CORONARY STENT INTERVENTION;  Surgeon: Leonie Man, MD;  Location: [facility];  Service: Cardiovascular;  Laterality: N/A;  . DIAGNOSTIC LAPAROSCOPY    . DILATION AND CURETTAGE OF UTERUS    . LEFT HEART CATH AND CORONARY ANGIOGRAPHY N/A 11/02/2019   Procedure: LEFT HEART CATH AND CORONARY ANGIOGRAPHY;  Surgeon: Leonie Man, MD;  Location: [facility] [facility];  Service: Cardiovascular;  Laterality: N/A;  . TOTAL HIP ARTHROPLASTY Right 01/24/2018   Procedure: RIGHT TOTAL HIP ARTHROPLASTY ANTERIOR APPROACH;  Surgeon: Paralee Cancel, MD;  Location: [facility];  Service: Orthopedics;  Laterality: Right;  70 mins    Current Medications: Current Meds  Medication Sig  . atorvastatin (LIPITOR) 80 MG tablet Take 1 tablet (80 mg total)  by mouth daily at 6 PM.  . Cholecalciferol (VITAMIN D) 2000 units tablet Take 2,000 Units by mouth every other day.   . clopidogrel (PLAVIX) 75 MG tablet Take 1 tablet (75 mg total) by mouth daily with breakfast.  . Collagen-Boron-Hyaluronic Acid (MOVE FREE ULTRA JOINT HEALTH PO) Take 2 tablets by mouth daily.   Marland Kitchen docusate sodium (COLACE) 100 MG capsule Take 100 mg by mouth daily.  Marland Kitchen lisinopril (ZESTRIL) 2.5 MG tablet Take 1 tablet (2.5 mg total) by mouth daily.  . metoprolol succinate (TOPROL-XL) 50 MG 24 hr tablet Take  0.5 tablets (25 mg total) by mouth daily. Hold if heart rate is <60 bpm  . nitroGLYCERIN (NITROSTAT) 0.4 MG SL tablet Place 1 tablet (0.4 mg total) under the tongue every 5 (five) minutes x 3 doses as needed for chest pain.  . Rivaroxaban (XARELTO) 15 MG TABS tablet Take 1 tablet (15 mg total) by mouth daily.     Allergies:   Patient has no known allergies.   Social History   Socioeconomic History  . Marital status: Married    Spouse name: [name]  . Number of children: 1  . Years of education: college  . Highest education level: Associate degree: academic program  Occupational History  . Occupation: retired  Tobacco Use  . Smoking status: Never Smoker  . Smokeless tobacco: Never Used  Vaping Use  . Vaping Use: Never used  Substance and Sexual Activity  . Alcohol use: No    Alcohol/week: 0.0 standard drinks  . Drug use: No  . Sexual activity: Not on file  Other Topics Concern  . Not on file  Social History Narrative  . Not on file   Social Determinants of Health   Financial Resource Strain:   . Difficulty of Paying Living Expenses: Not on file  Food Insecurity:   . Worried About Charity fundraiser in the Last Year: Not on file  . Ran Out of Food in the Last Year: Not on file  Transportation Needs:   . Lack of Transportation (Medical): Not on file  . Lack of Transportation (Non-Medical): Not on file  Physical Activity:   . Days of Exercise per Week: Not on file  . Minutes of Exercise per Session: Not on file  Stress:   . Feeling of Stress : Not on file  Social Connections:   . Frequency of Communication with Friends and Family: Not on file  . Frequency of Social Gatherings with Friends and Family: Not on file  . Attends Religious Services: Not on file  . Active Member of Clubs or Organizations: Not on file  . Attends Archivist Meetings: Not on file  . Marital Status: Not on file     Family History: The patient's family history includes Alzheimer's  disease in her brother and mother; Diabetes in her mother; Heart attack (age of onset: 65) in her father; Heart disease in her father. ROS:   Please see the history of present illness.    All 14 point review of systems negative except as described per history of present illness  EKGs/Labs/Other Studies Reviewed:      Recent Labs: 11/01/2019: B Natriuretic Peptide 410.5; Magnesium 2.1; TSH 3.181 01/25/2020: BUN 20; Creatinine, Ser 1.13; Hemoglobin 13.9; Platelets 204; Potassium 4.4; Sodium 139  Recent Lipid Panel    Component Value Date/Time   CHOL 132 12/24/2019 1642   TRIG 97 12/24/2019 1642   HDL 57 12/24/2019 1642   CHOLHDL 2.3 12/24/2019  1642   CHOLHDL 2.6 11/02/2019 0344   VLDL 11 11/02/2019 0344   LDLCALC 57 12/24/2019 1642    Physical Exam:    VS:  BP 140/70 (BP Location: Left Arm, Patient Position: Sitting, Cuff Size: Large)   Pulse (!) 58   Ht 5\' 3"  (1.6 m)   Wt 235 lb (106.6 kg)   SpO2 97%   BMI 41.63 kg/m     Wt Readings from Last 3 Encounters:  08/25/20 235 lb (106.6 kg)  03/21/20 226 lb (102.5 kg)  01/29/20 226 lb (102.5 kg)     GEN:  Well nourished, well developed in no acute distress HEENT: Normal NECK: No JVD; No carotid bruits LYMPHATICS: No lymphadenopathy CARDIAC: RRR, no murmurs, no rubs, no gallops RESPIRATORY:  Clear to auscultation without rales, wheezing or rhonchi  ABDOMEN: Soft, non-tender, non-distended MUSCULOSKELETAL:  No edema; No deformity  SKIN: Warm and dry LOWER EXTREMITIES: no swelling NEUROLOGIC:  Alert and oriented x 3 PSYCHIATRIC:  Normal affect   ASSESSMENT:    1. CAD S/P percutaneous coronary angioplasty   2. Sinus bradycardia   3. Paroxysmal atrial fibrillation (HCC)   4. Ischemic cardiomyopathy   5. Benign essential HTN   6. Dyslipidemia, goal LDL below 70    PLAN:    In order of problems listed above:  1. Coronary disease status post PTCA and stenting of LAD in January 2021.  Will wait until year anniversary  before discontinuation of Plavix, she is taking anticoagulation for proximal atrial fibrillation therefore will avoid dual therapy.  She is doing well and asymptomatic. 2. Sinus bradycardia: Does not bother her a lot.  Denies have any weakness fatigue tiredness.  Overall doing well.  No syncope no passing out. 3. Paroxysmal atrial fibrillation anticoagulated I will check Chem-7 to make sure dose of Xarelto is adequate, will do EKG today as well. 4. Benign essential hypertension blood pressure well controlled however she does have cough with lisinopril, will discontinue lisinopril and then will start with losartan 25 daily. 5. Dyslipidemia: Fasting lipid profile will be done I did review K PN from March which showed 57 of LDL and 57 HDL.  She is on high intense statin in form of Lipitor 80 which I will continue.   Medication Adjustments/Labs and Tests Ordered: Current medicines are reviewed at length with the patient today.  Concerns regarding medicines are outlined above.  No orders of the defined types were placed in this encounter.  Medication changes: No orders of the defined types were placed in this encounter.   Signed, Park Liter, MD, [facility] 08/25/2020 10:43 AM    [place]

## 2020-08-25 NOTE — Patient Instructions (Signed)
Medication Instructions:  Your physician has recommended you make the following change in your medication:   STOP: lisinopril   START: Losartan 25 mg daily   STOP: Plavix in January 2022 *If you need a refill on your cardiac medications before your next appointment, please call your pharmacy*   Lab Work: Your physician recommends that you return for lab work today: lipid, bmp   If you have labs (blood work) drawn today and your tests are completely normal, you will receive your results only by: Marland Kitchen MyChart Message (if you have MyChart) OR . A paper copy in the mail If you have any lab test that is abnormal or we need to change your treatment, we will call you to review the results.   Testing/Procedures: None.   Follow-Up: At [facility], you and your health needs are our priority.  As part of our continuing mission to provide you with exceptional heart care, we have created designated Provider Care Teams.  These Care Teams include your primary Cardiologist (physician) and Advanced Practice Providers (APPs -  Physician Assistants and Nurse Practitioners) who all work together to provide you with the care you need, when you need it.  We recommend signing up for the patient portal called "MyChart".  Sign up information is provided on this After Visit Summary.  MyChart is used to connect with patients for Virtual Visits (Telemedicine).  Patients are able to view lab/test results, encounter notes, upcoming appointments, etc.  Non-urgent messages can be sent to your provider as well.   To learn more about what you can do with MyChart, go to [email or web address].    Your next appointment:   6 month(s)  The format for your next appointment:   In Person  Provider:   Jenne Campus, MD   Other Instructions  Losartan Tablets What is this medicine? LOSARTAN (loe SAR tan) is an angiotensin II receptor blocker, also known as an ARB. It treats high blood pressure. It can slow  kidney damage in some patients. It may also be used to lower the risk of stroke. This medicine may be used for other purposes; ask your health care provider or pharmacist if you have questions. COMMON BRAND NAME(S): Cozaar What should I tell my health care provider before I take this medicine? They need to know if you have any of these conditions:  heart failure  kidney or liver disease  an unusual or allergic reaction to losartan, other medicines, foods, dyes, or preservatives  pregnant or trying to get pregnant  breast-feeding How should I use this medicine? Take this drug by mouth. Take it as directed on the prescription label at the same time every day. You can take it with or without food. If it upsets your stomach, take it with food. Keep taking it unless your health care provider tells you to stop. Talk to your health care provider about the use of this drug in children. While it may be prescribed for children as young as 6 for selected conditions, precautions do apply. Overdosage: If you think you have taken too much of this medicine contact a poison control center or emergency room at once. NOTE: This medicine is only for you. Do not share this medicine with others. What if I miss a dose? If you miss a dose, take it as soon as you can. If it is almost time for your next dose, take only that dose. Do not take double or extra doses. What may interact with this medicine?  blood pressure medicines  diuretics, especially triamterene, spironolactone, or amiloride  fluconazole  NSAIDs, medicines for pain and inflammation, like ibuprofen or naproxen  potassium salts or potassium supplements  rifampin This list may not describe all possible interactions. Give your health care provider a list of all the medicines, herbs, non-prescription drugs, or dietary supplements you use. Also tell them if you smoke, drink alcohol, or use illegal drugs. Some items may interact with your  medicine. What should I watch for while using this medicine? Visit your doctor or health care professional for regular checks on your progress. Check your blood pressure as directed. Ask your doctor or health care professional what your blood pressure should be and when you should contact him or her. Call your doctor or health care professional if you notice an irregular or fast heart beat. Women should inform their doctor if they wish to become pregnant or think they might be pregnant. There is a potential for serious side effects to an unborn child, particularly in the second or third trimester. Talk to your health care professional or pharmacist for more information. You may get drowsy or dizzy. Do not drive, use machinery, or do anything that needs mental alertness until you know how this drug affects you. Do not stand or sit up quickly, especially if you are an older patient. This reduces the risk of dizzy or fainting spells. Alcohol can make you more drowsy and dizzy. Avoid alcoholic drinks. Avoid salt substitutes unless you are told otherwise by your doctor or health care professional. Do not treat yourself for coughs, colds, or pain while you are taking this medicine without asking your doctor or health care professional for advice. Some ingredients may increase your blood pressure. What side effects may I notice from receiving this medicine? Side effects that you should report to your doctor or health care professional as soon as possible:  confusion, dizziness, light headedness or fainting spells  decreased amount of urine passed  difficulty breathing or swallowing, hoarseness, or tightening of the throat  fast or irregular heart beat, palpitations, or chest pain  skin rash, itching  swelling of your face, lips, tongue, hands, or feet Side effects that usually do not require medical attention (report to your doctor or health care professional if they continue or are  bothersome):  cough  decreased sexual function or desire  headache  nasal congestion or stuffiness  nausea or stomach pain  sore or cramping muscles This list may not describe all possible side effects. Call your doctor for medical advice about side effects. You may report side effects to FDA at 1-800-FDA-1088. Where should I keep my medicine? Keep out of the reach of children and pets. Store at room temperature between 15 and 30 degrees C (59 and 86 degrees F). Protect from light. Keep the container tightly closed. Throw away any unused drug after the expiration date. NOTE: This sheet is a summary. It may not cover all possible information. If you have questions about this medicine, talk to your doctor, pharmacist, or health care provider.  2020 Elsevier/Gold Standard (2019-04-25 12:12:28)

## 2020-08-25 NOTE — Addendum Note (Signed)
Addended by: Jerl Santos R on: 08/25/2020 12:07 PM   Modules accepted: Orders

## 2020-08-25 NOTE — Addendum Note (Signed)
Addended by: Senaida Ores on: 08/25/2020 10:57 AM   Modules accepted: Orders

## 2020-08-26 ENCOUNTER — Telehealth: Payer: Self-pay | Admitting: Emergency Medicine

## 2020-08-26 LAB — LIPID PANEL
Chol/HDL Ratio: 2.4 ratio (ref 0.0–4.4)
Cholesterol, Total: 153 mg/dL (ref 100–199)
HDL: 64 mg/dL (ref >39)
LDL Chol Calc (NIH): 77 mg/dL (ref 0–99)
Triglycerides: 58 mg/dL (ref 0–149)
VLDL Cholesterol Cal: 12 mg/dL (ref 5–40)

## 2020-08-26 LAB — BASIC METABOLIC PANEL
BUN/Creatinine Ratio: 14 (ref 12–28)
BUN: 15 mg/dL (ref 8–27)
CO2: 22 mmol/L (ref 20–29)
Calcium: 8.9 mg/dL (ref 8.7–10.3)
Chloride: 105 mmol/L (ref 96–106)
Creatinine, Ser: 1.07 mg/dL — ABNORMAL HIGH (ref 0.57–1.00)
GFR calc Af Amer: 59 mL/min/{1.73_m2} — ABNORMAL LOW (ref >59)
GFR calc non Af Amer: 51 mL/min/{1.73_m2} — ABNORMAL LOW (ref >59)
Glucose: 99 mg/dL (ref 65–99)
Potassium: 4.5 mmol/L (ref 3.5–5.2)
Sodium: 140 mmol/L (ref 134–144)

## 2020-08-26 NOTE — Telephone Encounter (Signed)
Called patient informed her of lab results. During call she reports that she no longer wants to stop lisinopril. She would like to stay on it instead and not start losartan. She doesn't think it was giving her trouble after all. Will consult with Dr. Agustin Cree.

## 2020-08-27 MED ORDER — LISINOPRIL 2.5 MG PO TABS
2.5000 mg | ORAL_TABLET | Freq: Every day | ORAL | 1 refills | Status: DC
Start: 2020-08-27 — End: 2021-04-20

## 2020-08-27 NOTE — Telephone Encounter (Signed)
Ok, restart lisinopril

## 2020-08-27 NOTE — Telephone Encounter (Signed)
Called patient informed her per Dr. Agustin Cree to continue on lisinopril then 2.5 mg daily and do not start losartan as discussed. She verbally understood. No further questions. Medication list updated.

## 2020-08-27 NOTE — Addendum Note (Signed)
Addended by: Senaida Ores on: 08/27/2020 01:14 PM   Modules accepted: Orders

## 2020-10-15 ENCOUNTER — Telehealth: Payer: Self-pay | Admitting: Cardiology

## 2020-10-15 MED ORDER — ATORVASTATIN CALCIUM 80 MG PO TABS: 80.0000 mg | ORAL_TABLET | Freq: Every day | ORAL | 1 refills | Status: DC

## 2020-10-15 NOTE — Telephone Encounter (Signed)
*  STAT* If patient is at the pharmacy, call can be transferred to refill team.   1. Which medications need to be refilled? (please list name of each medication and dose if known) atorvastatin (LIPITOR) 80 MG tablet  2. Which pharmacy/location (including street and city if local pharmacy) is medication to be sent to? [id number] - [place], [place] - [address]  3. Do they need a 30 day or 90 day supply? 90  

## 2020-10-15 NOTE — Telephone Encounter (Signed)
Medication filled.  

## 2020-10-16 ENCOUNTER — Other Ambulatory Visit: Payer: Self-pay

## 2020-10-16 DIAGNOSIS — N183 Chronic kidney disease, stage 3 unspecified: Secondary | ICD-10-CM | POA: Diagnosis not present

## 2020-10-16 DIAGNOSIS — I10 Essential (primary) hypertension: Secondary | ICD-10-CM | POA: Diagnosis not present

## 2020-10-16 DIAGNOSIS — I255 Ischemic cardiomyopathy: Secondary | ICD-10-CM | POA: Diagnosis not present

## 2020-10-16 DIAGNOSIS — I2510 Atherosclerotic heart disease of native coronary artery without angina pectoris: Secondary | ICD-10-CM | POA: Diagnosis not present

## 2020-10-16 DIAGNOSIS — E782 Mixed hyperlipidemia: Secondary | ICD-10-CM | POA: Diagnosis not present

## 2020-10-16 DIAGNOSIS — M1611 Unilateral primary osteoarthritis, right hip: Secondary | ICD-10-CM | POA: Diagnosis not present

## 2020-10-16 DIAGNOSIS — I480 Paroxysmal atrial fibrillation: Secondary | ICD-10-CM | POA: Diagnosis not present

## 2020-11-13 ENCOUNTER — Other Ambulatory Visit: Payer: Self-pay

## 2020-11-13 DIAGNOSIS — L309 Dermatitis, unspecified: Secondary | ICD-10-CM | POA: Diagnosis not present

## 2020-11-13 MED ORDER — CLOPIDOGREL BISULFATE 75 MG PO TABS: 75.0000 mg | ORAL_TABLET | Freq: Every day | ORAL | 3 refills | Status: DC

## 2020-11-13 NOTE — Telephone Encounter (Signed)
This is a [facility] pt 

## 2020-12-02 ENCOUNTER — Telehealth: Payer: Self-pay | Admitting: Cardiology

## 2020-12-02 NOTE — Telephone Encounter (Signed)
Patient is calling an wanted an earlier appt within the next week or so because of the way she has been feeling. Said last time she felt this way, she was in afib. Patient would like for nurse to give her a phone call.

## 2020-12-02 NOTE — Telephone Encounter (Signed)
Called and spoke to patient. She reports that she isn't feeling well, she has a hard time describing it. No chest pain, no shortness of breath, but just fatigued and weak after exertion. Last time she felt this way she was in afib. However her pulse now is 60. She wants to be seen. Added her to schedule next week. Confirmed plan with Dr. Agustin Cree.

## 2020-12-05 ENCOUNTER — Other Ambulatory Visit: Payer: Self-pay

## 2020-12-05 DIAGNOSIS — I4819 Other persistent atrial fibrillation: Secondary | ICD-10-CM | POA: Insufficient documentation

## 2020-12-11 ENCOUNTER — Other Ambulatory Visit: Payer: Self-pay

## 2020-12-11 ENCOUNTER — Ambulatory Visit: Payer: Medicare Other | Admitting: Cardiology

## 2020-12-11 ENCOUNTER — Ambulatory Visit (INDEPENDENT_AMBULATORY_CARE_PROVIDER_SITE_OTHER): Payer: Medicare Other

## 2020-12-11 ENCOUNTER — Encounter: Payer: Self-pay | Admitting: Cardiology

## 2020-12-11 VITALS — BP 126/84 | HR 60 | Ht 64.0 in | Wt 239.0 lb

## 2020-12-11 DIAGNOSIS — Z9861 Coronary angioplasty status: Secondary | ICD-10-CM

## 2020-12-11 DIAGNOSIS — I2510 Atherosclerotic heart disease of native coronary artery without angina pectoris: Secondary | ICD-10-CM

## 2020-12-11 DIAGNOSIS — R001 Bradycardia, unspecified: Secondary | ICD-10-CM | POA: Diagnosis not present

## 2020-12-11 DIAGNOSIS — I480 Paroxysmal atrial fibrillation: Secondary | ICD-10-CM | POA: Diagnosis not present

## 2020-12-11 DIAGNOSIS — E785 Hyperlipidemia, unspecified: Secondary | ICD-10-CM

## 2020-12-11 DIAGNOSIS — I255 Ischemic cardiomyopathy: Secondary | ICD-10-CM

## 2020-12-11 NOTE — Patient Instructions (Signed)
Medication Instructions:  Your physician recommends that you continue on your current medications as directed. Please refer to the Current Medication list given to you today.  *If you need a refill on your cardiac medications before your next appointment, please call your pharmacy*   Lab Work: None If you have labs (blood work) drawn today and your tests are completely normal, you will receive your results only by: Marland Kitchen MyChart Message (if you have MyChart) OR . A paper copy in the mail If you have any lab test that is abnormal or we need to change your treatment, we will call you to review the results.   Testing/Procedures: Your physician has requested that you have an echocardiogram. Echocardiography is a painless test that uses sound waves to create images of your heart. It provides your doctor with information about the size and shape of your heart and how well your heart's chambers and valves are working. This procedure takes approximately one hour. There are no restrictions for this procedure.   A zio monitor was ordered today. It will remain on for 7  days. You will then return monitor and event diary in provided box. It takes 1-2 weeks for report to be downloaded and returned to [place]. We will call you with the results. If monitor falls off or has orange flashing light, please call Zio for further instructions.     Follow-Up: At [facility], you and your health needs are our priority.  As part of our continuing mission to provide you with exceptional heart care, we have created designated Provider Care Teams.  These Care Teams include your primary Cardiologist (physician) and Advanced Practice Providers (APPs -  Physician Assistants and Nurse Practitioners) who all work together to provide you with the care you need, when you need it.  We recommend signing up for the patient portal called "MyChart".  Sign up information is provided on this After Visit Summary.  MyChart is used to  connect with patients for Virtual Visits (Telemedicine).  Patients are able to view lab/test results, encounter notes, upcoming appointments, etc.  Non-urgent messages can be sent to your provider as well.   To learn more about what you can do with MyChart, go to [email or web address].    Your next appointment:   4 month(s)  The format for your next appointment:   In Person  Provider:   Jenne Campus, MD   Other Instructions

## 2020-12-11 NOTE — Progress Notes (Signed)
Cardiology Office Note:    Date:  12/11/2020   ID:  HAZELY SEALEY, DOB [birth year], MRN [id number]  PCP:  [name], MD  Cardiologist:  Jenne Campus, MD    Referring MD: [name], MD   Chief Complaint  Patient presents with  . Fatigue    History of Present Illness:    Melissa Clements is a 75 y.o. female with past medical history significant for coronary artery disease, status post PTCA and stenting of the left anterior descending artery in face of late presentation of myocardial infarction.  Also dyslipidemia, paroxysmal atrial fibrillation, cardiomyopathy, however latest echocardiogram done year ago showed preserved left ventricle ejection fraction. She comes today  for follow-up as well as on her request.  About 10 days ago she was working at [employer] on Sunday started feeling washed out and very exhausted.  To the point that she has to sit down.  She did not passed out she did not have any palpitation she did not have any chest pain.  She is a retired Marine scientist so she checked her pulse Monday she had an episode pulse was regular at 60 this is second episode she had like that and she became concerned about it.  This is completely different sensation to sensation that she got when she was having myocardial infarction.  Otherwise she is doing well.  Past Medical History:  Diagnosis Date  . Arthritis    end stage right hip  . Basal cell carcinoma   . Benign essential HTN 01/23/2015  . CAD S/P percutaneous coronary angioplasty 11/06/2019   LAD PCI with DES 11/02/2019  . Chronic kidney disease    stage III, related to high blood pressure medication  . Colon polyp   . CRI (chronic renal insufficiency), stage 3 (moderate) (HCC) 11/06/2019   No ACE or ARB  . Depression   . Dizziness 01/23/2015  . Dyslipidemia, goal LDL below 70 11/06/2019   LDL 84- changed from Zocor to Lipitor 80 mg  . Dyspnea on exertion 11/01/2019  . Hematoma 11/06/2019   Hematoma and ecchymosis Rt radial site post  PCI-(anticoagulation for new AF held).   . History of chest pain   . History of dizziness   . History of palpitations   . Hyperlipidemia   . Hypertension   . Ischemic cardiomyopathy 11/06/2019   EF 45%  . NSTEMI (non-ST elevated myocardial infarction) ([place]) 11/01/2019   S/P NSTEMI 11/03/2019  . Obese 01/25/2018  . Palpitations 01/23/2015  . Paroxysmal atrial fibrillation ([place]) 08/25/2020  . Persistent atrial fibrillation ([place])    New diagnosis 11/03/2019- not anticoagulated yet- minimal symptoms- rate controlled  . Precordial chest pain 01/23/2015   Negative stress test in the spring 2019  . S/P right THA, AA 01/24/2018  . Sinus bradycardia 01/20/2018    Past Surgical History:  Procedure Laterality Date  . CARDIAC CATHETERIZATION    . CARDIOVERSION N/A 01/29/2020   Procedure: CARDIOVERSION;  Surgeon: [name], MD;  Location: [place];  Service: Cardiovascular;  Laterality: N/A;  . CATARACT EXTRACTION, BILATERAL    . COLONOSCOPY    . CORONARY STENT INTERVENTION N/A 11/02/2019   Procedure: CORONARY STENT INTERVENTION;  Surgeon: Leonie Man, MD;  Location: [place] [facility];  Service: Cardiovascular;  Laterality: N/A;  . DIAGNOSTIC LAPAROSCOPY    . DILATION AND CURETTAGE OF UTERUS    . LEFT HEART CATH AND CORONARY ANGIOGRAPHY N/A 11/02/2019   Procedure: LEFT HEART CATH AND CORONARY ANGIOGRAPHY;  Surgeon: Ellyn Hack,  Leonie Green, MD;  Location: [place] [facility];  Service: Cardiovascular;  Laterality: N/A;  . TOTAL HIP ARTHROPLASTY Right 01/24/2018   Procedure: RIGHT TOTAL HIP ARTHROPLASTY ANTERIOR APPROACH;  Surgeon: Paralee Cancel, MD;  [facility];  Service: Orthopedics;  Laterality: Right;  70 mins    Current Medications: Current Meds  Medication Sig  . atorvastatin (LIPITOR) 80 MG tablet Take 1 tablet (80 mg total) by mouth daily at 6 PM.  . Cholecalciferol (VITAMIN D) 2000 units tablet Take 2,000 Units by mouth every other day.   . Collagen-Boron-Hyaluronic Acid (MOVE  FREE ULTRA JOINT HEALTH PO) Take 2 tablets by mouth daily.   Marland Kitchen docusate sodium (COLACE) 100 MG capsule Take 100 mg by mouth daily.  Marland Kitchen lisinopril (ZESTRIL) 2.5 MG tablet Take 1 tablet (2.5 mg total) by mouth daily.  . metoprolol succinate (TOPROL-XL) 50 MG 24 hr tablet Take 0.5 tablets (25 mg total) by mouth daily. Hold if heart rate is <60 bpm  . nitroGLYCERIN (NITROSTAT) 0.4 MG SL tablet Place 1 tablet (0.4 mg total) under the tongue every 5 (five) minutes x 3 doses as needed for chest pain.  . Rivaroxaban (XARELTO) 15 MG TABS tablet Take 1 tablet (15 mg total) by mouth daily.     Allergies:   Patient has no known allergies.   Social History   Socioeconomic History  . Marital status: Married    Spouse name: [name]  . Number of children: 1  . Years of education: college  . Highest education level: Associate degree: academic program  Occupational History  . Occupation: retired  Tobacco Use  . Smoking status: Never Smoker  . Smokeless tobacco: Never Used  Vaping Use  . Vaping Use: Never used  Substance and Sexual Activity  . Alcohol use: No    Alcohol/week: 0.0 standard drinks  . Drug use: No  . Sexual activity: Not on file  Other Topics Concern  . Not on file  Social History Narrative  . Not on file   Social Determinants of Health   Financial Resource Strain: Not on file  Food Insecurity: Not on file  Transportation Needs: Not on file  Physical Activity: Not on file  Stress: Not on file  Social Connections: Not on file     Family History: The patient's family history includes Alzheimer's disease in her brother and mother; Diabetes in her mother; Heart attack (age of onset: 22) in her father; Heart disease in her father. ROS:   Please see the history of present illness.    All 14 point review of systems negative except as described per history of present illness  EKGs/Labs/Other Studies Reviewed:      Recent Labs: 01/25/2020: Hemoglobin 13.9; Platelets  204 08/25/2020: BUN 15; Creatinine, Ser 1.07; Potassium 4.5; Sodium 140  Recent Lipid Panel    Component Value Date/Time   CHOL 153 08/25/2020 1056   TRIG 58 08/25/2020 1056   HDL 64 08/25/2020 1056   CHOLHDL 2.4 08/25/2020 1056   CHOLHDL 2.6 11/02/2019 0344   VLDL 11 11/02/2019 0344   LDLCALC 77 08/25/2020 1056    Physical Exam:    VS:  BP 126/84 (BP Location: Right Arm, Patient Position: Sitting)   Pulse 60   Ht 5\' 4"  (1.626 m)   Wt 239 lb (108.4 kg)   SpO2 94%   BMI 41.02 kg/m     Wt Readings from Last 3 Encounters:  12/11/20 239 lb (108.4 kg)  08/25/20 235 lb (106.6 kg)  03/21/20 226 lb (102.5 kg)     GEN:  Well nourished, well developed in no acute distress HEENT: Normal NECK: No JVD; No carotid bruits LYMPHATICS: No lymphadenopathy CARDIAC: RRR, no murmurs, no rubs, no gallops RESPIRATORY:  Clear to auscultation without rales, wheezing or rhonchi  ABDOMEN: Soft, non-tender, non-distended MUSCULOSKELETAL:  No edema; No deformity  SKIN: Warm and dry LOWER EXTREMITIES: no swelling NEUROLOGIC:  Alert and oriented x 3 PSYCHIATRIC:  Normal affect   ASSESSMENT:    1. Sinus bradycardia   2. Paroxysmal atrial fibrillation (HCC)   3. CAD S/P percutaneous coronary angioplasty   4. Ischemic cardiomyopathy   5. Dyslipidemia, goal LDL below 70    PLAN:    In order of problems listed above:  1. Sinus bradycardia with episode of fatigue and tiredness difficult to assess if this is related to bradycardia or not.  She check her pulse at the time of episode of fatigue and tiredness and heart rate was 60.  Still I think there is some value of putting monitor on her for about a week to see if she got any significant bradycardia paroxysmal atrial fibrillation. 2. Paroxysmal atrial fibrillation she is in normal rhythm today.  She is on Xarelto which I will continue. 3. Coronary disease status post PCI and stenting in January 2021.  Asymptomatic we will continue present  management which include antiplatelets therapy as well as statin. 4. History of ischemic cardiomyopathy with normal ejection fraction last time.  We will recheck her left ventricle ejection fraction by doing echocardiogram. 5. Dyslipidemia I did review her K PN from November of last year LDL 77 HDL 64.  She is taking high intensity statin form of Lipitor 80 which I will continue.   Medication Adjustments/Labs and Tests Ordered: Current medicines are reviewed at length with the patient today.  Concerns regarding medicines are outlined above.  No orders of the defined types were placed in this encounter.  Medication changes: No orders of the defined types were placed in this encounter.   Signed, Park Liter, MD, [facility] 12/11/2020 10:29 AM    [place]

## 2020-12-11 NOTE — Addendum Note (Signed)
Addended by: Senaida Ores on: 12/11/2020 10:37 AM   Modules accepted: Orders

## 2020-12-14 ENCOUNTER — Other Ambulatory Visit: Payer: Self-pay | Admitting: Cardiology

## 2020-12-15 ENCOUNTER — Telehealth: Payer: Self-pay | Admitting: Pharmacist

## 2020-12-15 NOTE — Telephone Encounter (Signed)
Pt qualifies for Xarelto 20mg  daily dosing for afib based on CrCl of 60mL/min. Renal function for DOAC dosing is calculated using actual body weight.  Pt used to take Xarelto 20mg  daily, looks as though dose was decreased 06/05/20 phone note with no explanation why. Forwarded to MD for input in separate phone note.

## 2020-12-15 NOTE — Telephone Encounter (Signed)
25f, 108.4kg, scr 1.07 08/25/20, lovw/krasowki 12/11/20, ccr 91.  Pt requesting refill of xarelto 15mg  when they qualify for 20mg  xarelto. Will route to pharmd pool

## 2020-12-15 NOTE — Telephone Encounter (Signed)
You 1 minute ago (3:12 PM)     Pt qualifies for Xarelto 20mg  daily dosing for afib based on CrCl of 81mL/min. Renal function for DOAC dosing is calculated using actual body weight.  Pt used to take Xarelto 20mg  daily, looks as though dose was decreased 06/05/20 phone note with no explanation why. Will forward to MD for input.      Documentation     Allean Found, CMA routed conversation to [facility] Pharmd 6 hours ago (9:01 AM)     70f, 108.4kg, scr 1.07 08/25/20, lovw/krasowki 12/11/20, ccr 91.  Pt requesting refill of xarelto 15mg  when they qualify for 20mg  xarelto. Will route to pharmd pool

## 2020-12-16 MED ORDER — RIVAROXABAN 20 MG PO TABS: 20.0000 mg | ORAL_TABLET | Freq: Every day | ORAL | 11 refills | Status: DC

## 2020-12-16 NOTE — Telephone Encounter (Signed)
Called and spoke w/pt regarding the dose change to 20mg  xarelto and the pt voiced understanding

## 2020-12-16 NOTE — Telephone Encounter (Signed)
Per Dr Agustin Cree, ok for pt to increase Xarelto back to 20mg  daily if you can send in refill for 20mg  dosing and let pt know, thanks!

## 2020-12-16 NOTE — Telephone Encounter (Signed)
Her creatinine clearance at that time was 48 and then 51 that is the reason why she was on 15 mg of Xarelto, now it looks like there is improvement in kidney function and its should be 20 mg of Xarelto daily

## 2020-12-19 DIAGNOSIS — I480 Paroxysmal atrial fibrillation: Secondary | ICD-10-CM | POA: Diagnosis not present

## 2020-12-19 DIAGNOSIS — R001 Bradycardia, unspecified: Secondary | ICD-10-CM | POA: Diagnosis not present

## 2020-12-23 DIAGNOSIS — R001 Bradycardia, unspecified: Secondary | ICD-10-CM | POA: Diagnosis not present

## 2020-12-23 DIAGNOSIS — I480 Paroxysmal atrial fibrillation: Secondary | ICD-10-CM | POA: Diagnosis not present

## 2020-12-23 DIAGNOSIS — I2510 Atherosclerotic heart disease of native coronary artery without angina pectoris: Secondary | ICD-10-CM | POA: Diagnosis not present

## 2020-12-23 DIAGNOSIS — Z9861 Coronary angioplasty status: Secondary | ICD-10-CM | POA: Diagnosis not present

## 2020-12-25 DIAGNOSIS — I480 Paroxysmal atrial fibrillation: Secondary | ICD-10-CM | POA: Diagnosis not present

## 2020-12-25 DIAGNOSIS — I10 Essential (primary) hypertension: Secondary | ICD-10-CM | POA: Diagnosis not present

## 2020-12-25 DIAGNOSIS — M1611 Unilateral primary osteoarthritis, right hip: Secondary | ICD-10-CM | POA: Diagnosis not present

## 2020-12-25 DIAGNOSIS — E782 Mixed hyperlipidemia: Secondary | ICD-10-CM | POA: Diagnosis not present

## 2020-12-25 DIAGNOSIS — I2510 Atherosclerotic heart disease of native coronary artery without angina pectoris: Secondary | ICD-10-CM | POA: Diagnosis not present

## 2020-12-25 DIAGNOSIS — I255 Ischemic cardiomyopathy: Secondary | ICD-10-CM | POA: Diagnosis not present

## 2020-12-25 DIAGNOSIS — N183 Chronic kidney disease, stage 3 unspecified: Secondary | ICD-10-CM | POA: Diagnosis not present

## 2020-12-26 ENCOUNTER — Telehealth: Payer: Self-pay

## 2020-12-26 NOTE — Telephone Encounter (Signed)
Patient notified of results and verbalized understanding.  

## 2020-12-26 NOTE — Telephone Encounter (Signed)
-----   Message from Park Liter, MD sent at 12/26/2020 11:36 AM EDT ----- Multiple episode of SVT but asymptomatic, no need to treat unless if patient is symptomatic.

## 2021-01-01 DIAGNOSIS — I255 Ischemic cardiomyopathy: Secondary | ICD-10-CM | POA: Diagnosis not present

## 2021-01-01 DIAGNOSIS — N183 Chronic kidney disease, stage 3 unspecified: Secondary | ICD-10-CM | POA: Diagnosis not present

## 2021-01-01 DIAGNOSIS — E559 Vitamin D deficiency, unspecified: Secondary | ICD-10-CM | POA: Diagnosis not present

## 2021-01-01 DIAGNOSIS — Z0000 Encounter for general adult medical examination without abnormal findings: Secondary | ICD-10-CM | POA: Diagnosis not present

## 2021-01-01 DIAGNOSIS — I10 Essential (primary) hypertension: Secondary | ICD-10-CM | POA: Diagnosis not present

## 2021-01-01 DIAGNOSIS — E782 Mixed hyperlipidemia: Secondary | ICD-10-CM | POA: Diagnosis not present

## 2021-01-01 DIAGNOSIS — Z79899 Other long term (current) drug therapy: Secondary | ICD-10-CM | POA: Diagnosis not present

## 2021-01-09 DIAGNOSIS — I480 Paroxysmal atrial fibrillation: Secondary | ICD-10-CM | POA: Diagnosis not present

## 2021-01-09 DIAGNOSIS — M1611 Unilateral primary osteoarthritis, right hip: Secondary | ICD-10-CM | POA: Diagnosis not present

## 2021-01-09 DIAGNOSIS — I2510 Atherosclerotic heart disease of native coronary artery without angina pectoris: Secondary | ICD-10-CM | POA: Diagnosis not present

## 2021-01-09 DIAGNOSIS — E782 Mixed hyperlipidemia: Secondary | ICD-10-CM | POA: Diagnosis not present

## 2021-01-09 DIAGNOSIS — I10 Essential (primary) hypertension: Secondary | ICD-10-CM | POA: Diagnosis not present

## 2021-01-09 DIAGNOSIS — I255 Ischemic cardiomyopathy: Secondary | ICD-10-CM | POA: Diagnosis not present

## 2021-01-09 DIAGNOSIS — N183 Chronic kidney disease, stage 3 unspecified: Secondary | ICD-10-CM | POA: Diagnosis not present

## 2021-01-19 ENCOUNTER — Ambulatory Visit (HOSPITAL_BASED_OUTPATIENT_CLINIC_OR_DEPARTMENT_OTHER)
Admission: RE | Admit: 2021-01-19 | Discharge: 2021-01-19 | Disposition: A | Discharge status: Home / Self Care | Payer: Medicare Other | Source: Ambulatory Visit | Attending: Cardiology | Admitting: Cardiology

## 2021-01-19 ENCOUNTER — Other Ambulatory Visit: Payer: Self-pay

## 2021-01-19 DIAGNOSIS — I2510 Atherosclerotic heart disease of native coronary artery without angina pectoris: Secondary | ICD-10-CM | POA: Diagnosis not present

## 2021-01-19 DIAGNOSIS — I255 Ischemic cardiomyopathy: Secondary | ICD-10-CM | POA: Diagnosis not present

## 2021-01-19 DIAGNOSIS — I480 Paroxysmal atrial fibrillation: Secondary | ICD-10-CM | POA: Diagnosis not present

## 2021-01-19 DIAGNOSIS — R001 Bradycardia, unspecified: Secondary | ICD-10-CM | POA: Diagnosis not present

## 2021-01-19 DIAGNOSIS — Z9861 Coronary angioplasty status: Secondary | ICD-10-CM | POA: Insufficient documentation

## 2021-01-19 DIAGNOSIS — E785 Hyperlipidemia, unspecified: Secondary | ICD-10-CM | POA: Insufficient documentation

## 2021-01-19 LAB — ECHOCARDIOGRAM COMPLETE
AV Mean grad: 5 mmHg
AV Peak grad: 8.9 mmHg
Ao pk vel: 1.49 m/s
Area-P 1/2: 1.92 cm2

## 2021-01-20 ENCOUNTER — Telehealth: Payer: Self-pay

## 2021-01-20 NOTE — Telephone Encounter (Signed)
Patient notified of test results 

## 2021-01-20 NOTE — Telephone Encounter (Signed)
-----   Message from Park Liter, MD sent at 01/20/2021  9:02 AM EDT ----- Echocardiogram showed normal left ventricle ejection fraction, overall looks good

## 2021-02-03 DIAGNOSIS — R7989 Other specified abnormal findings of blood chemistry: Secondary | ICD-10-CM | POA: Diagnosis not present

## 2021-02-03 DIAGNOSIS — R748 Abnormal levels of other serum enzymes: Secondary | ICD-10-CM

## 2021-02-03 HISTORY — DX: Abnormal levels of other serum enzymes: R74.8

## 2021-02-27 ENCOUNTER — Ambulatory Visit: Payer: Medicare Other | Admitting: Cardiology

## 2021-03-03 DIAGNOSIS — R7989 Other specified abnormal findings of blood chemistry: Secondary | ICD-10-CM | POA: Diagnosis not present

## 2021-03-05 ENCOUNTER — Other Ambulatory Visit (HOSPITAL_COMMUNITY): Payer: Self-pay | Admitting: Family Medicine

## 2021-03-05 ENCOUNTER — Other Ambulatory Visit: Payer: Self-pay | Admitting: Family Medicine

## 2021-03-05 DIAGNOSIS — R7989 Other specified abnormal findings of blood chemistry: Secondary | ICD-10-CM

## 2021-03-09 DIAGNOSIS — R7989 Other specified abnormal findings of blood chemistry: Secondary | ICD-10-CM | POA: Diagnosis not present

## 2021-03-10 ENCOUNTER — Other Ambulatory Visit: Payer: Self-pay

## 2021-03-10 ENCOUNTER — Ambulatory Visit (HOSPITAL_BASED_OUTPATIENT_CLINIC_OR_DEPARTMENT_OTHER)
Admission: RE | Admit: 2021-03-10 | Discharge: 2021-03-10 | Disposition: A | Discharge status: Home / Self Care | Payer: Medicare Other | Source: Ambulatory Visit | Attending: Family Medicine | Admitting: Family Medicine

## 2021-03-10 DIAGNOSIS — R7989 Other specified abnormal findings of blood chemistry: Secondary | ICD-10-CM | POA: Insufficient documentation

## 2021-03-10 DIAGNOSIS — K760 Fatty (change of) liver, not elsewhere classified: Secondary | ICD-10-CM | POA: Diagnosis not present

## 2021-03-13 ENCOUNTER — Other Ambulatory Visit (HOSPITAL_COMMUNITY): Payer: Self-pay | Admitting: Family Medicine

## 2021-03-13 ENCOUNTER — Other Ambulatory Visit: Payer: Self-pay | Admitting: Family Medicine

## 2021-03-13 DIAGNOSIS — R7989 Other specified abnormal findings of blood chemistry: Secondary | ICD-10-CM

## 2021-03-13 DIAGNOSIS — R932 Abnormal findings on diagnostic imaging of liver and biliary tract: Secondary | ICD-10-CM

## 2021-03-17 DIAGNOSIS — I255 Ischemic cardiomyopathy: Secondary | ICD-10-CM | POA: Diagnosis not present

## 2021-03-17 DIAGNOSIS — I10 Essential (primary) hypertension: Secondary | ICD-10-CM | POA: Diagnosis not present

## 2021-03-17 DIAGNOSIS — N183 Chronic kidney disease, stage 3 unspecified: Secondary | ICD-10-CM | POA: Diagnosis not present

## 2021-03-17 DIAGNOSIS — I480 Paroxysmal atrial fibrillation: Secondary | ICD-10-CM | POA: Diagnosis not present

## 2021-03-17 DIAGNOSIS — I2510 Atherosclerotic heart disease of native coronary artery without angina pectoris: Secondary | ICD-10-CM | POA: Diagnosis not present

## 2021-03-17 DIAGNOSIS — M1611 Unilateral primary osteoarthritis, right hip: Secondary | ICD-10-CM | POA: Diagnosis not present

## 2021-03-17 DIAGNOSIS — E782 Mixed hyperlipidemia: Secondary | ICD-10-CM | POA: Diagnosis not present

## 2021-03-23 ENCOUNTER — Other Ambulatory Visit: Payer: Self-pay

## 2021-03-23 ENCOUNTER — Ambulatory Visit (HOSPITAL_COMMUNITY)
Admission: RE | Admit: 2021-03-23 | Discharge: 2021-03-23 | Disposition: A | Discharge status: Home / Self Care | Payer: Medicare Other | Source: Ambulatory Visit | Attending: Family Medicine | Admitting: Family Medicine

## 2021-03-23 DIAGNOSIS — R7989 Other specified abnormal findings of blood chemistry: Secondary | ICD-10-CM | POA: Diagnosis not present

## 2021-03-23 DIAGNOSIS — R932 Abnormal findings on diagnostic imaging of liver and biliary tract: Secondary | ICD-10-CM | POA: Diagnosis not present

## 2021-03-23 DIAGNOSIS — K8689 Other specified diseases of pancreas: Secondary | ICD-10-CM | POA: Diagnosis not present

## 2021-03-23 MED ORDER — GADOBUTROL 1 MMOL/ML IV SOLN
10.0000 mL | Freq: Once | INTRAVENOUS | Status: AC | PRN
  Administered 2021-03-23: 10 mL via INTRAVENOUS

## 2021-03-26 ENCOUNTER — Telehealth: Payer: Self-pay | Admitting: *Deleted

## 2021-03-26 NOTE — Telephone Encounter (Signed)
Per referral Dr. Leonides Schanz - called and gave upcoming appointments - patient confirmed - mailed welcome packet with calendar

## 2021-04-20 ENCOUNTER — Encounter: Payer: Self-pay | Admitting: Cardiology

## 2021-04-20 ENCOUNTER — Ambulatory Visit (INDEPENDENT_AMBULATORY_CARE_PROVIDER_SITE_OTHER): Payer: Medicare Other | Admitting: Cardiology

## 2021-04-20 ENCOUNTER — Other Ambulatory Visit: Payer: Self-pay

## 2021-04-20 VITALS — BP 134/74 | HR 61 | Ht 63.5 in | Wt 234.0 lb

## 2021-04-20 DIAGNOSIS — I480 Paroxysmal atrial fibrillation: Secondary | ICD-10-CM | POA: Diagnosis not present

## 2021-04-20 DIAGNOSIS — I2510 Atherosclerotic heart disease of native coronary artery without angina pectoris: Secondary | ICD-10-CM

## 2021-04-20 DIAGNOSIS — I255 Ischemic cardiomyopathy: Secondary | ICD-10-CM | POA: Diagnosis not present

## 2021-04-20 DIAGNOSIS — I10 Essential (primary) hypertension: Secondary | ICD-10-CM | POA: Diagnosis not present

## 2021-04-20 DIAGNOSIS — R7989 Other specified abnormal findings of blood chemistry: Secondary | ICD-10-CM | POA: Diagnosis not present

## 2021-04-20 DIAGNOSIS — Z9861 Coronary angioplasty status: Secondary | ICD-10-CM | POA: Diagnosis not present

## 2021-04-20 DIAGNOSIS — E782 Mixed hyperlipidemia: Secondary | ICD-10-CM | POA: Diagnosis not present

## 2021-04-20 MED ORDER — ATORVASTATIN CALCIUM 80 MG PO TABS: 80.0000 mg | ORAL_TABLET | Freq: Every day | ORAL | 3 refills | Status: DC

## 2021-04-20 MED ORDER — NITROGLYCERIN 0.4 MG SL SUBL: 0.4000 mg | SUBLINGUAL_TABLET | SUBLINGUAL | 3 refills | Status: DC | PRN

## 2021-04-20 MED ORDER — LISINOPRIL 2.5 MG PO TABS: 2.5000 mg | ORAL_TABLET | Freq: Every day | ORAL | 3 refills | Status: DC

## 2021-04-20 NOTE — Progress Notes (Signed)
Cardiology Office Note:    Date:  04/20/2021   ID:  Melissa Clements, DOB 26-Dec-1945, MRN [id number]  PCP:  [name], MD  Cardiologist:  Jenne Campus, MD    Referring MD: [name], MD   Chief Complaint  Patient presents with   Medication Management  I have liver function test abnormalities  History of Present Illness:    Melissa Clements is a 75 y.o. female with past medical history significant for coronary artery disease, status post PTCA and stenting of the left anterior descending artery in face of late presentation of myocardial infarction.  She also have history of dyslipidemia, paroxysmal atrial fibrillation, she is on Xarelto, cardiomyopathy however latest echocardiogram showed improvement left ventricle ejection fraction to normalization.  Recently she has been discovered to have abnormality of the liver function test apparently that was checked at the beginning of May and then test has been repeated at the end of May and apparently got worse.  She did have some test done on her abdomen to rule out fatty liver that was negative.  She was eventually referred to hematologist?  To talk about that that issue.  She is cardiac wise doing well.  There is no chest pain tightness squeezing pressure burning chest no palpitations no dizziness no episodes of profound fatigue and some weakness that she cannot do before.  Past Medical History:  Diagnosis Date   Arthritis    end stage right hip   Basal cell carcinoma    Benign essential HTN 01/23/2015   CAD S/P percutaneous coronary angioplasty 11/06/2019   LAD PCI with DES 11/02/2019   Chronic kidney disease    stage III, related to high blood pressure medication   Colon polyp    CRI (chronic renal insufficiency), stage 3 (moderate) (HCC) 11/06/2019   No ACE or ARB   Depression    Dizziness 01/23/2015   Dyslipidemia, goal LDL below 70 11/06/2019   LDL 84- changed from Zocor to Lipitor 80 mg   Dyspnea on exertion 11/01/2019    Hematoma 11/06/2019   Hematoma and ecchymosis Rt radial site post PCI-(anticoagulation for new AF held).    History of chest pain    History of dizziness    History of palpitations    Hyperlipidemia    Hypertension    Ischemic cardiomyopathy 11/06/2019   EF 45%   NSTEMI (non-ST elevated myocardial infarction) ([place]) 11/01/2019   S/P NSTEMI 11/03/2019   Obese 01/25/2018   Palpitations 01/23/2015   Paroxysmal atrial fibrillation ([place]) 08/25/2020   Persistent atrial fibrillation ([place])    New diagnosis 11/03/2019- not anticoagulated yet- minimal symptoms- rate controlled   Precordial chest pain 01/23/2015   Negative stress test in the spring 2019   S/P right THA, AA 01/24/2018   Sinus bradycardia 01/20/2018    Past Surgical History:  Procedure Laterality Date   CARDIAC CATHETERIZATION     CARDIOVERSION N/A 01/29/2020   Procedure: CARDIOVERSION;  Surgeon: [name], MD;  Location: [place];  Service: Cardiovascular;  Laterality: N/A;   CATARACT EXTRACTION, BILATERAL     COLONOSCOPY     CORONARY STENT INTERVENTION N/A 11/02/2019   Procedure: CORONARY STENT INTERVENTION;  Surgeon: Leonie Man, MD;  Location: [place] [facility];  Service: Cardiovascular;  Laterality: N/A;   DIAGNOSTIC LAPAROSCOPY     DILATION AND CURETTAGE OF UTERUS     LEFT HEART CATH AND CORONARY ANGIOGRAPHY N/A 11/02/2019   Procedure: LEFT HEART CATH AND CORONARY ANGIOGRAPHY;  Surgeon: Ellyn Hack,  Leonie Green, MD;  Location: [place] [facility];  Service: Cardiovascular;  Laterality: N/A;   TOTAL HIP ARTHROPLASTY Right 01/24/2018   Procedure: RIGHT TOTAL HIP ARTHROPLASTY ANTERIOR APPROACH;  Surgeon: Paralee Cancel, MD;  [facility];  Service: Orthopedics;  Laterality: Right;  70 mins    Current Medications: Current Meds  Medication Sig   atorvastatin (LIPITOR) 80 MG tablet Take 1 tablet (80 mg total) by mouth daily at 6 PM.   Cholecalciferol (VITAMIN D) 2000 units tablet Take 2,000 Units by mouth every other day.     Collagen-Boron-Hyaluronic Acid (MOVE FREE ULTRA JOINT HEALTH PO) Take 2 tablets by mouth daily. Unknown strenght   docusate sodium (COLACE) 100 MG capsule Take 100 mg by mouth daily.   lisinopril (ZESTRIL) 2.5 MG tablet Take 1 tablet (2.5 mg total) by mouth daily.   metoprolol succinate (TOPROL-XL) 50 MG 24 hr tablet Take 0.5 tablets (25 mg total) by mouth daily. Hold if heart rate is <60 bpm   nitroGLYCERIN (NITROSTAT) 0.4 MG SL tablet Place 1 tablet (0.4 mg total) under the tongue every 5 (five) minutes x 3 doses as needed for chest pain.   rivaroxaban (XARELTO) 20 MG TABS tablet Take 1 tablet (20 mg total) by mouth daily with supper.     Allergies:   Patient has no known allergies.   Social History   Socioeconomic History   Marital status: Married    Spouse name: [name]   Number of children: 1   Years of education: college   Highest education level: Associate degree: academic program  Occupational History   Occupation: retired  Tobacco Use   Smoking status: Never   Smokeless tobacco: Never  Scientific laboratory technician Use: Never used  Substance and Sexual Activity   Alcohol use: No    Alcohol/week: 0.0 standard drinks   Drug use: No   Sexual activity: Not on file  Other Topics Concern   Not on file  Social History Narrative   Not on file   Social Determinants of Health   Financial Resource Strain: Not on file  Food Insecurity: Not on file  Transportation Needs: Not on file  Physical Activity: Not on file  Stress: Not on file  Social Connections: Not on file     Family History: The patient's family history includes Alzheimer's disease in her brother and mother; Diabetes in her mother; Heart attack (age of onset: 67) in her father; Heart disease in her father. ROS:   Please see the history of present illness.    All 14 point review of systems negative except as described per history of present illness  EKGs/Labs/Other Studies Reviewed:      Recent Labs: 08/25/2020:  BUN 15; Creatinine, Ser 1.07; Potassium 4.5; Sodium 140  Recent Lipid Panel    Component Value Date/Time   CHOL 153 08/25/2020 1056   TRIG 58 08/25/2020 1056   HDL 64 08/25/2020 1056   CHOLHDL 2.4 08/25/2020 1056   CHOLHDL 2.6 11/02/2019 0344   VLDL 11 11/02/2019 0344   LDLCALC 77 08/25/2020 1056    Physical Exam:    VS:  BP 134/74 (BP Location: Right Arm, Patient Position: Sitting)   Pulse 61   Ht 5' 3.5" (1.613 m)   Wt 234 lb (106.1 kg)   SpO2 97%   BMI 40.80 kg/m     Wt Readings from Last 3 Encounters:  04/20/21 234 lb (106.1 kg)  12/11/20 239 lb (108.4 kg)  08/25/20 235 lb (106.6  kg)     GEN:  Well nourished, well developed in no acute distress HEENT: Normal NECK: No JVD; No carotid bruits LYMPHATICS: No lymphadenopathy CARDIAC: RRR, no murmurs, no rubs, no gallops RESPIRATORY:  Clear to auscultation without rales, wheezing or rhonchi  ABDOMEN: Soft, non-tender, non-distended MUSCULOSKELETAL:  No edema; No deformity  SKIN: Warm and dry LOWER EXTREMITIES: no swelling NEUROLOGIC:  Alert and oriented x 3 PSYCHIATRIC:  Normal affect   ASSESSMENT:    1. CAD S/P percutaneous coronary angioplasty   2. Paroxysmal atrial fibrillation (HCC)   3. Primary hypertension   4. Ischemic cardiomyopathy   5. Mixed hyperlipidemia    PLAN:    In order of problems listed above:  Status post coronary artery stenting of the proximal LAD in the face of a late presentation of myocardial infarction.  She is on appropriate medications.  She is on Xarelto, she is off aspirin since that being more than 12 months since intervention.  Continue present management seems to be stable from that aspect. Paroxysmal atrial fibrillation denies having palpitations.  She is on Xarelto which I will continue.  No antiarrhythmic therapy only beta-blocker. Ischemic cardiomyopathy she is on beta-blocker and ACE inhibitor last echocardiogram showed preserved left ventricle ejection fraction.  We will  continue present management. Dyslipidemia she is on Lipitor 80, however she was noted to have abnormality of the liver function test.  I will check her cholesterol today, I will also check AST ALT.  We may be forced to cut down or maybe even discontinue cholesterol medication.  If AST ALT is 3 times more than normal will cut down the dose if AST ALT up 5 times more than normal then will discontinue temporarily at least Lipitor.  She does not drink alcohol she does not abuse Tylenol.  Testing done for liver so far and unrevealing meaning there is no problem with a fatty liver.   Medication Adjustments/Labs and Tests Ordered: Current medicines are reviewed at length with the patient today.  Concerns regarding medicines are outlined above.  No orders of the defined types were placed in this encounter.  Medication changes: No orders of the defined types were placed in this encounter.   Signed, Park Liter, MD, [facility] 04/20/2021 10:03 AM    [place]

## 2021-04-20 NOTE — Addendum Note (Signed)
Addended by: Orvan July on: 04/20/2021 10:17 AM   Modules accepted: Orders

## 2021-04-20 NOTE — Patient Instructions (Signed)
Medication Instructions:  Your physician recommends that you continue on your current medications as directed. Please refer to the Current Medication list given to you today.  *If you need a refill on your cardiac medications before your next appointment, please call your pharmacy*   Lab Work: Your physician recommends that you return for lab work in:  TODAY: AST, ALT, LFTs If you have labs (blood work) drawn today and your tests are completely normal, you will receive your results only by: [place] (if you have MyChart) OR A paper copy in the mail If you have any lab test that is abnormal or we need to change your treatment, we will call you to review the results.   Testing/Procedures: None   Follow-Up: At [facility], you and your health needs are our priority.  As part of our continuing mission to provide you with exceptional heart care, we have created designated Provider Care Teams.  These Care Teams include your primary Cardiologist (physician) and Advanced Practice Providers (APPs -  Physician Assistants and Nurse Practitioners) who all work together to provide you with the care you need, when you need it.  We recommend signing up for the patient portal called "MyChart".  Sign up information is provided on this After Visit Summary.  MyChart is used to connect with patients for Virtual Visits (Telemedicine).  Patients are able to view lab/test results, encounter notes, upcoming appointments, etc.  Non-urgent messages can be sent to your provider as well.   To learn more about what you can do with MyChart, go to [email or web address].    Your next appointment:   6 month(s)  The format for your next appointment:   In Person  Provider:   Jenne Campus, MD   Other Instructions

## 2021-04-20 NOTE — Addendum Note (Signed)
Addended by: Orvan July on: 04/20/2021 10:25 AM   Modules accepted: Orders

## 2021-04-21 LAB — HEPATIC FUNCTION PANEL
ALT: 221 IU/L — ABNORMAL HIGH (ref 0–32)
AST: 311 IU/L — ABNORMAL HIGH (ref 0–40)
Albumin: 3.5 g/dL — ABNORMAL LOW (ref 3.7–4.7)
Alkaline Phosphatase: 183 IU/L — ABNORMAL HIGH (ref 44–121)
Bilirubin Total: 0.7 mg/dL (ref 0.0–1.2)
Bilirubin, Direct: 0.23 mg/dL (ref 0.00–0.40)
Total Protein: 6.3 g/dL (ref 6.0–8.5)

## 2021-04-21 LAB — LIPID PANEL
Chol/HDL Ratio: 2.2 ratio (ref 0.0–4.4)
Cholesterol, Total: 139 mg/dL (ref 100–199)
HDL: 62 mg/dL (ref >39)
LDL Chol Calc (NIH): 65 mg/dL (ref 0–99)
Triglycerides: 57 mg/dL (ref 0–149)
VLDL Cholesterol Cal: 12 mg/dL (ref 5–40)

## 2021-04-21 MED ORDER — ATORVASTATIN CALCIUM 80 MG PO TABS: 40.0000 mg | ORAL_TABLET | Freq: Every day | ORAL | 3 refills | Status: DC

## 2021-04-21 NOTE — Addendum Note (Signed)
Addended by: Truddie Hidden on: 04/21/2021 01:30 PM   Modules accepted: Orders

## 2021-04-28 ENCOUNTER — Other Ambulatory Visit: Payer: Self-pay | Admitting: Family

## 2021-04-28 DIAGNOSIS — R7989 Other specified abnormal findings of blood chemistry: Secondary | ICD-10-CM

## 2021-04-29 ENCOUNTER — Encounter: Payer: Self-pay | Admitting: Family

## 2021-04-29 ENCOUNTER — Telehealth: Payer: Self-pay | Admitting: *Deleted

## 2021-04-29 ENCOUNTER — Inpatient Hospital Stay: Payer: Medicare Other | Admitting: Family

## 2021-04-29 ENCOUNTER — Inpatient Hospital Stay: Payer: Medicare Other | Attending: Hematology & Oncology

## 2021-04-29 ENCOUNTER — Other Ambulatory Visit: Payer: Self-pay

## 2021-04-29 DIAGNOSIS — Z6841 Body Mass Index (BMI) 40.0 and over, adult: Secondary | ICD-10-CM | POA: Diagnosis not present

## 2021-04-29 DIAGNOSIS — Z818 Family history of other mental and behavioral disorders: Secondary | ICD-10-CM | POA: Diagnosis not present

## 2021-04-29 DIAGNOSIS — Z8719 Personal history of other diseases of the digestive system: Secondary | ICD-10-CM | POA: Diagnosis not present

## 2021-04-29 DIAGNOSIS — I2510 Atherosclerotic heart disease of native coronary artery without angina pectoris: Secondary | ICD-10-CM | POA: Insufficient documentation

## 2021-04-29 DIAGNOSIS — R7989 Other specified abnormal findings of blood chemistry: Secondary | ICD-10-CM | POA: Diagnosis not present

## 2021-04-29 DIAGNOSIS — Z7901 Long term (current) use of anticoagulants: Secondary | ICD-10-CM | POA: Insufficient documentation

## 2021-04-29 DIAGNOSIS — E669 Obesity, unspecified: Secondary | ICD-10-CM

## 2021-04-29 DIAGNOSIS — I255 Ischemic cardiomyopathy: Secondary | ICD-10-CM | POA: Insufficient documentation

## 2021-04-29 DIAGNOSIS — R001 Bradycardia, unspecified: Secondary | ICD-10-CM | POA: Insufficient documentation

## 2021-04-29 DIAGNOSIS — N183 Chronic kidney disease, stage 3 unspecified: Secondary | ICD-10-CM

## 2021-04-29 DIAGNOSIS — Z79899 Other long term (current) drug therapy: Secondary | ICD-10-CM | POA: Insufficient documentation

## 2021-04-29 DIAGNOSIS — Z833 Family history of diabetes mellitus: Secondary | ICD-10-CM

## 2021-04-29 DIAGNOSIS — I252 Old myocardial infarction: Secondary | ICD-10-CM | POA: Diagnosis not present

## 2021-04-29 DIAGNOSIS — I480 Paroxysmal atrial fibrillation: Secondary | ICD-10-CM | POA: Diagnosis not present

## 2021-04-29 DIAGNOSIS — Z85828 Personal history of other malignant neoplasm of skin: Secondary | ICD-10-CM | POA: Diagnosis not present

## 2021-04-29 DIAGNOSIS — Z8249 Family history of ischemic heart disease and other diseases of the circulatory system: Secondary | ICD-10-CM

## 2021-04-29 DIAGNOSIS — E785 Hyperlipidemia, unspecified: Secondary | ICD-10-CM | POA: Insufficient documentation

## 2021-04-29 LAB — CBC WITH DIFFERENTIAL (CANCER CENTER ONLY)
Abs Immature Granulocytes: 0.02 K/uL (ref 0.00–0.07)
Basophils Absolute: 0 K/uL (ref 0.0–0.1)
Basophils Relative: 1 %
Eosinophils Absolute: 0.3 K/uL (ref 0.0–0.5)
Eosinophils Relative: 5 %
HCT: 40.3 % (ref 36.0–46.0)
Hemoglobin: 13.2 g/dL (ref 12.0–15.0)
Immature Granulocytes: 0 %
Lymphocytes Relative: 33 %
Lymphs Abs: 1.8 K/uL (ref 0.7–4.0)
MCH: 30.7 pg (ref 26.0–34.0)
MCHC: 32.8 g/dL (ref 30.0–36.0)
MCV: 93.7 fL (ref 80.0–100.0)
Monocytes Absolute: 0.7 K/uL (ref 0.1–1.0)
Monocytes Relative: 12 %
Neutro Abs: 2.8 K/uL (ref 1.7–7.7)
Neutrophils Relative %: 49 %
Platelet Count: 131 K/uL — ABNORMAL LOW (ref 150–400)
RBC: 4.3 MIL/uL (ref 3.87–5.11)
RDW: 14.8 % (ref 11.5–15.5)
WBC Count: 5.6 K/uL (ref 4.0–10.5)
nRBC: 0 % (ref 0.0–0.2)

## 2021-04-29 LAB — CMP (CANCER CENTER ONLY)
ALT: 152 U/L — ABNORMAL HIGH (ref 0–44)
AST: 204 U/L (ref 15–41)
Albumin: 3.5 g/dL (ref 3.5–5.0)
Alkaline Phosphatase: 142 U/L — ABNORMAL HIGH (ref 38–126)
Anion gap: 8 (ref 5–15)
BUN: 31 mg/dL — ABNORMAL HIGH (ref 8–23)
CO2: 23 mmol/L (ref 22–32)
Calcium: 9.6 mg/dL (ref 8.9–10.3)
Chloride: 105 mmol/L (ref 98–111)
Creatinine: 1.17 mg/dL — ABNORMAL HIGH (ref 0.44–1.00)
GFR, Estimated: 49 mL/min — ABNORMAL LOW
Glucose, Bld: 111 mg/dL — ABNORMAL HIGH (ref 70–99)
Potassium: 4.3 mmol/L (ref 3.5–5.1)
Sodium: 136 mmol/L (ref 135–145)
Total Bilirubin: 0.8 mg/dL (ref 0.3–1.2)
Total Protein: 6.7 g/dL (ref 6.5–8.1)

## 2021-04-29 LAB — LACTATE DEHYDROGENASE: LDH: 274 U/L — ABNORMAL HIGH (ref 98–192)

## 2021-04-29 LAB — RETICULOCYTES
Immature Retic Fract: 5.4 % (ref 2.3–15.9)
RBC.: 4.32 MIL/uL (ref 3.87–5.11)
Retic Count, Absolute: 63.1 10*3/uL (ref 19.0–186.0)
Retic Ct Pct: 1.5 % (ref 0.4–3.1)

## 2021-04-29 LAB — SAVE SMEAR(SSMR), FOR PROVIDER SLIDE REVIEW

## 2021-04-29 NOTE — Progress Notes (Signed)
Hematology/Oncology Consultation   Name: Melissa Clements      MRN: [id number]    Location: Room/bed info not found  Date: 04/29/2021 Time:3:33 PM   REFERRING PHYSICIAN: [name], MD  REASON FOR CONSULT: Further evaluation for hemochromatosis, elevated LFT's   DIAGNOSIS: Hemochromatosis, heterozygous for the C282Y mutation  HISTORY OF PRESENT ILLNESS: [name] is a very pleasant 75 yo caucasian female with recent diagnosis of hemochromatosis and ferritin level of 370 with her PCP.  She is symptomatic with fatigue.  She states that she feels her mother also had hemochromatosis due to discoloration/hyperpigmentation of the skin around her ankles.  LFT's were noted to be significantly elevated after starting Lipitor. Her counts were monitored closely by cardiology and last week her doe was decreased. Today's LFT's are improved suggesting the Lipitor is the cause. Labs routed to her cardiologist Dr. Agustin Cree.  MRI of the liver on 03/24/2021 was negative for hepatic steatosis or hepatic lesion.  No jaundice noted.  She denies Tylenol use.  She had a total right hip replacement without any complications.  Her father and brother both had history of heart disease.  No personal or known familial history of thrombotic event.  She is currently on Xarelto. She was diagnosed with atrial fib in 2021 and was cardioverted. She states that now she stays bradycardic. HR at this time is 59, BP 112/46.  No history of diabetes or thyroid disease.  She had a basal cell carcinoma removed from her face. Her father also had history of skin cancer.  No fever, chills, n/v, cough, rash, dizziness, SOB, chest pain, palpitations, abdominal pain or changes in bowel or bladder habits.  No episodes of bleeding. No abnormal bruising, no petechiae.  She has puffiness in her feet that comes and goes. She notes this when she sits for an extended period of time and improves when she props up her feet.  No falls or syncope  to report.  No smoking, ETOH or recreational drug use.  She has maintained a good appetite and is doing her best to stay well hydrated. Her weight is stable at 237 lbs.  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   ROS: All other 10 point review of systems is negative.   PAST MEDICAL HISTORY:   Past Medical History:  Diagnosis Date   Arthritis    end stage right hip   Basal cell carcinoma    Benign essential HTN 01/23/2015   CAD S/P percutaneous coronary angioplasty 11/06/2019   LAD PCI with DES 11/02/2019   Chronic kidney disease    stage III, related to high blood pressure medication   Colon polyp    CRI (chronic renal insufficiency), stage 3 (moderate) (HCC) 11/06/2019   No ACE or ARB   Depression    Dizziness 01/23/2015   Dyslipidemia, goal LDL below 70 11/06/2019   LDL 84- changed from Zocor to Lipitor 80 mg   Dyspnea on exertion 11/01/2019   Hematoma 11/06/2019   Hematoma and ecchymosis Rt radial site post PCI-(anticoagulation for new AF held).    History of chest pain    History of dizziness    History of palpitations    Hyperlipidemia    Hypertension    Ischemic cardiomyopathy 11/06/2019   EF 45%   NSTEMI (non-ST elevated myocardial infarction) ([facility]) 11/01/2019   S/P NSTEMI 11/03/2019   Obese 01/25/2018  Palpitations 01/23/2015   Paroxysmal atrial fibrillation ([facility]) 08/25/2020   Persistent atrial fibrillation ([place])    New diagnosis 11/03/2019- not anticoagulated yet- minimal symptoms- rate controlled   Precordial chest pain 01/23/2015   Negative stress test in the spring 2019   S/P right THA, AA 01/24/2018   Sinus bradycardia 01/20/2018    ALLERGIES: No Known Allergies    MEDICATIONS:  Current Outpatient Medications on File Prior to Visit  Medication Sig Dispense Refill   atorvastatin (LIPITOR) 80 MG tablet Take 0.5 tablets (40 mg total) by mouth daily at 6 PM. 90 tablet 3    Cholecalciferol (VITAMIN D) 2000 units tablet Take 2,000 Units by mouth every other day.      Collagen-Boron-Hyaluronic Acid (MOVE FREE ULTRA JOINT HEALTH PO) Take 2 tablets by mouth daily. Unknown strenght     docusate sodium (COLACE) 100 MG capsule Take 100 mg by mouth daily.     lisinopril (ZESTRIL) 2.5 MG tablet Take 1 tablet (2.5 mg total) by mouth daily. 90 tablet 3   metoprolol succinate (TOPROL-XL) 50 MG 24 hr tablet Take 0.5 tablets (25 mg total) by mouth daily. Hold if heart rate is <60 bpm 90 tablet 1   nitroGLYCERIN (NITROSTAT) 0.4 MG SL tablet Place 1 tablet (0.4 mg total) under the tongue every 5 (five) minutes x 3 doses as needed for chest pain. 25 tablet 3   rivaroxaban (XARELTO) 20 MG TABS tablet Take 1 tablet (20 mg total) by mouth daily with supper. 30 tablet 11   No current facility-administered medications on file prior to visit.     PAST SURGICAL HISTORY Past Surgical History:  Procedure Laterality Date   CARDIAC CATHETERIZATION     CARDIOVERSION N/A 01/29/2020   Procedure: CARDIOVERSION;  Surgeon: [name], MD;  Location: [facility];  Service: Cardiovascular;  Laterality: N/A;   CATARACT EXTRACTION, BILATERAL     COLONOSCOPY     CORONARY STENT INTERVENTION N/A 11/02/2019   Procedure: CORONARY STENT INTERVENTION;  Surgeon: Leonie Man, MD;  Location: [facility];  Service: Cardiovascular;  Laterality: N/A;   DIAGNOSTIC LAPAROSCOPY     DILATION AND CURETTAGE OF UTERUS     LEFT HEART CATH AND CORONARY ANGIOGRAPHY N/A 11/02/2019   Procedure: LEFT HEART CATH AND CORONARY ANGIOGRAPHY;  Surgeon: Leonie Man, MD;  Location: [facility];  Service: Cardiovascular;  Laterality: N/A;   TOTAL HIP ARTHROPLASTY Right 01/24/2018   Procedure: RIGHT TOTAL HIP ARTHROPLASTY ANTERIOR APPROACH;  Surgeon: Paralee Cancel, MD;  Location: [facility];  Service: Orthopedics;  Laterality: Right;  70 mins    FAMILY HISTORY: Family History  Problem Relation Age of  Onset   Heart attack Father 28   Heart disease Father    Diabetes Mother    Alzheimer's disease Mother    Alzheimer's disease Brother     SOCIAL HISTORY:  reports that she has never smoked. She has never used smokeless tobacco. She reports that she does not drink alcohol and does not use drugs.  PERFORMANCE STATUS: The patient's performance status is 1 - Symptomatic but completely ambulatory  PHYSICAL EXAM: Most Recent Vital Signs: There were no vitals taken for this visit. BP (!) 112/46 (BP Location: Left Arm, Patient Position: Sitting)   Pulse (!) 59   Temp 98.3 F (36.8 C) (Oral)   Resp 18   Ht '5\' 3"'$  (1.6 m)   Wt 237 lb 6.4 oz (107.7 kg)   SpO2 99%   BMI 42.05 kg/m  General Appearance:    Alert, cooperative, no distress, appears stated age  Head:    Normocephalic, without obvious abnormality, atraumatic  Eyes:    PERRL, conjunctiva/corneas clear, EOM's intact, fundi    benign, both eyes        Throat:   Lips, mucosa, and tongue normal; teeth and gums normal  Neck:   Supple, symmetrical, trachea midline, no adenopathy;    thyroid:  no enlargement/tenderness/nodules; no carotid   bruit or JVD  Back:     Symmetric, no curvature, ROM normal, no CVA tenderness  Lungs:     Clear to auscultation bilaterally, respirations unlabored  Chest Wall:    No tenderness or deformity   Heart:    Regular rate and rhythm, S1 and S2 normal, no murmur, rub   or gallop     Abdomen:     Soft, non-tender, bowel sounds active all four quadrants,    no masses, no organomegaly        Extremities:   Extremities normal, atraumatic, no cyanosis or edema  Pulses:   2+ and symmetric all extremities  Skin:   Skin color, texture, turgor normal, no rashes or lesions  Lymph nodes:   Cervical, supraclavicular, and axillary nodes normal  Neurologic:   CNII-XII intact, normal strength, sensation and reflexes    throughout    LABORATORY DATA:  Results for orders placed or performed in visit on  04/29/21 (from the past 48 hour(s))  CBC with Differential (Cancer Center Only)     Status: Abnormal   Collection Time: 04/29/21  3:13 PM  Result Value Ref Range   WBC Count 5.6 4.0 - 10.5 K/uL   RBC 4.30 3.87 - 5.11 MIL/uL   Hemoglobin 13.2 12.0 - 15.0 g/dL   HCT 40.3 36.0 - 46.0 %   MCV 93.7 80.0 - 100.0 fL   MCH 30.7 26.0 - 34.0 pg   MCHC 32.8 30.0 - 36.0 g/dL   RDW 14.8 11.5 - 15.5 %   Platelet Count 131 (L) 150 - 400 K/uL   nRBC 0.0 0.0 - 0.2 %   Neutrophils Relative % 49 %   Neutro Abs 2.8 1.7 - 7.7 K/uL   Lymphocytes Relative 33 %   Lymphs Abs 1.8 0.7 - 4.0 K/uL   Monocytes Relative 12 %   Monocytes Absolute 0.7 0.1 - 1.0 K/uL   Eosinophils Relative 5 %   Eosinophils Absolute 0.3 0.0 - 0.5 K/uL   Basophils Relative 1 %   Basophils Absolute 0.0 0.0 - 0.1 K/uL   Immature Granulocytes 0 %   Abs Immature Granulocytes 0.02 0.00 - 0.07 K/uL    Comment: Performed at [facility] at [facility], [address] [place], [place] [address]  Save Smear [facility])     Status: None   Collection Time: 04/29/21  3:13 PM  Result Value Ref Range   Smear Review SMEAR STAINED AND AVAILABLE FOR REVIEW     Comment: Performed at [facility] Lab at [facility], [address] [place], [place] [address]  Reticulocytes     Status: None   Collection Time: 04/29/21  3:14 PM  Result Value Ref Range   Retic Ct Pct 1.5 0.4 - 3.1 %   RBC. 4.32 3.87 - 5.11 MIL/uL   Retic Count, Absolute 63.1 19.0 - 186.0 K/uL   Immature Retic Fract 5.4 2.3 - 15.9 %    Comment: Performed at [facility]  Lab at [facility], [address], [place], [place] [address]      RADIOGRAPHY: No results found.     PATHOLOGY: None  ASSESSMENT/PLAN: Ms. Pfingsten is a very pleasant 75 yo caucasian female with recent diagnosis of hemochromatosis, heterozygous for the C282Y mutation.  Ferritin is 519 and iron saturation 54%.  We will get her set up for  phlebotomy every other week x 3 and then follow-up in 8 weeks.   All questions were answered. The patient knows to call the clinic with any problems, questions or concerns. We can certainly see the patient much sooner if necessary.  The patient was discussed with Dr. Marin Olp and he is in agreement with the aforementioned.   Laverna Peace, NP

## 2021-04-29 NOTE — Telephone Encounter (Signed)
Richardson Landry from lab brought me a panic lab value of AST/SGOT of 204. Results given to MD.

## 2021-04-30 ENCOUNTER — Telehealth: Payer: Self-pay | Admitting: *Deleted

## 2021-04-30 LAB — IRON AND TIBC
Iron: 145 ug/dL — ABNORMAL HIGH (ref 41–142)
Saturation Ratios: 54 % (ref 21–57)
TIBC: 269 ug/dL (ref 236–444)
UIBC: 124 ug/dL (ref 120–384)

## 2021-04-30 LAB — FERRITIN: Ferritin: 519 ng/mL — ABNORMAL HIGH (ref 11–307)

## 2021-04-30 NOTE — Telephone Encounter (Signed)
No 04/29/21 LOS

## 2021-05-15 ENCOUNTER — Inpatient Hospital Stay: Payer: Medicare Other | Attending: Hematology & Oncology

## 2021-05-15 ENCOUNTER — Other Ambulatory Visit: Payer: Self-pay | Admitting: *Deleted

## 2021-05-15 ENCOUNTER — Other Ambulatory Visit: Payer: Self-pay

## 2021-05-15 NOTE — Progress Notes (Signed)
Melissa Clements presents today for phlebotomy per MD orders. Phlebotomy procedure started at 1053 and ended at 1059 525 grams removed via 18 gauge needle to right AC.  Patient observed for 30 minutes after procedure without any incident. Patient tolerated procedure well and received replacement fluids after procedure.   Patient understands to call if he has any questions or concerns post discharge.

## 2021-05-15 NOTE — Patient Instructions (Signed)

## 2021-05-18 DIAGNOSIS — I10 Essential (primary) hypertension: Secondary | ICD-10-CM | POA: Diagnosis not present

## 2021-05-18 DIAGNOSIS — I255 Ischemic cardiomyopathy: Secondary | ICD-10-CM | POA: Diagnosis not present

## 2021-05-18 DIAGNOSIS — I480 Paroxysmal atrial fibrillation: Secondary | ICD-10-CM | POA: Diagnosis not present

## 2021-05-18 DIAGNOSIS — Z9861 Coronary angioplasty status: Secondary | ICD-10-CM | POA: Diagnosis not present

## 2021-05-18 DIAGNOSIS — I2510 Atherosclerotic heart disease of native coronary artery without angina pectoris: Secondary | ICD-10-CM | POA: Diagnosis not present

## 2021-05-19 LAB — HEPATIC FUNCTION PANEL
ALT: 82 IU/L — ABNORMAL HIGH (ref 0–32)
AST: 109 IU/L — ABNORMAL HIGH (ref 0–40)
Albumin: 3.3 g/dL — ABNORMAL LOW (ref 3.7–4.7)
Alkaline Phosphatase: 135 IU/L — ABNORMAL HIGH (ref 44–121)
Bilirubin Total: 0.6 mg/dL (ref 0.0–1.2)
Bilirubin, Direct: 0.18 mg/dL (ref 0.00–0.40)
Total Protein: 6.2 g/dL (ref 6.0–8.5)

## 2021-05-20 ENCOUNTER — Telehealth: Payer: Self-pay

## 2021-05-20 NOTE — Telephone Encounter (Signed)
-----   Message from Park Liter, MD sent at 05/20/2021  3:53 PM EDT ----- Liver function test much improved but still not normal

## 2021-05-20 NOTE — Telephone Encounter (Signed)
Left message on patients voicemail to please return our call.   

## 2021-05-20 NOTE — Telephone Encounter (Signed)
Spoke with patient regarding results and recommendation.  Patient verbalizes understanding and is agreeable to plan of care. Advised patient to call back with any issues or concerns.  

## 2021-05-20 NOTE — Telephone Encounter (Signed)
Melissa Clements is returning [place] call. Please advise.

## 2021-05-25 ENCOUNTER — Other Ambulatory Visit: Payer: Self-pay | Admitting: Cardiology

## 2021-05-25 MED ORDER — METOPROLOL SUCCINATE ER 50 MG PO TB24: 25.0000 mg | ORAL_TABLET | Freq: Every day | ORAL | 3 refills | Status: DC

## 2021-05-29 ENCOUNTER — Other Ambulatory Visit: Payer: Self-pay

## 2021-05-29 ENCOUNTER — Inpatient Hospital Stay: Payer: Medicare Other

## 2021-05-29 NOTE — Patient Instructions (Signed)
Therapeutic Phlebotomy Therapeutic phlebotomy is the planned removal of blood from a person's body for the purpose of treating a medical condition. The procedure is similar to donating blood. Usually, about a pint (470 mL, or 0.47 L) of blood is removed.The average adult has 9-12 pints (4.3-5.7 L) of blood in the body. Therapeutic phlebotomy may be used to treat the following medical conditions: Hemochromatosis. This is a condition in which the blood contains too much iron. Polycythemia vera. This is a condition in which the blood contains too many red blood cells. Porphyria cutanea tarda. This is a disease in which an important part of hemoglobin is not made properly. It results in the buildup of abnormal amounts of porphyrins in the body. Sickle cell disease. This is a condition in which the red blood cells form an abnormal crescent shape rather than a round shape. Tell a health care provider about: Any allergies you have. All medicines you are taking, including vitamins, herbs, eye drops, creams, and over-the-counter medicines. Any problems you or family members have had with anesthetic medicines. Any blood disorders you have. Any surgeries you have had. Any medical conditions you have. Whether you are pregnant or may be pregnant. What are the risks? Generally, this is a safe procedure. However, problems may occur, including: Nausea or light-headedness. Low blood pressure (hypotension). Soreness, bleeding, swelling, or bruising at the needle insertion site. Infection. What happens before the procedure? Follow instructions from your health care provider about eating or drinking restrictions. Ask your health care provider about: Changing or stopping your regular medicines. This is especially important if you are taking diabetes medicines or blood thinners (anticoagulants). Taking medicines such as aspirin and ibuprofen. These medicines can thin your blood. Do not take these medicines unless  your health care provider tells you to take them. Taking over-the-counter medicines, vitamins, herbs, and supplements. Wear clothing with sleeves that can be raised above the elbow. Plan to have someone take you home from the hospital or clinic. You may have a blood sample taken. Your blood pressure, pulse rate, and breathing rate will be measured. What happens during the procedure?  To lower your risk of infection: Your health care team will wash or sanitize their hands. Your skin will be cleaned with an antiseptic. You may be given a medicine to numb the area (local anesthetic). A tourniquet will be placed on your arm. A needle will be inserted into one of your veins. Tubing and a collection bag will be attached to that needle. Blood will flow through the needle and tubing into the collection bag. The collection bag will be placed lower than your arm to allow gravity to help the flow of blood into the bag. You may be asked to open and close your hand slowly and continually during the entire collection. After the specified amount of blood has been removed from your body, the collection bag and tubing will be clamped. The needle will be removed from your vein. Pressure will be held on the site of the needle insertion to stop the bleeding. A bandage (dressing) will be placed over the needle insertion site. The procedure may vary among health care providers and hospitals. What happens after the procedure? Your blood pressure, pulse rate, and breathing rate will be measured after the procedure. You will be encouraged to drink fluids. Your recovery will be assessed and monitored. You can return to your normal activities as told by your health care provider. Summary Therapeutic phlebotomy is the planned removal of   blood from a person's body for the purpose of treating a medical condition. Therapeutic phlebotomy may be used to treat hemochromatosis, polycythemia vera, porphyria cutanea tarda,  or sickle cell disease. In the procedure, a needle is inserted and about a pint (470 mL, or 0.47 L) of blood is removed. The average adult has 9-12 pints (4.3-5.7 L) of blood in the body. This is generally a safe procedure, but it can sometimes cause problems such as nausea, light-headedness, or low blood pressure (hypotension). This information is not intended to replace advice given to you by your health care provider. Make sure you discuss any questions you have with your healthcare provider. Document Revised: 10/06/2017 Document Reviewed: 10/06/2017 Elsevier Patient Education  2022 Elsevier Inc.  

## 2021-05-29 NOTE — Progress Notes (Signed)
One unit phlebotomy taken from right antecubital area without incident.  Tolerated well.  Nourishments given.

## 2021-06-12 ENCOUNTER — Other Ambulatory Visit: Payer: Self-pay

## 2021-06-12 ENCOUNTER — Inpatient Hospital Stay: Payer: Medicare Other | Attending: Hematology & Oncology

## 2021-06-12 DIAGNOSIS — M7989 Other specified soft tissue disorders: Secondary | ICD-10-CM | POA: Insufficient documentation

## 2021-06-12 DIAGNOSIS — R5383 Other fatigue: Secondary | ICD-10-CM | POA: Diagnosis not present

## 2021-06-12 DIAGNOSIS — R002 Palpitations: Secondary | ICD-10-CM | POA: Insufficient documentation

## 2021-06-12 DIAGNOSIS — Z79899 Other long term (current) drug therapy: Secondary | ICD-10-CM | POA: Insufficient documentation

## 2021-06-12 DIAGNOSIS — R0602 Shortness of breath: Secondary | ICD-10-CM | POA: Diagnosis not present

## 2021-06-12 NOTE — Progress Notes (Signed)
Phlebotomy Melissa Clements presents today for phlebotomy per MD orders. Phlebotomy procedure started at 1122 and ended at 1136. 500 grams removed. Patient observed for 30 minutes after procedure without any incident. Patient tolerated procedure well. IV needle removed intact.

## 2021-06-15 DIAGNOSIS — L570 Actinic keratosis: Secondary | ICD-10-CM | POA: Diagnosis not present

## 2021-06-15 DIAGNOSIS — I872 Venous insufficiency (chronic) (peripheral): Secondary | ICD-10-CM | POA: Diagnosis not present

## 2021-06-15 DIAGNOSIS — D225 Melanocytic nevi of trunk: Secondary | ICD-10-CM | POA: Diagnosis not present

## 2021-06-15 DIAGNOSIS — L578 Other skin changes due to chronic exposure to nonionizing radiation: Secondary | ICD-10-CM | POA: Diagnosis not present

## 2021-06-15 DIAGNOSIS — L814 Other melanin hyperpigmentation: Secondary | ICD-10-CM | POA: Diagnosis not present

## 2021-06-15 DIAGNOSIS — D2271 Melanocytic nevi of right lower limb, including hip: Secondary | ICD-10-CM | POA: Diagnosis not present

## 2021-06-15 DIAGNOSIS — Z86007 Personal history of in-situ neoplasm of skin: Secondary | ICD-10-CM | POA: Diagnosis not present

## 2021-06-15 DIAGNOSIS — L821 Other seborrheic keratosis: Secondary | ICD-10-CM | POA: Diagnosis not present

## 2021-06-29 ENCOUNTER — Inpatient Hospital Stay: Payer: Medicare Other

## 2021-06-29 ENCOUNTER — Other Ambulatory Visit: Payer: Self-pay

## 2021-06-29 ENCOUNTER — Encounter: Payer: Self-pay | Admitting: Family

## 2021-06-29 ENCOUNTER — Inpatient Hospital Stay: Payer: Medicare Other | Admitting: Family

## 2021-06-29 ENCOUNTER — Telehealth: Payer: Self-pay

## 2021-06-29 DIAGNOSIS — R0602 Shortness of breath: Secondary | ICD-10-CM | POA: Diagnosis not present

## 2021-06-29 DIAGNOSIS — R5383 Other fatigue: Secondary | ICD-10-CM | POA: Diagnosis not present

## 2021-06-29 DIAGNOSIS — M7989 Other specified soft tissue disorders: Secondary | ICD-10-CM | POA: Diagnosis not present

## 2021-06-29 DIAGNOSIS — Z79899 Other long term (current) drug therapy: Secondary | ICD-10-CM | POA: Diagnosis not present

## 2021-06-29 DIAGNOSIS — R002 Palpitations: Secondary | ICD-10-CM | POA: Diagnosis not present

## 2021-06-29 LAB — CBC WITH DIFFERENTIAL (CANCER CENTER ONLY)
Abs Immature Granulocytes: 0 10*3/uL (ref 0.00–0.07)
Basophils Absolute: 0 10*3/uL (ref 0.0–0.1)
Basophils Relative: 1 %
Eosinophils Absolute: 0.4 10*3/uL (ref 0.0–0.5)
Eosinophils Relative: 9 %
HCT: 35.7 % — ABNORMAL LOW (ref 36.0–46.0)
Hemoglobin: 11.6 g/dL — ABNORMAL LOW (ref 12.0–15.0)
Immature Granulocytes: 0 %
Lymphocytes Relative: 38 %
Lymphs Abs: 1.6 10*3/uL (ref 0.7–4.0)
MCH: 31.4 pg (ref 26.0–34.0)
MCHC: 32.5 g/dL (ref 30.0–36.0)
MCV: 96.7 fL (ref 80.0–100.0)
Monocytes Absolute: 0.6 10*3/uL (ref 0.1–1.0)
Monocytes Relative: 15 %
Neutro Abs: 1.6 10*3/uL — ABNORMAL LOW (ref 1.7–7.7)
Neutrophils Relative %: 37 %
Platelet Count: 131 10*3/uL — ABNORMAL LOW (ref 150–400)
RBC: 3.69 MIL/uL — ABNORMAL LOW (ref 3.87–5.11)
RDW: 14.1 % (ref 11.5–15.5)
WBC Count: 4.2 10*3/uL (ref 4.0–10.5)
nRBC: 0 % (ref 0.0–0.2)

## 2021-06-29 LAB — CMP (CANCER CENTER ONLY)
ALT: 33 U/L (ref 0–44)
AST: 60 U/L — ABNORMAL HIGH (ref 15–41)
Albumin: 3.2 g/dL — ABNORMAL LOW (ref 3.5–5.0)
Alkaline Phosphatase: 89 U/L (ref 38–126)
Anion gap: 6 (ref 5–15)
BUN: 18 mg/dL (ref 8–23)
CO2: 25 mmol/L (ref 22–32)
Calcium: 8.8 mg/dL — ABNORMAL LOW (ref 8.9–10.3)
Chloride: 109 mmol/L (ref 98–111)
Creatinine: 1.12 mg/dL — ABNORMAL HIGH (ref 0.44–1.00)
GFR, Estimated: 51 mL/min — ABNORMAL LOW (ref >60)
Glucose, Bld: 100 mg/dL — ABNORMAL HIGH (ref 70–99)
Potassium: 5.1 mmol/L (ref 3.5–5.1)
Sodium: 140 mmol/L (ref 135–145)
Total Bilirubin: 0.6 mg/dL (ref 0.3–1.2)
Total Protein: 6.1 g/dL — ABNORMAL LOW (ref 6.5–8.1)

## 2021-06-29 LAB — IRON AND TIBC
Iron: 52 ug/dL (ref 41–142)
Saturation Ratios: 16 % — ABNORMAL LOW (ref 21–57)
TIBC: 316 ug/dL (ref 236–444)
UIBC: 265 ug/dL (ref 120–384)

## 2021-06-29 LAB — FERRITIN: Ferritin: 38 ng/mL (ref 11–307)

## 2021-06-29 NOTE — Progress Notes (Signed)
Hematology and Oncology Follow Up Visit  Melissa Clements [id number] 1945-12-21 75 y.o. 06/29/2021   Principle Diagnosis:  Hemochromatosis, heterozygous for the C282Y mutation  Current Therapy:   Phlebotomy to maintain ferritin < 100 and iron saturation < 50%   Interim History:  Melissa Clements is here today for follow-up. She is doing well but notes fatigue and mild SOB with exertion.  Her LFT's continue to improve since reducing her Lipitor dose.  She has occasional palpitations with PVC's.  No fever, chills, n/v, cough, rash, dizziness, chest pain, abdominal pain or changes in bowel or bladder habits.  She states that she has chronic swelling in her legs during the summer and that she also tries to avoid salt.  No tenderness, numbness or tingling in her extremities.  No falls or syncope.  She has maintained a good appetite and is staying well hydrated. Her weight is stable at 244 lbs.   ECOG Performance Status: 1 - Symptomatic but completely ambulatory  Medications:  Allergies as of 06/29/2021   No Known Allergies      Medication List        Accurate as of June 29, 2021 10:29 AM. If you have any questions, ask your nurse or doctor.          atorvastatin 80 MG tablet Commonly known as: LIPITOR Take 0.5 tablets (40 mg total) by mouth daily at 6 PM.   docusate sodium 100 MG capsule Commonly known as: COLACE Take 100 mg by mouth daily.   lisinopril 2.5 MG tablet Commonly known as: ZESTRIL Take 1 tablet (2.5 mg total) by mouth daily.   metoprolol succinate 50 MG 24 hr tablet Commonly known as: TOPROL-XL Take 0.5 tablets (25 mg total) by mouth daily. Hold if heart rate is <60 bpm   MOVE FREE ULTRA JOINT HEALTH PO Take 2 tablets by mouth daily. Unknown strenght   nitroGLYCERIN 0.4 MG SL tablet Commonly known as: NITROSTAT Place 1 tablet (0.4 mg total) under the tongue every 5 (five) minutes x 3 doses as needed for chest pain.   rivaroxaban 20 MG Tabs  tablet Commonly known as: XARELTO Take 1 tablet (20 mg total) by mouth daily with supper.   Vitamin D 50 MCG (2000 UT) tablet Take 2,000 Units by mouth every other day.        Allergies: No Known Allergies  Past Medical History, Surgical history, Social history, and Family History were reviewed and updated.  Review of Systems: All other 10 point review of systems is negative.   Physical Exam:  vitals were not taken for this visit.   Wt Readings from Last 3 Encounters:  04/29/21 237 lb 6.4 oz (107.7 kg)  04/20/21 234 lb (106.1 kg)  12/11/20 239 lb (108.4 kg)    Ocular: Sclerae unicteric, pupils equal, round and reactive to light Ear-nose-throat: Oropharynx clear, dentition fair Lymphatic: No cervical or supraclavicular adenopathy Lungs no rales or rhonchi, good excursion bilaterally Heart regular rate and rhythm, no murmur appreciated Abd soft, nontender, positive bowel sounds MSK no focal spinal tenderness, no joint edema Neuro: non-focal, well-oriented, appropriate affect Breasts: Deferred   Lab Results  Component Value Date   WBC 4.2 06/29/2021   HGB 11.6 (L) 06/29/2021   HCT 35.7 (L) 06/29/2021   MCV 96.7 06/29/2021   PLT 131 (L) 06/29/2021   Lab Results  Component Value Date   FERRITIN 519 (H) 04/29/2021   IRON 145 (H) 04/29/2021   TIBC 269 04/29/2021   UIBC 124  04/29/2021   IRONPCTSAT 54 04/29/2021   Lab Results  Component Value Date   RETICCTPCT 1.5 04/29/2021   RBC 3.69 (L) 06/29/2021   No results found for: KPAFRELGTCHN, LAMBDASER, KAPLAMBRATIO No results found for: IGGSERUM, IGA, IGMSERUM No results found for: Odetta Pink, SPEI   Chemistry      Component Value Date/Time   NA 136 04/29/2021 1513   NA 140 08/25/2020 1056   K 4.3 04/29/2021 1513   CL 105 04/29/2021 1513   CO2 23 04/29/2021 1513   BUN 31 (H) 04/29/2021 1513   BUN 15 08/25/2020 1056   CREATININE 1.17 (H) 04/29/2021 1513       Component Value Date/Time   CALCIUM 9.6 04/29/2021 1513   ALKPHOS 135 (H) 05/18/2021 1315   AST 109 (H) 05/18/2021 1315   AST 204 (HH) 04/29/2021 1513   ALT 82 (H) 05/18/2021 1315   ALT 152 (H) 04/29/2021 1513   BILITOT 0.6 05/18/2021 1315   BILITOT 0.8 04/29/2021 1513       Impression and Plan: Melissa Clements is a very pleasant 75 yo caucasian female with recent diagnosis of hemochromatosis, heterozygous for the C282Y mutation.  Iron studies are pending. We will schedule phlebotomy if needed.  Lab check monthly with follow-up in 3 months.  She can contact our office with any questions or concerns.  Lottie Dawson, NP 9/26/202210:29 AM

## 2021-06-29 NOTE — Telephone Encounter (Signed)
-----   Message from Celso Amy, NP sent at 06/29/2021  1:44 PM EDT ----- Iron is low which is likely contributing to her fatigue. No phlebotomy needed! Will check again in 1 month. Thank you!   ----- Message ----- From: Interface, Lab In [place] Sent: 06/29/2021  10:21 AM EDT To: Celso Amy, NP

## 2021-06-29 NOTE — Telephone Encounter (Signed)
Called and informed patient of lab results, patient verbalized understanding and denies any questions or concerns at this time.   

## 2021-06-30 ENCOUNTER — Telehealth: Payer: Self-pay

## 2021-06-30 NOTE — Telephone Encounter (Signed)
-----   Message from Park Liter, MD sent at 06/30/2021  9:56 AM EDT ----- Overall lab seems to be getting better.  Her liver function test almost normalized, protein a low.  That is for conversation with primary care physician

## 2021-06-30 NOTE — Telephone Encounter (Signed)
Pt states that her heart rate has increased the past few days into the 80's. Pt wants to be seen to make sure that she is not back in afib. Appointment made.

## 2021-07-07 ENCOUNTER — Other Ambulatory Visit: Payer: Self-pay

## 2021-07-08 ENCOUNTER — Ambulatory Visit: Payer: Medicare Other | Admitting: Cardiology

## 2021-07-14 DIAGNOSIS — N183 Chronic kidney disease, stage 3 unspecified: Secondary | ICD-10-CM | POA: Diagnosis not present

## 2021-07-14 DIAGNOSIS — E782 Mixed hyperlipidemia: Secondary | ICD-10-CM | POA: Diagnosis not present

## 2021-07-14 DIAGNOSIS — M1611 Unilateral primary osteoarthritis, right hip: Secondary | ICD-10-CM | POA: Diagnosis not present

## 2021-07-14 DIAGNOSIS — I480 Paroxysmal atrial fibrillation: Secondary | ICD-10-CM | POA: Diagnosis not present

## 2021-07-14 DIAGNOSIS — I255 Ischemic cardiomyopathy: Secondary | ICD-10-CM | POA: Diagnosis not present

## 2021-07-14 DIAGNOSIS — I2510 Atherosclerotic heart disease of native coronary artery without angina pectoris: Secondary | ICD-10-CM | POA: Diagnosis not present

## 2021-07-14 DIAGNOSIS — I10 Essential (primary) hypertension: Secondary | ICD-10-CM | POA: Diagnosis not present

## 2021-07-16 DIAGNOSIS — Z961 Presence of intraocular lens: Secondary | ICD-10-CM | POA: Diagnosis not present

## 2021-07-16 DIAGNOSIS — H04123 Dry eye syndrome of bilateral lacrimal glands: Secondary | ICD-10-CM | POA: Diagnosis not present

## 2021-07-16 DIAGNOSIS — H43812 Vitreous degeneration, left eye: Secondary | ICD-10-CM | POA: Diagnosis not present

## 2021-07-16 DIAGNOSIS — H40013 Open angle with borderline findings, low risk, bilateral: Secondary | ICD-10-CM | POA: Diagnosis not present

## 2021-07-28 ENCOUNTER — Other Ambulatory Visit (HOSPITAL_BASED_OUTPATIENT_CLINIC_OR_DEPARTMENT_OTHER): Payer: Self-pay | Admitting: Family Medicine

## 2021-07-28 ENCOUNTER — Other Ambulatory Visit: Payer: Self-pay

## 2021-07-28 ENCOUNTER — Inpatient Hospital Stay: Payer: Medicare Other | Attending: Hematology & Oncology

## 2021-07-28 DIAGNOSIS — H903 Sensorineural hearing loss, bilateral: Secondary | ICD-10-CM | POA: Diagnosis not present

## 2021-07-28 DIAGNOSIS — Z1231 Encounter for screening mammogram for malignant neoplasm of breast: Secondary | ICD-10-CM

## 2021-07-28 LAB — CMP (CANCER CENTER ONLY)
ALT: 38 U/L (ref 0–44)
AST: 86 U/L — ABNORMAL HIGH (ref 15–41)
Albumin: 3.1 g/dL — ABNORMAL LOW (ref 3.5–5.0)
Alkaline Phosphatase: 106 U/L (ref 38–126)
Anion gap: 7 (ref 5–15)
BUN: 13 mg/dL (ref 8–23)
CO2: 26 mmol/L (ref 22–32)
Calcium: 9.3 mg/dL (ref 8.9–10.3)
Chloride: 109 mmol/L (ref 98–111)
Creatinine: 1.09 mg/dL — ABNORMAL HIGH (ref 0.44–1.00)
GFR, Estimated: 53 mL/min — ABNORMAL LOW (ref >60)
Glucose, Bld: 104 mg/dL — ABNORMAL HIGH (ref 70–99)
Potassium: 4.6 mmol/L (ref 3.5–5.1)
Sodium: 142 mmol/L (ref 135–145)
Total Bilirubin: 0.6 mg/dL (ref 0.3–1.2)
Total Protein: 6.2 g/dL — ABNORMAL LOW (ref 6.5–8.1)

## 2021-07-28 LAB — CBC WITH DIFFERENTIAL (CANCER CENTER ONLY)
Abs Immature Granulocytes: 0.01 10*3/uL (ref 0.00–0.07)
Basophils Absolute: 0 10*3/uL (ref 0.0–0.1)
Basophils Relative: 1 %
Eosinophils Absolute: 0.5 10*3/uL (ref 0.0–0.5)
Eosinophils Relative: 10 %
HCT: 38.3 % (ref 36.0–46.0)
Hemoglobin: 12.2 g/dL (ref 12.0–15.0)
Immature Granulocytes: 0 %
Lymphocytes Relative: 32 %
Lymphs Abs: 1.6 10*3/uL (ref 0.7–4.0)
MCH: 30.3 pg (ref 26.0–34.0)
MCHC: 31.9 g/dL (ref 30.0–36.0)
MCV: 95 fL (ref 80.0–100.0)
Monocytes Absolute: 0.7 10*3/uL (ref 0.1–1.0)
Monocytes Relative: 14 %
Neutro Abs: 2.1 10*3/uL (ref 1.7–7.7)
Neutrophils Relative %: 43 %
Platelet Count: 121 10*3/uL — ABNORMAL LOW (ref 150–400)
RBC: 4.03 MIL/uL (ref 3.87–5.11)
RDW: 13.2 % (ref 11.5–15.5)
WBC Count: 4.9 10*3/uL (ref 4.0–10.5)
nRBC: 0 % (ref 0.0–0.2)

## 2021-07-29 LAB — IRON AND TIBC
Iron: 49 ug/dL (ref 41–142)
Saturation Ratios: 15 % — ABNORMAL LOW (ref 21–57)
TIBC: 325 ug/dL (ref 236–444)
UIBC: 276 ug/dL (ref 120–384)

## 2021-07-29 LAB — FERRITIN: Ferritin: 47 ng/mL (ref 11–307)

## 2021-08-31 ENCOUNTER — Inpatient Hospital Stay: Payer: Medicare Other | Attending: Hematology & Oncology

## 2021-08-31 ENCOUNTER — Other Ambulatory Visit: Payer: Self-pay

## 2021-08-31 LAB — CBC WITH DIFFERENTIAL (CANCER CENTER ONLY)
Abs Immature Granulocytes: 0.01 10*3/uL (ref 0.00–0.07)
Basophils Absolute: 0 10*3/uL (ref 0.0–0.1)
Basophils Relative: 1 %
Eosinophils Absolute: 0.3 10*3/uL (ref 0.0–0.5)
Eosinophils Relative: 7 %
HCT: 40.1 % (ref 36.0–46.0)
Hemoglobin: 12.9 g/dL (ref 12.0–15.0)
Immature Granulocytes: 0 %
Lymphocytes Relative: 35 %
Lymphs Abs: 1.5 10*3/uL (ref 0.7–4.0)
MCH: 29.5 pg (ref 26.0–34.0)
MCHC: 32.2 g/dL (ref 30.0–36.0)
MCV: 91.8 fL (ref 80.0–100.0)
Monocytes Absolute: 0.5 10*3/uL (ref 0.1–1.0)
Monocytes Relative: 13 %
Neutro Abs: 1.9 10*3/uL (ref 1.7–7.7)
Neutrophils Relative %: 44 %
Platelet Count: 127 10*3/uL — ABNORMAL LOW (ref 150–400)
RBC: 4.37 MIL/uL (ref 3.87–5.11)
RDW: 13.2 % (ref 11.5–15.5)
WBC Count: 4.3 10*3/uL (ref 4.0–10.5)
nRBC: 0 % (ref 0.0–0.2)

## 2021-08-31 LAB — CMP (CANCER CENTER ONLY)
ALT: 107 U/L — ABNORMAL HIGH (ref 0–44)
AST: 221 U/L (ref 15–41)
Albumin: 3.4 g/dL — ABNORMAL LOW (ref 3.5–5.0)
Alkaline Phosphatase: 133 U/L — ABNORMAL HIGH (ref 38–126)
Anion gap: 7 (ref 5–15)
BUN: 18 mg/dL (ref 8–23)
CO2: 24 mmol/L (ref 22–32)
Calcium: 9.4 mg/dL (ref 8.9–10.3)
Chloride: 107 mmol/L (ref 98–111)
Creatinine: 1.03 mg/dL — ABNORMAL HIGH (ref 0.44–1.00)
GFR, Estimated: 57 mL/min — ABNORMAL LOW (ref >60)
Glucose, Bld: 103 mg/dL — ABNORMAL HIGH (ref 70–99)
Potassium: 4.2 mmol/L (ref 3.5–5.1)
Sodium: 138 mmol/L (ref 135–145)
Total Bilirubin: 0.8 mg/dL (ref 0.3–1.2)
Total Protein: 6.6 g/dL (ref 6.5–8.1)

## 2021-09-01 LAB — IRON AND TIBC
Iron: 60 ug/dL (ref 28–170)
Saturation Ratios: 15 % (ref 10.4–31.8)
TIBC: 408 ug/dL (ref 250–450)
UIBC: 348 ug/dL

## 2021-09-01 LAB — FERRITIN: Ferritin: 24 ng/mL (ref 11–307)

## 2021-09-07 ENCOUNTER — Other Ambulatory Visit: Payer: Self-pay

## 2021-09-07 ENCOUNTER — Ambulatory Visit (HOSPITAL_BASED_OUTPATIENT_CLINIC_OR_DEPARTMENT_OTHER)
Admission: RE | Admit: 2021-09-07 | Discharge: 2021-09-07 | Disposition: A | Discharge status: Home / Self Care | Payer: Medicare Other | Source: Ambulatory Visit | Attending: Family Medicine | Admitting: Family Medicine

## 2021-09-07 ENCOUNTER — Encounter (HOSPITAL_BASED_OUTPATIENT_CLINIC_OR_DEPARTMENT_OTHER): Payer: Self-pay

## 2021-09-07 DIAGNOSIS — Z1231 Encounter for screening mammogram for malignant neoplasm of breast: Secondary | ICD-10-CM | POA: Insufficient documentation

## 2021-09-25 ENCOUNTER — Other Ambulatory Visit: Payer: Self-pay | Admitting: Family

## 2021-09-29 ENCOUNTER — Encounter: Payer: Self-pay | Admitting: Family

## 2021-09-29 ENCOUNTER — Inpatient Hospital Stay: Payer: Medicare Other

## 2021-09-29 ENCOUNTER — Inpatient Hospital Stay: Payer: Medicare Other | Attending: Hematology & Oncology | Admitting: Family

## 2021-09-29 ENCOUNTER — Other Ambulatory Visit: Payer: Self-pay

## 2021-09-29 DIAGNOSIS — R5383 Other fatigue: Secondary | ICD-10-CM | POA: Diagnosis not present

## 2021-09-29 DIAGNOSIS — Z79899 Other long term (current) drug therapy: Secondary | ICD-10-CM | POA: Insufficient documentation

## 2021-09-29 DIAGNOSIS — M7989 Other specified soft tissue disorders: Secondary | ICD-10-CM | POA: Insufficient documentation

## 2021-09-29 LAB — CMP (CANCER CENTER ONLY)
ALT: 80 U/L — ABNORMAL HIGH (ref 0–44)
AST: 166 U/L — ABNORMAL HIGH (ref 15–41)
Albumin: 3.4 g/dL — ABNORMAL LOW (ref 3.5–5.0)
Alkaline Phosphatase: 123 U/L (ref 38–126)
Anion gap: 9 (ref 5–15)
BUN: 21 mg/dL (ref 8–23)
CO2: 22 mmol/L (ref 22–32)
Calcium: 9.4 mg/dL (ref 8.9–10.3)
Chloride: 106 mmol/L (ref 98–111)
Creatinine: 1.12 mg/dL — ABNORMAL HIGH (ref 0.44–1.00)
GFR, Estimated: 51 mL/min — ABNORMAL LOW (ref >60)
Glucose, Bld: 107 mg/dL — ABNORMAL HIGH (ref 70–99)
Potassium: 4 mmol/L (ref 3.5–5.1)
Sodium: 137 mmol/L (ref 135–145)
Total Bilirubin: 0.7 mg/dL (ref 0.3–1.2)
Total Protein: 6.8 g/dL (ref 6.5–8.1)

## 2021-09-29 LAB — IRON AND IRON BINDING CAPACITY (CC-WL,HP ONLY)
Iron: 65 ug/dL (ref 28–170)
Saturation Ratios: 16 % (ref 10.4–31.8)
TIBC: 414 ug/dL (ref 250–450)
UIBC: 349 ug/dL (ref 148–442)

## 2021-09-29 LAB — CBC WITH DIFFERENTIAL (CANCER CENTER ONLY)
Abs Immature Granulocytes: 0.02 10*3/uL (ref 0.00–0.07)
Basophils Absolute: 0.1 10*3/uL (ref 0.0–0.1)
Basophils Relative: 1 %
Eosinophils Absolute: 0.3 10*3/uL (ref 0.0–0.5)
Eosinophils Relative: 7 %
HCT: 40.5 % (ref 36.0–46.0)
Hemoglobin: 13.2 g/dL (ref 12.0–15.0)
Immature Granulocytes: 0 %
Lymphocytes Relative: 44 %
Lymphs Abs: 2.1 10*3/uL (ref 0.7–4.0)
MCH: 28.9 pg (ref 26.0–34.0)
MCHC: 32.6 g/dL (ref 30.0–36.0)
MCV: 88.8 fL (ref 80.0–100.0)
Monocytes Absolute: 0.6 10*3/uL (ref 0.1–1.0)
Monocytes Relative: 12 %
Neutro Abs: 1.7 10*3/uL (ref 1.7–7.7)
Neutrophils Relative %: 36 %
Platelet Count: 138 10*3/uL — ABNORMAL LOW (ref 150–400)
RBC: 4.56 MIL/uL (ref 3.87–5.11)
RDW: 14.6 % (ref 11.5–15.5)
WBC Count: 4.8 10*3/uL (ref 4.0–10.5)
nRBC: 0 % (ref 0.0–0.2)

## 2021-09-29 NOTE — Progress Notes (Signed)
Hematology and Oncology Follow Up Visit  Melissa Clements [id number] Jul 29, 1946 75 y.o. 09/29/2021   Principle Diagnosis:  Hemochromatosis, heterozygous for the C282Y mutation   Current Therapy:        Phlebotomy to maintain ferritin < 100 and iron saturation < 50%   Interim History:  Melissa Clements is here today for follow-up. She is doing well but still notes some fatigue at times.   She has not noted any blood loss. No bruising or petechiae.  No fever, chills, n/v, cough, rash, dizziness, SOB, chest pain, palpitations, abdominal pain or changes in bowel or bladder habits.  She has chronic swelling in her lower extremities that waxes and wanes. She avoids salt and elevates her legs when she is able. She also wears her compression stockings regularly.  No falls or syncope.  Appetite is good and she is staying well hydrated. Her weight is stable at 235 lbs.   ECOG Performance Status: 1 - Symptomatic but completely ambulatory  Medications:  Allergies as of 09/29/2021   No Known Allergies      Medication List        Accurate as of September 29, 2021 12:26 PM. If you have any questions, ask your nurse or doctor.          atorvastatin 80 MG tablet Commonly known as: LIPITOR Take 0.5 tablets (40 mg total) by mouth daily at 6 PM.   docusate sodium 100 MG capsule Commonly known as: COLACE Take 100 mg by mouth daily.   lisinopril 2.5 MG tablet Commonly known as: ZESTRIL Take 1 tablet (2.5 mg total) by mouth daily.   metoprolol succinate 50 MG 24 hr tablet Commonly known as: TOPROL-XL Take 0.5 tablets (25 mg total) by mouth daily. Hold if heart rate is <60 bpm   nitroGLYCERIN 0.4 MG SL tablet Commonly known as: NITROSTAT Place 1 tablet (0.4 mg total) under the tongue every 5 (five) minutes x 3 doses as needed for chest pain.   rivaroxaban 20 MG Tabs tablet Commonly known as: XARELTO Take 1 tablet (20 mg total) by mouth daily with supper.   Vitamin D 50 MCG (2000 UT)  tablet Take 2,000 Units by mouth every other day.        Allergies: No Known Allergies  Past Medical History, Surgical history, Social history, and Family History were reviewed and updated.  Review of Systems: All other 10 point review of systems is negative.   Physical Exam:  weight is 235 lb 1.9 oz (106.6 kg). Her oral temperature is 98.8 F (37.1 C). Her blood pressure is 163/74 (abnormal) and her pulse is 60. Her respiration is 18 and oxygen saturation is 98%.   Wt Readings from Last 3 Encounters:  09/29/21 235 lb 1.9 oz (106.6 kg)  06/29/21 244 lb 12.8 oz (111 kg)  04/29/21 237 lb 6.4 oz (107.7 kg)    Ocular: Sclerae unicteric, pupils equal, round and reactive to light Ear-nose-throat: Oropharynx clear, dentition fair Lymphatic: No cervical or supraclavicular adenopathy Lungs no rales or rhonchi, good excursion bilaterally Heart regular rate and rhythm, no murmur appreciated Abd soft, nontender, positive bowel sounds MSK no focal spinal tenderness, no joint edema Neuro: non-focal, well-oriented, appropriate affect Breasts: Deferred   Lab Results  Component Value Date   WBC 4.8 09/29/2021   HGB 13.2 09/29/2021   HCT 40.5 09/29/2021   MCV 88.8 09/29/2021   PLT 138 (L) 09/29/2021   Lab Results  Component Value Date   FERRITIN 24 08/31/2021  IRON 60 08/31/2021   TIBC 408 08/31/2021   UIBC 348 08/31/2021   IRONPCTSAT 15 08/31/2021   Lab Results  Component Value Date   RETICCTPCT 1.5 04/29/2021   RBC 4.56 09/29/2021   No results found for: KPAFRELGTCHN, LAMBDASER, KAPLAMBRATIO No results found for: IGGSERUM, IGA, IGMSERUM No results found for: Odetta Pink, SPEI   Chemistry      Component Value Date/Time   NA 137 09/29/2021 1043   NA 140 08/25/2020 1056   K 4.0 09/29/2021 1043   CL 106 09/29/2021 1043   CO2 22 09/29/2021 1043   BUN 21 09/29/2021 1043   BUN 15 08/25/2020 1056   CREATININE 1.12 (H)  09/29/2021 1043      Component Value Date/Time   CALCIUM 9.4 09/29/2021 1043   ALKPHOS 123 09/29/2021 1043   AST 166 (H) 09/29/2021 1043   ALT 80 (H) 09/29/2021 1043   BILITOT 0.7 09/29/2021 1043       Impression and Plan: Melissa Clements is a very pleasant 75 yo caucasian female with recent diagnosis of hemochromatosis, heterozygous for the C282Y mutation.  Iron studies are pending. We will get her scheduled for phlebotomy if needed.  Lab check every 6 weeks with follow-up in 3 months.   Lottie Dawson, NP 12/27/202212:26 PM

## 2021-09-30 LAB — FERRITIN: Ferritin: 38 ng/mL (ref 11–307)

## 2021-10-16 DIAGNOSIS — I2510 Atherosclerotic heart disease of native coronary artery without angina pectoris: Secondary | ICD-10-CM | POA: Diagnosis not present

## 2021-10-16 DIAGNOSIS — M1611 Unilateral primary osteoarthritis, right hip: Secondary | ICD-10-CM | POA: Diagnosis not present

## 2021-10-16 DIAGNOSIS — I255 Ischemic cardiomyopathy: Secondary | ICD-10-CM | POA: Diagnosis not present

## 2021-10-16 DIAGNOSIS — N183 Chronic kidney disease, stage 3 unspecified: Secondary | ICD-10-CM | POA: Diagnosis not present

## 2021-10-16 DIAGNOSIS — E782 Mixed hyperlipidemia: Secondary | ICD-10-CM | POA: Diagnosis not present

## 2021-10-16 DIAGNOSIS — I10 Essential (primary) hypertension: Secondary | ICD-10-CM | POA: Diagnosis not present

## 2021-10-16 DIAGNOSIS — I480 Paroxysmal atrial fibrillation: Secondary | ICD-10-CM | POA: Diagnosis not present

## 2021-10-19 DIAGNOSIS — E559 Vitamin D deficiency, unspecified: Secondary | ICD-10-CM | POA: Insufficient documentation

## 2021-10-19 DIAGNOSIS — I7090 Unspecified atherosclerosis: Secondary | ICD-10-CM | POA: Insufficient documentation

## 2021-10-19 DIAGNOSIS — N3946 Mixed incontinence: Secondary | ICD-10-CM | POA: Insufficient documentation

## 2021-10-19 DIAGNOSIS — D6859 Other primary thrombophilia: Secondary | ICD-10-CM | POA: Insufficient documentation

## 2021-10-19 DIAGNOSIS — M1611 Unilateral primary osteoarthritis, right hip: Secondary | ICD-10-CM | POA: Insufficient documentation

## 2021-10-19 DIAGNOSIS — F3341 Major depressive disorder, recurrent, in partial remission: Secondary | ICD-10-CM | POA: Insufficient documentation

## 2021-10-19 DIAGNOSIS — F332 Major depressive disorder, recurrent severe without psychotic features: Secondary | ICD-10-CM | POA: Insufficient documentation

## 2021-10-21 ENCOUNTER — Ambulatory Visit: Payer: Medicare Other | Admitting: Cardiology

## 2021-10-21 ENCOUNTER — Other Ambulatory Visit: Payer: Self-pay

## 2021-10-21 ENCOUNTER — Encounter: Payer: Self-pay | Admitting: Cardiology

## 2021-10-21 VITALS — BP 160/78 | HR 65 | Ht 64.0 in | Wt 228.0 lb

## 2021-10-21 DIAGNOSIS — Z9861 Coronary angioplasty status: Secondary | ICD-10-CM | POA: Diagnosis not present

## 2021-10-21 DIAGNOSIS — R001 Bradycardia, unspecified: Secondary | ICD-10-CM | POA: Diagnosis not present

## 2021-10-21 DIAGNOSIS — I255 Ischemic cardiomyopathy: Secondary | ICD-10-CM | POA: Diagnosis not present

## 2021-10-21 DIAGNOSIS — I2510 Atherosclerotic heart disease of native coronary artery without angina pectoris: Secondary | ICD-10-CM

## 2021-10-21 DIAGNOSIS — R002 Palpitations: Secondary | ICD-10-CM | POA: Diagnosis not present

## 2021-10-21 MED ORDER — FUROSEMIDE 20 MG PO TABS: 20.0000 mg | ORAL_TABLET | Freq: Every day | ORAL | 3 refills | Status: DC

## 2021-10-21 NOTE — Patient Instructions (Signed)
Medication Instructions:  Your physician has recommended you make the following change in your medication:   START: Lasix 20 mg daily  *If you need a refill on your cardiac medications before your next appointment, please call your pharmacy*   Lab Work: Your physician recommends that you return for lab work in:   Labs today [place] in 1 week: BMP  If you have labs (blood work) drawn today and your tests are completely normal, you will receive your results only by: [place] (if you have MyChart) OR A paper copy in the mail If you have any lab test that is abnormal or we need to change your treatment, we will call you to review the results.   Testing/Procedures: Your physician has requested that you have an echocardiogram. Echocardiography is a painless test that uses sound waves to create images of your heart. It provides your doctor with information about the size and shape of your heart and how well your hearts chambers and valves are working. This procedure takes approximately one hour. There are no restrictions for this procedure.    Follow-Up: At [facility], you and your health needs are our priority.  As part of our continuing mission to provide you with exceptional heart care, we have created designated Provider Care Teams.  These Care Teams include your primary Cardiologist (physician) and Advanced Practice Providers (APPs -  Physician Assistants and Nurse Practitioners) who all work together to provide you with the care you need, when you need it.  We recommend signing up for the patient portal called "MyChart".  Sign up information is provided on this After Visit Summary.  MyChart is used to connect with patients for Virtual Visits (Telemedicine).  Patients are able to view lab/test results, encounter notes, upcoming appointments, etc.  Non-urgent messages can be sent to your provider as well.   To learn more about what you can do with MyChart, go to  [email or web address].    Your next appointment:   3 month(s)  The format for your next appointment:   In Person  Provider:   Jenne Campus, MD    Other Instructions Echocardiogram An echocardiogram is a test that uses sound waves (ultrasound) to produce images of the heart. Images from an echocardiogram can provide important information about: Heart size and shape. The size and thickness and movement of your heart's walls. Heart muscle function and strength. Heart valve function or if you have stenosis. Stenosis is when the heart valves are too narrow. If blood is flowing backward through the heart valves (regurgitation). A tumor or infectious growth around the heart valves. Areas of heart muscle that are not working well because of poor blood flow or injury from a heart attack. Aneurysm detection. An aneurysm is a weak or damaged part of an artery wall. The wall bulges out from the normal force of blood pumping through the body. Tell a health care provider about: Any allergies you have. All medicines you are taking, including vitamins, herbs, eye drops, creams, and over-the-counter medicines. Any blood disorders you have. Any surgeries you have had. Any medical conditions you have. Whether you are pregnant or may be pregnant. What are the risks? Generally, this is a safe test. However, problems may occur, including an allergic reaction to dye (contrast) that may be used during the test. What happens before the test? No specific preparation is needed. You may eat and drink normally. What happens during the test?  You will take off your clothes  from the waist up and put on a hospital gown. Electrodes or electrocardiogram (ECG)patches may be placed on your chest. The electrodes or patches are then connected to a device that monitors your heart rate and rhythm. You will lie down on a table for an ultrasound exam. A gel will be applied to your chest to help sound waves pass  through your skin. A handheld device, called a transducer, will be pressed against your chest and moved over your heart. The transducer produces sound waves that travel to your heart and bounce back (or "echo" back) to the transducer. These sound waves will be captured in real-time and changed into images of your heart that can be viewed on a video monitor. The images will be recorded on a computer and reviewed by your health care provider. You may be asked to change positions or hold your breath for a short time. This makes it easier to get different views or better views of your heart. In some cases, you may receive contrast through an IV in one of your veins. This can improve the quality of the pictures from your heart. The procedure may vary among health care providers and hospitals. What can I expect after the test? You may return to your normal, everyday life, including diet, activities, and medicines, unless your health care provider tells you not to do that. Follow these instructions at home: It is up to you to get the results of your test. Ask your health care provider, or the department that is doing the test, when your results will be ready. Keep all follow-up visits. This is important. Summary An echocardiogram is a test that uses sound waves (ultrasound) to produce images of the heart. Images from an echocardiogram can provide important information about the size and shape of your heart, heart muscle function, heart valve function, and other possible heart problems. You do not need to do anything to prepare before this test. You may eat and drink normally. After the echocardiogram is completed, you may return to your normal, everyday life, unless your health care provider tells you not to do that. This information is not intended to replace advice given to you by your health care provider. Make sure you discuss any questions you have with your health care provider. Document Revised:  06/03/2021 Document Reviewed: 05/13/2020 Elsevier Patient Education  2022 [employer].

## 2021-10-21 NOTE — Addendum Note (Signed)
Addended by: Edwyna Shell I on: 10/21/2021 02:46 PM   Modules accepted: Orders

## 2021-10-21 NOTE — Progress Notes (Signed)
Cardiology Office Note:    Date:  10/21/2021   ID:  Melissa Clements, DOB 1946-09-09, MRN [id number]  PCP:  [name], MD  Cardiologist:  Jenne Campus, MD    Referring MD: [name], MD   No chief complaint on file. Doing fine  History of Present Illness:    Melissa Clements is a 76 y.o. female   with past medical history significant for coronary artery disease, status post PTCA and stenting of the left anterior descending artery in face of late presentation of myocardial infarction.  She also have history of dyslipidemia, paroxysmal atrial fibrillation, she is on Xarelto, cardiomyopathy however latest echocardiogram showed improvement left ventricle ejection fraction to normalization.  She comes today to my office for follow-up.  Overall she seems to be doing well but she noticed swelling of lower extremities at evening time also she does have new diagnosis she was diagnosed with hemochromatosis  Past Medical History:  Diagnosis Date   Arthritis    end stage right hip   Basal cell carcinoma    Benign essential HTN 01/23/2015   CAD S/P percutaneous coronary angioplasty 11/06/2019   LAD PCI with DES 11/02/2019   Chronic kidney disease    stage III, related to high blood pressure medication   Colon polyp    CRI (chronic renal insufficiency), stage 3 (moderate) (HCC) 11/06/2019   No ACE or ARB   Depression    Dizziness 01/23/2015   Dyslipidemia, goal LDL below 70 11/06/2019   LDL 84- changed from Zocor to Lipitor 80 mg   Dyspnea on exertion 11/01/2019   Elevated liver enzymes 02/03/2021   Hematoma 11/06/2019   Hematoma and ecchymosis Rt radial site post PCI-(anticoagulation for new AF held).    History of chest pain    History of dizziness    History of palpitations    Hyperlipidemia    Hypertension    Ischemic cardiomyopathy 11/06/2019   EF 45%   NSTEMI (non-ST elevated myocardial infarction) ([place]) 11/01/2019   S/P NSTEMI 11/03/2019   Obese 01/25/2018   Palpitations 01/23/2015    Paroxysmal atrial fibrillation ([place]) 08/25/2020   Persistent atrial fibrillation ([place])    New diagnosis 11/03/2019- not anticoagulated yet- minimal symptoms- rate controlled   Precordial chest pain 01/23/2015   Negative stress test in the spring 2019   S/P right THA, AA 01/24/2018   Sinus bradycardia 01/20/2018    Past Surgical History:  Procedure Laterality Date   CARDIAC CATHETERIZATION     CARDIOVERSION N/A 01/29/2020   Procedure: CARDIOVERSION;  Surgeon: [name], MD;  Location: [facility] [facility];  Service: Cardiovascular;  Laterality: N/A;   CATARACT EXTRACTION, BILATERAL     COLONOSCOPY     CORONARY STENT INTERVENTION N/A 11/02/2019   Procedure: CORONARY STENT INTERVENTION;  Surgeon: Leonie Man, MD;  Location: [place] [facility];  Service: Cardiovascular;  Laterality: N/A;   DIAGNOSTIC LAPAROSCOPY     DILATION AND CURETTAGE OF UTERUS     LEFT HEART CATH AND CORONARY ANGIOGRAPHY N/A 11/02/2019   Procedure: LEFT HEART CATH AND CORONARY ANGIOGRAPHY;  Surgeon: Leonie Man, MD;  Location: [place] [facility];  Service: Cardiovascular;  Laterality: N/A;   TOTAL HIP ARTHROPLASTY Right 01/24/2018   Procedure: RIGHT TOTAL HIP ARTHROPLASTY ANTERIOR APPROACH;  Surgeon: Paralee Cancel, MD;  Location: [facility];  Service: Orthopedics;  Laterality: Right;  70 mins    Current Medications: Current Meds  Medication Sig   atorvastatin (LIPITOR) 80 MG tablet Take 0.5 tablets (  40 mg total) by mouth daily at 6 PM.   Cholecalciferol (VITAMIN D) 2000 units tablet Take 2,000 Units by mouth every other day.    docusate sodium (COLACE) 100 MG capsule Take 100 mg by mouth daily.   lisinopril (ZESTRIL) 2.5 MG tablet Take 1 tablet (2.5 mg total) by mouth daily.   metoprolol succinate (TOPROL-XL) 50 MG 24 hr tablet Take 0.5 tablets (25 mg total) by mouth daily. Hold if heart rate is <60 bpm   nitroGLYCERIN (NITROSTAT) 0.4 MG SL tablet Place 1 tablet (0.4 mg total) under the tongue every 5 (five)  minutes x 3 doses as needed for chest pain.   rivaroxaban (XARELTO) 20 MG TABS tablet Take 1 tablet (20 mg total) by mouth daily with supper.     Allergies:   Patient has no known allergies.   Social History   Socioeconomic History   Marital status: Married    Spouse name: [name]   Number of children: 1   Years of education: college   Highest education level: Associate degree: academic program  Occupational History   Occupation: retired  Tobacco Use   Smoking status: Never   Smokeless tobacco: Never  Scientific laboratory technician Use: Never used  Substance and Sexual Activity   Alcohol use: No    Alcohol/week: 0.0 standard drinks   Drug use: No   Sexual activity: Not on file  Other Topics Concern   Not on file  Social History Narrative   Not on file   Social Determinants of Health   Financial Resource Strain: Not on file  Food Insecurity: Not on file  Transportation Needs: Not on file  Physical Activity: Not on file  Stress: Not on file  Social Connections: Not on file     Family History: The patient's family history includes Alzheimer's disease in her brother and mother; Diabetes in her mother; Heart attack (age of onset: 85) in her father; Heart disease in her father. ROS:   Please see the history of present illness.    All 14 point review of systems negative except as described per history of present illness  EKGs/Labs/Other Studies Reviewed:      Recent Labs: 09/29/2021: ALT 80; BUN 21; Creatinine 1.12; Hemoglobin 13.2; Platelet Count 138; Potassium 4.0; Sodium 137  Recent Lipid Panel    Component Value Date/Time   CHOL 139 04/20/2021 1023   TRIG 57 04/20/2021 1023   HDL 62 04/20/2021 1023   CHOLHDL 2.2 04/20/2021 1023   CHOLHDL 2.6 11/02/2019 0344   VLDL 11 11/02/2019 0344   LDLCALC 65 04/20/2021 1023    Physical Exam:    VS:  There were no vitals taken for this visit.    Wt Readings from Last 3 Encounters:  09/29/21 235 lb 1.9 oz (106.6 kg)  06/29/21  244 lb 12.8 oz (111 kg)  04/29/21 237 lb 6.4 oz (107.7 kg)     GEN:  Well nourished, well developed in no acute distress HEENT: Normal NECK: No JVD; No carotid bruits LYMPHATICS: No lymphadenopathy CARDIAC: RRR, no murmurs, no rubs, no gallops RESPIRATORY:  Clear to auscultation without rales, wheezing or rhonchi  ABDOMEN: Soft, non-tender, non-distended MUSCULOSKELETAL:  No edema; No deformity  SKIN: Warm and dry LOWER EXTREMITIES: no swelling NEUROLOGIC:  Alert and oriented x 3 PSYCHIATRIC:  Normal affect   ASSESSMENT:    1. CAD S/P percutaneous coronary angioplasty   2. Ischemic cardiomyopathy   3. Sinus bradycardia   4. Hemochromatosis, unspecified hemochromatosis type  5. Palpitations    PLAN:    In order of problems listed above:  Coronary disease stable from that point review no chest pain tightness squeezing pressure burning chest overall seems to be doing well Swelling of lower extremities obviously concerning I will give her 20 mg Lasix we will check Chem-7 next week today to check proBNP.  She will be scheduled to have another echocardiogram to recheck left ventricle ejection fraction. Sinus bradycardia no dizziness no passing out overall appears to be not critical. Hemochromatosis followed by oncology team Palpitations denies having any Hypercholesterolemia: Uncontrolled when she comes to my office but she said at home always good.  Hopefully addition of Lasix will help with her blood pressure   Medication Adjustments/Labs and Tests Ordered: Current medicines are reviewed at length with the patient today.  Concerns regarding medicines are outlined above.  No orders of the defined types were placed in this encounter.  Medication changes: No orders of the defined types were placed in this encounter.   Signed, Park Liter, MD, [facility] 10/21/2021 2:20 PM    [place]

## 2021-10-22 LAB — PRO B NATRIURETIC PEPTIDE: NT-Pro BNP: 141 pg/mL (ref 0–738)

## 2021-10-23 ENCOUNTER — Telehealth: Payer: Self-pay

## 2021-10-23 NOTE — Telephone Encounter (Signed)
Patient notified of results.

## 2021-10-23 NOTE — Telephone Encounter (Signed)
-----   Message from Park Liter, MD sent at 10/23/2021 12:22 PM EST ----- Chem-7 looks good, continue present management

## 2021-10-28 DIAGNOSIS — I2510 Atherosclerotic heart disease of native coronary artery without angina pectoris: Secondary | ICD-10-CM | POA: Diagnosis not present

## 2021-10-28 DIAGNOSIS — I255 Ischemic cardiomyopathy: Secondary | ICD-10-CM | POA: Diagnosis not present

## 2021-10-28 DIAGNOSIS — Z9861 Coronary angioplasty status: Secondary | ICD-10-CM | POA: Diagnosis not present

## 2021-10-28 DIAGNOSIS — R001 Bradycardia, unspecified: Secondary | ICD-10-CM | POA: Diagnosis not present

## 2021-10-31 ENCOUNTER — Encounter: Payer: Self-pay | Admitting: Cardiology

## 2021-11-01 LAB — BASIC METABOLIC PANEL
BUN/Creatinine Ratio: 21 (ref 12–28)
BUN: 25 mg/dL (ref 8–27)
CO2: 20 mmol/L (ref 20–29)
Calcium: 9 mg/dL (ref 8.7–10.3)
Chloride: 101 mmol/L (ref 96–106)
Creatinine, Ser: 1.17 mg/dL — ABNORMAL HIGH (ref 0.57–1.00)
Glucose: 93 mg/dL (ref 70–99)
Potassium: 4.2 mmol/L (ref 3.5–5.2)
Sodium: 138 mmol/L (ref 134–144)
eGFR: 48 mL/min/{1.73_m2} — ABNORMAL LOW (ref >59)

## 2021-11-03 ENCOUNTER — Telehealth: Payer: Self-pay

## 2021-11-03 NOTE — Telephone Encounter (Signed)
-----   Message from Park Liter, MD sent at 10/30/2021 10:36 AM EST ----- Labs are good, continue present management

## 2021-11-03 NOTE — Telephone Encounter (Signed)
Patient notified of results.

## 2021-11-10 ENCOUNTER — Inpatient Hospital Stay: Payer: Medicare Other | Attending: Hematology & Oncology

## 2021-11-10 ENCOUNTER — Other Ambulatory Visit: Payer: Self-pay

## 2021-11-10 LAB — CMP (CANCER CENTER ONLY)
ALT: 73 U/L — ABNORMAL HIGH (ref 0–44)
AST: 161 U/L — ABNORMAL HIGH (ref 15–41)
Albumin: 3.3 g/dL — ABNORMAL LOW (ref 3.5–5.0)
Alkaline Phosphatase: 110 U/L (ref 38–126)
Anion gap: 6 (ref 5–15)
BUN: 19 mg/dL (ref 8–23)
CO2: 26 mmol/L (ref 22–32)
Calcium: 9.4 mg/dL (ref 8.9–10.3)
Chloride: 105 mmol/L (ref 98–111)
Creatinine: 0.98 mg/dL (ref 0.44–1.00)
GFR, Estimated: 60 mL/min — ABNORMAL LOW (ref >60)
Glucose, Bld: 95 mg/dL (ref 70–99)
Potassium: 4.1 mmol/L (ref 3.5–5.1)
Sodium: 137 mmol/L (ref 135–145)
Total Bilirubin: 0.9 mg/dL (ref 0.3–1.2)
Total Protein: 6.6 g/dL (ref 6.5–8.1)

## 2021-11-10 LAB — CBC WITH DIFFERENTIAL (CANCER CENTER ONLY)
Abs Immature Granulocytes: 0.01 10*3/uL (ref 0.00–0.07)
Basophils Absolute: 0 10*3/uL (ref 0.0–0.1)
Basophils Relative: 1 %
Eosinophils Absolute: 0.3 10*3/uL (ref 0.0–0.5)
Eosinophils Relative: 7 %
HCT: 39.8 % (ref 36.0–46.0)
Hemoglobin: 12.8 g/dL (ref 12.0–15.0)
Immature Granulocytes: 0 %
Lymphocytes Relative: 39 %
Lymphs Abs: 1.6 10*3/uL (ref 0.7–4.0)
MCH: 28.6 pg (ref 26.0–34.0)
MCHC: 32.2 g/dL (ref 30.0–36.0)
MCV: 88.8 fL (ref 80.0–100.0)
Monocytes Absolute: 0.5 10*3/uL (ref 0.1–1.0)
Monocytes Relative: 12 %
Neutro Abs: 1.7 10*3/uL (ref 1.7–7.7)
Neutrophils Relative %: 41 %
Platelet Count: 109 10*3/uL — ABNORMAL LOW (ref 150–400)
RBC: 4.48 MIL/uL (ref 3.87–5.11)
RDW: 17.2 % — ABNORMAL HIGH (ref 11.5–15.5)
WBC Count: 4.2 10*3/uL (ref 4.0–10.5)
nRBC: 0 % (ref 0.0–0.2)

## 2021-11-10 LAB — IRON AND IRON BINDING CAPACITY (CC-WL,HP ONLY)
Iron: 80 ug/dL (ref 28–170)
Saturation Ratios: 20 % (ref 10.4–31.8)
TIBC: 395 ug/dL (ref 250–450)
UIBC: 315 ug/dL (ref 148–442)

## 2021-11-11 LAB — FERRITIN: Ferritin: 36 ng/mL (ref 11–307)

## 2021-11-13 ENCOUNTER — Other Ambulatory Visit: Payer: Self-pay

## 2021-11-13 ENCOUNTER — Ambulatory Visit (HOSPITAL_BASED_OUTPATIENT_CLINIC_OR_DEPARTMENT_OTHER)
Admission: RE | Admit: 2021-11-13 | Discharge: 2021-11-13 | Disposition: A | Discharge status: Home / Self Care | Payer: Medicare Other | Source: Ambulatory Visit | Attending: Cardiology | Admitting: Cardiology

## 2021-11-13 DIAGNOSIS — R002 Palpitations: Secondary | ICD-10-CM

## 2021-11-13 DIAGNOSIS — I2510 Atherosclerotic heart disease of native coronary artery without angina pectoris: Secondary | ICD-10-CM

## 2021-11-13 DIAGNOSIS — R001 Bradycardia, unspecified: Secondary | ICD-10-CM

## 2021-11-13 DIAGNOSIS — I255 Ischemic cardiomyopathy: Secondary | ICD-10-CM | POA: Diagnosis not present

## 2021-11-13 DIAGNOSIS — Z9861 Coronary angioplasty status: Secondary | ICD-10-CM | POA: Diagnosis not present

## 2021-11-13 LAB — ECHOCARDIOGRAM COMPLETE
AR max vel: 2.16 cm2
AV Area VTI: 2.35 cm2
AV Area mean vel: 2.24 cm2
AV Mean grad: 6 mmHg
AV Peak grad: 10.6 mmHg
Ao pk vel: 1.63 m/s
Area-P 1/2: 2.5 cm2
S' Lateral: 2.5 cm

## 2021-11-13 NOTE — Progress Notes (Signed)
°  Echocardiogram 2D Echocardiogram has been performed.  Merrie Roof F 11/13/2021, 2:04 PM

## 2021-12-12 ENCOUNTER — Other Ambulatory Visit: Payer: Self-pay | Admitting: Cardiology

## 2021-12-12 ENCOUNTER — Encounter: Payer: Self-pay | Admitting: Cardiology

## 2021-12-14 ENCOUNTER — Other Ambulatory Visit: Payer: Self-pay

## 2021-12-14 MED ORDER — RIVAROXABAN 20 MG PO TABS: 20.0000 mg | ORAL_TABLET | Freq: Every day | ORAL | 11 refills | Status: DC

## 2021-12-14 NOTE — Telephone Encounter (Signed)
Prescription refill request for Xarelto received.  ?Indication:Afib ?Last office visit:1/23 ?Weight:103.4 kg ?Age:76 ?Scr:0.9 ?CrCl:86.8 ml/min ? ?Prescription refilled ? ?

## 2021-12-22 ENCOUNTER — Telehealth: Payer: Self-pay | Admitting: *Deleted

## 2021-12-22 ENCOUNTER — Inpatient Hospital Stay: Payer: Medicare Other

## 2021-12-22 ENCOUNTER — Inpatient Hospital Stay: Payer: Medicare Other | Attending: Hematology & Oncology | Admitting: Family

## 2021-12-22 ENCOUNTER — Encounter: Payer: Self-pay | Admitting: Family

## 2021-12-22 ENCOUNTER — Other Ambulatory Visit: Payer: Self-pay

## 2021-12-22 ENCOUNTER — Telehealth: Payer: Self-pay

## 2021-12-22 DIAGNOSIS — Z79899 Other long term (current) drug therapy: Secondary | ICD-10-CM | POA: Diagnosis not present

## 2021-12-22 DIAGNOSIS — R002 Palpitations: Secondary | ICD-10-CM | POA: Diagnosis not present

## 2021-12-22 DIAGNOSIS — R5383 Other fatigue: Secondary | ICD-10-CM | POA: Diagnosis not present

## 2021-12-22 LAB — CBC WITH DIFFERENTIAL (CANCER CENTER ONLY)
Abs Immature Granulocytes: 0.01 10*3/uL (ref 0.00–0.07)
Basophils Absolute: 0.1 10*3/uL (ref 0.0–0.1)
Basophils Relative: 1 %
Eosinophils Absolute: 0.6 10*3/uL — ABNORMAL HIGH (ref 0.0–0.5)
Eosinophils Relative: 10 %
HCT: 45 % (ref 36.0–46.0)
Hemoglobin: 14.5 g/dL (ref 12.0–15.0)
Immature Granulocytes: 0 %
Lymphocytes Relative: 52 %
Lymphs Abs: 3.2 10*3/uL (ref 0.7–4.0)
MCH: 29.2 pg (ref 26.0–34.0)
MCHC: 32.2 g/dL (ref 30.0–36.0)
MCV: 90.7 fL (ref 80.0–100.0)
Monocytes Absolute: 0.8 10*3/uL (ref 0.1–1.0)
Monocytes Relative: 13 %
Neutro Abs: 1.5 10*3/uL — ABNORMAL LOW (ref 1.7–7.7)
Neutrophils Relative %: 24 %
Platelet Count: 120 10*3/uL — ABNORMAL LOW (ref 150–400)
RBC: 4.96 MIL/uL (ref 3.87–5.11)
RDW: 17.9 % — ABNORMAL HIGH (ref 11.5–15.5)
WBC Count: 6.2 10*3/uL (ref 4.0–10.5)
nRBC: 0 % (ref 0.0–0.2)

## 2021-12-22 LAB — CMP (CANCER CENTER ONLY)
ALT: 84 U/L — ABNORMAL HIGH (ref 0–44)
AST: 187 U/L (ref 15–41)
Albumin: 3.7 g/dL (ref 3.5–5.0)
Alkaline Phosphatase: 120 U/L (ref 38–126)
Anion gap: 8 (ref 5–15)
BUN: 21 mg/dL (ref 8–23)
CO2: 26 mmol/L (ref 22–32)
Calcium: 10.2 mg/dL (ref 8.9–10.3)
Chloride: 107 mmol/L (ref 98–111)
Creatinine: 1.09 mg/dL — ABNORMAL HIGH (ref 0.44–1.00)
GFR, Estimated: 53 mL/min — ABNORMAL LOW (ref >60)
Glucose, Bld: 95 mg/dL (ref 70–99)
Potassium: 4.5 mmol/L (ref 3.5–5.1)
Sodium: 141 mmol/L (ref 135–145)
Total Bilirubin: 0.8 mg/dL (ref 0.3–1.2)
Total Protein: 7.6 g/dL (ref 6.5–8.1)

## 2021-12-22 LAB — IRON AND IRON BINDING CAPACITY (CC-WL,HP ONLY)
Iron: 70 ug/dL (ref 28–170)
Saturation Ratios: 16 % (ref 10.4–31.8)
TIBC: 431 ug/dL (ref 250–450)
UIBC: 361 ug/dL (ref 148–442)

## 2021-12-22 LAB — FERRITIN: Ferritin: 46 ng/mL (ref 11–307)

## 2021-12-22 NOTE — Progress Notes (Signed)
?Hematology and Oncology Follow Up Visit ? ?Melissa Clements ?[id number] ?1946-02-08 76 y.o. ?12/22/2021 ? ? ?Principle Diagnosis:  ?Hemochromatosis, heterozygous for the C282Y mutation ?  ?Current Therapy:        ?Phlebotomy to maintain ferritin < 100 and iron saturation < 50% ?  ?Interim History:  Melissa Clements is here today for follow-up. She is doing well and has no new complaints at this time. She still notes fatigue and palpitations with over exertion and will just take time to rest when needed.  ?No fever, chills, n/v, cough, rash, SOB, chest pain, abdominal pain or changes in bowel or bladder habits.  ?No swelling, tenderness, numbness or tingling in her extremities at this time. She wears compression stocking regularly to prevent fluid retention.  ?No falls or syncope to report.  ?She has maintained a good appetite but admits that she needs to better hydrated throughout the day. Her weight is stable at 222 lbs.  ? ?ECOG Performance Status: 1 - Symptomatic but completely ambulatory ? ?Medications:  ?Allergies as of 12/22/2021   ?No Known Allergies ?  ? ?  ?Medication List  ?  ? ?  ? Accurate as of December 22, 2021 10:32 AM. If you have any questions, ask your nurse or doctor.  ?  ?  ? ?  ? ?atorvastatin 80 MG tablet ?Commonly known as: LIPITOR ?Take 0.5 tablets (40 mg total) by mouth daily at 6 PM. ?  ?docusate sodium 100 MG capsule ?Commonly known as: COLACE ?Take 100 mg by mouth daily. ?  ?furosemide 20 MG tablet ?Commonly known as: LASIX ?Take 1 tablet (20 mg total) by mouth daily. ?  ?lisinopril 2.5 MG tablet ?Commonly known as: ZESTRIL ?Take 1 tablet (2.5 mg total) by mouth daily. ?  ?metoprolol succinate 50 MG 24 hr tablet ?Commonly known as: TOPROL-XL ?Take 0.5 tablets (25 mg total) by mouth daily. Hold if heart rate is <60 bpm ?  ?nitroGLYCERIN 0.4 MG SL tablet ?Commonly known as: NITROSTAT ?Place 1 tablet (0.4 mg total) under the tongue every 5 (five) minutes x 3 doses as needed for chest pain. ?   ?rivaroxaban 20 MG Tabs tablet ?Commonly known as: XARELTO ?Take 1 tablet (20 mg total) by mouth daily with supper. ?  ?Vitamin D 50 MCG (2000 UT) tablet ?Take 2,000 Units by mouth every other day. ?  ? ?  ? ? ?Allergies: No Known Allergies ? ?Past Medical History, Surgical history, Social history, and Family History were reviewed and updated. ? ?Review of Systems: ?All other 10 point review of systems is negative.  ? ?Physical Exam: ? vitals were not taken for this visit.  ? ?Wt Readings from Last 3 Encounters:  ?10/21/21 228 lb (103.4 kg)  ?09/29/21 235 lb 1.9 oz (106.6 kg)  ?06/29/21 244 lb 12.8 oz (111 kg)  ? ? ?Ocular: Sclerae unicteric, pupils equal, round and reactive to light ?Ear-nose-throat: Oropharynx clear, dentition fair ?Lymphatic: No cervical or supraclavicular adenopathy ?Lungs no rales or rhonchi, good excursion bilaterally ?Heart regular rate and rhythm, no murmur appreciated ?Abd soft, nontender, positive bowel sounds ?MSK no focal spinal tenderness, no joint edema ?Neuro: non-focal, well-oriented, appropriate affect ?Breasts: Deferred  ? ?Lab Results  ?Component Value Date  ? WBC 6.2 12/22/2021  ? HGB 14.5 12/22/2021  ? HCT 45.0 12/22/2021  ? MCV 90.7 12/22/2021  ? PLT 120 (L) 12/22/2021  ? ?Lab Results  ?Component Value Date  ? FERRITIN 36 11/10/2021  ? IRON 80 11/10/2021  ? TIBC  395 11/10/2021  ? UIBC 315 11/10/2021  ? IRONPCTSAT 20 11/10/2021  ? ?Lab Results  ?Component Value Date  ? RETICCTPCT 1.5 04/29/2021  ? RBC 4.96 12/22/2021  ? ?No results found for: KPAFRELGTCHN, LAMBDASER, KAPLAMBRATIO ?No results found for: IGGSERUM, IGA, IGMSERUM ?No results found for: TOTALPROTELP, ALBUMINELP, A1GS, A2GS, BETS, BETA2SER, GAMS, MSPIKE, SPEI ?  Chemistry   ?   ?Component Value Date/Time  ? NA 137 11/10/2021 1133  ? NA 138 10/28/2021 1402  ? K 4.1 11/10/2021 1133  ? CL 105 11/10/2021 1133  ? CO2 26 11/10/2021 1133  ? BUN 19 11/10/2021 1133  ? BUN 25 10/28/2021 1402  ? CREATININE 0.98 11/10/2021 1133   ?    ?Component Value Date/Time  ? CALCIUM 9.4 11/10/2021 1133  ? ALKPHOS 110 11/10/2021 1133  ? AST 161 (H) 11/10/2021 1133  ? ALT 73 (H) 11/10/2021 1133  ? BILITOT 0.9 11/10/2021 1133  ?  ? ? ? ?Impression and Plan: Melissa Clements is a very pleasant 76 yo caucasian female with recent diagnosis of hemochromatosis, heterozygous for the C282Y mutation.  ?Iron studies are pending. We will schedule her for phlebotomy if needed.  ?Follow-up in 4 months.  ? ?Lottie Dawson, NP ?3/21/202310:32 AM ? ?

## 2021-12-22 NOTE — Telephone Encounter (Signed)
Per 12/22/21 los - gave upcoming appointments - confirmed ?

## 2021-12-22 NOTE — Telephone Encounter (Signed)
Critical AST of 187 received from lab, MD aware.  ?

## 2021-12-25 ENCOUNTER — Ambulatory Visit: Payer: Medicare Other | Admitting: Cardiology

## 2021-12-25 ENCOUNTER — Other Ambulatory Visit: Payer: Self-pay

## 2021-12-25 ENCOUNTER — Encounter: Payer: Self-pay | Admitting: Cardiology

## 2021-12-25 VITALS — BP 134/69 | HR 75 | Ht 63.25 in | Wt 220.0 lb

## 2021-12-25 DIAGNOSIS — E785 Hyperlipidemia, unspecified: Secondary | ICD-10-CM | POA: Diagnosis not present

## 2021-12-25 DIAGNOSIS — I2510 Atherosclerotic heart disease of native coronary artery without angina pectoris: Secondary | ICD-10-CM | POA: Diagnosis not present

## 2021-12-25 DIAGNOSIS — I255 Ischemic cardiomyopathy: Secondary | ICD-10-CM

## 2021-12-25 DIAGNOSIS — Z9861 Coronary angioplasty status: Secondary | ICD-10-CM | POA: Diagnosis not present

## 2021-12-25 DIAGNOSIS — R7989 Other specified abnormal findings of blood chemistry: Secondary | ICD-10-CM | POA: Insufficient documentation

## 2021-12-25 DIAGNOSIS — R748 Abnormal levels of other serum enzymes: Secondary | ICD-10-CM

## 2021-12-25 DIAGNOSIS — R001 Bradycardia, unspecified: Secondary | ICD-10-CM

## 2021-12-25 NOTE — Patient Instructions (Signed)
Medication Instructions:  ?Your physician has recommended you make the following change in your medication:  ? ?STOP: Lipitor ? ?*If you need a refill on your cardiac medications before your next appointment, please call your pharmacy* ? ? ?Lab Work: ?Your physician recommends that you return for lab work in:  ? ?Labs in 4 weeks: LFT's ? ?If you have labs (blood work) drawn today and your tests are completely normal, you will receive your results only by: ?MyChart Message (if you have MyChart) OR ?A paper copy in the mail ?If you have any lab test that is abnormal or we need to change your treatment, we will call you to review the results. ? ? ?Testing/Procedures: ?None ? ? ?Follow-Up: ?At [facility], you and your health needs are our priority.  As part of our continuing mission to provide you with exceptional heart care, we have created designated Provider Care Teams.  These Care Teams include your primary Cardiologist (physician) and Advanced Practice Providers (APPs -  Physician Assistants and Nurse Practitioners) who all work together to provide you with the care you need, when you need it. ? ?We recommend signing up for the patient portal called "MyChart".  Sign up information is provided on this After Visit Summary.  MyChart is used to connect with patients for Virtual Visits (Telemedicine).  Patients are able to view lab/test results, encounter notes, upcoming appointments, etc.  Non-urgent messages can be sent to your provider as well.   ?To learn more about what you can do with MyChart, go to [email or web address].   ? ?Your next appointment:   ?5 month(s) ? ?The format for your next appointment:   ?In Person ? ?Provider:   ?Jenne Campus, MD  ? ? ?Other Instructions ?None ? ?

## 2021-12-25 NOTE — Progress Notes (Signed)
?Cardiology Office Note:   ? ?Date:  12/25/2021  ? ?ID:  Melissa Clements, DOB Oct 07, 1945, MRN [id number] ? ?PCP:  [name], MD  ?Cardiologist:  Jenne Campus, MD   ? ?Referring MD: [name], MD  ? ? ? ?History of Present Illness:   ? ?Melissa Clements is a 76 y.o. female    with past medical history significant for coronary artery disease, status post PTCA and stenting of the left anterior descending artery in face of late presentation of myocardial infarction.  She also have history of dyslipidemia, paroxysmal atrial fibrillation, she is on Xarelto, cardiomyopathy however latest echocardiogram showed improvement left ventricle ejection fraction to normalization. ?Overall cardiac wise doing well she said she described to have rare episode of chest pain she got upset with her daughter.  Otherwise walking does not bring it up another new symptom is dizziness she said at one time she woke up in the morning she got up in the room started spinning when she look right was spinning to the right she had 1 more episode like this couple weeks later and since that time nothing more ? ?Past Medical History:  ?Diagnosis Date  ? Arthritis   ? end stage right hip  ? Basal cell carcinoma   ? Benign essential HTN 01/23/2015  ? CAD S/P percutaneous coronary angioplasty 11/06/2019  ? LAD PCI with DES 11/02/2019  ? Chronic kidney disease   ? stage III, related to high blood pressure medication  ? Colon polyp   ? CRI (chronic renal insufficiency), stage 3 (moderate) ([place]) 11/06/2019  ? No ACE or ARB  ? Depression   ? Dizziness 01/23/2015  ? Dyslipidemia, goal LDL below 70 11/06/2019  ? LDL 84- changed from Zocor to Lipitor 80 mg  ? Dyspnea on exertion 11/01/2019  ? Elevated liver enzymes 02/03/2021  ? Hematoma 11/06/2019  ? Hematoma and ecchymosis Rt radial site post PCI-(anticoagulation for new AF held).   ? History of chest pain   ? History of dizziness   ? History of palpitations   ? Hyperlipidemia   ? Hypertension   ? Ischemic  cardiomyopathy 11/06/2019  ? EF 45%  ? NSTEMI (non-ST elevated myocardial infarction) ([place]) 11/01/2019  ? S/P NSTEMI 11/03/2019  ? Obese 01/25/2018  ? Palpitations 01/23/2015  ? Paroxysmal atrial fibrillation ([place]) 08/25/2020  ? Persistent atrial fibrillation ([place])   ? New diagnosis 11/03/2019- not anticoagulated yet- minimal symptoms- rate controlled  ? Precordial chest pain 01/23/2015  ? Negative stress test in the spring 2019  ? S/P right THA, AA 01/24/2018  ? Sinus bradycardia 01/20/2018  ? ? ?Past Surgical History:  ?Procedure Laterality Date  ? CARDIAC CATHETERIZATION    ? CARDIOVERSION N/A 01/29/2020  ? Procedure: CARDIOVERSION;  Surgeon: [name], MD;  Location: [place] [facility];  Service: Cardiovascular;  Laterality: N/A;  ? CATARACT EXTRACTION, BILATERAL    ? COLONOSCOPY    ? CORONARY STENT INTERVENTION N/A 11/02/2019  ? Procedure: CORONARY STENT INTERVENTION;  Surgeon: Leonie Man, MD;  Location: [facility];  Service: Cardiovascular;  Laterality: N/A;  ? DIAGNOSTIC LAPAROSCOPY    ? DILATION AND CURETTAGE OF UTERUS    ? LEFT HEART CATH AND CORONARY ANGIOGRAPHY N/A 11/02/2019  ? Procedure: LEFT HEART CATH AND CORONARY ANGIOGRAPHY;  Surgeon: Leonie Man, MD;  Location: [place];  Service: Cardiovascular;  Laterality: N/A;  ? TOTAL HIP ARTHROPLASTY Right 01/24/2018  ? Procedure: RIGHT TOTAL HIP ARTHROPLASTY ANTERIOR APPROACH;  Surgeon:  Paralee Cancel, MD;  Location: [facility];  Service: Orthopedics;  Laterality: Right;  70 mins  ? ? ?Current Medications: ?Current Meds  ?Medication Sig  ? atorvastatin (LIPITOR) 80 MG tablet Take 0.5 tablets (40 mg total) by mouth daily at 6 PM.  ? Cholecalciferol (VITAMIN D) 2000 units tablet Take 2,000 Units by mouth every other day.   ? docusate sodium (COLACE) 100 MG capsule Take 100 mg by mouth daily.  ? furosemide (LASIX) 20 MG tablet Take 1 tablet (20 mg total) by mouth daily. (Patient taking differently: Take 20 mg by mouth as needed for fluid.)  ?  lisinopril (ZESTRIL) 2.5 MG tablet Take 1 tablet (2.5 mg total) by mouth daily.  ? metoprolol succinate (TOPROL-XL) 50 MG 24 hr tablet Take 0.5 tablets (25 mg total) by mouth daily. Hold if heart rate is <60 bpm  ? nitroGLYCERIN (NITROSTAT) 0.4 MG SL tablet Place 1 tablet (0.4 mg total) under the tongue every 5 (five) minutes x 3 doses as needed for chest pain.  ?  ? ?Allergies:   Patient has no known allergies.  ? ?Social History  ? ?Socioeconomic History  ? Marital status: Married  ?  Spouse name: [name]  ? Number of children: 1  ? Years of education: college  ? Highest education level: Associate degree: academic program  ?Occupational History  ? Occupation: retired  ?Tobacco Use  ? Smoking status: Never  ? Smokeless tobacco: Never  ?Vaping Use  ? Vaping Use: Never used  ?Substance and Sexual Activity  ? Alcohol use: No  ?  Alcohol/week: 0.0 standard drinks  ? Drug use: No  ? Sexual activity: Not on file  ?Other Topics Concern  ? Not on file  ?Social History Narrative  ? Not on file  ? ?Social Determinants of Health  ? ?Financial Resource Strain: Not on file  ?Food Insecurity: Not on file  ?Transportation Needs: Not on file  ?Physical Activity: Not on file  ?Stress: Not on file  ?Social Connections: Not on file  ?  ? ?Family History: ?The patient's family history includes Alzheimer's disease in her brother and mother; Diabetes in her mother; Heart attack (age of onset: 64) in her father; Heart disease in her father. ?ROS:   ?Please see the history of present illness.    ?All 14 point review of systems negative except as described per history of present illness ? ?EKGs/Labs/Other Studies Reviewed:   ? ? ? ?Recent Labs: ?10/21/2021: NT-Pro BNP 141 ?12/22/2021: ALT 84; BUN 21; Creatinine 1.09; Hemoglobin 14.5; Platelet Count 120; Potassium 4.5; Sodium 141  ?Recent Lipid Panel ?   ?Component Value Date/Time  ? CHOL 139 04/20/2021 1023  ? TRIG 57 04/20/2021 1023  ? HDL 62 04/20/2021 1023  ? CHOLHDL 2.2 04/20/2021 1023  ?  CHOLHDL 2.6 11/02/2019 0344  ? VLDL 11 11/02/2019 0344  ? [place] 65 04/20/2021 1023  ? ? ?Physical Exam:   ? ?VS:  BP 134/69 (BP Location: Left Arm, Patient Position: Sitting)   Pulse 75   Ht 5' 3.25" (1.607 m)   Wt 220 lb (99.8 kg)   SpO2 97%   BMI 38.66 kg/m?    ? ?Wt Readings from Last 3 Encounters:  ?12/25/21 220 lb (99.8 kg)  ?12/22/21 222 lb (100.7 kg)  ?10/21/21 228 lb (103.4 kg)  ?  ? ?GEN:  Well nourished, well developed in no acute distress ?HEENT: Normal ?NECK: No JVD; No carotid bruits ?LYMPHATICS: No lymphadenopathy ?CARDIAC: RRR, no murmurs, no rubs,  no gallops ?RESPIRATORY:  Clear to auscultation without rales, wheezing or rhonchi  ?ABDOMEN: Soft, non-tender, non-distended ?MUSCULOSKELETAL:  No edema; No deformity  ?SKIN: Warm and dry ?LOWER EXTREMITIES: no swelling ?NEUROLOGIC:  Alert and oriented x 3 ?PSYCHIATRIC:  Normal affect  ? ?ASSESSMENT:   ? ?1. CAD S/P percutaneous coronary angioplasty   ?2. Ischemic cardiomyopathy   ?3. Sinus bradycardia   ?4. Dyslipidemia, goal LDL below 70   ?5. Elevated liver enzymes   ? ?PLAN:   ? ?In order of problems listed above: ? ?Coronary disease seems to be controlled.  It looks like she does have very rare episode of angina pectoris.  We will continue present management. ?Paroxysmal atrial fibrillation: Maintaining sinus rhythm.  EKG today shows sinus bradycardia, she is anticoagulant which I will continue. ?Dyslipidemia I did review K PN which show me LDL of 65 HDL 62 however she does have significant elevation of liver function test which gradually getting worse I asked her to stop Lipitor completely and then will check her fasting profile in 1 month. ?Sinus bradycardia asymptomatic dizziness looks clearly related to vertigo. ? ? ?Medication Adjustments/Labs and Tests Ordered: ?Current medicines are reviewed at length with the patient today.  Concerns regarding medicines are outlined above.  ?No orders of the defined types were placed in this  encounter. ? ?Medication changes: No orders of the defined types were placed in this encounter. ? ? ?Signed, ?Park Liter, MD, [facility] ?12/25/2021 3:29 PM    ?[place] ?

## 2022-01-04 DIAGNOSIS — E559 Vitamin D deficiency, unspecified: Secondary | ICD-10-CM | POA: Diagnosis not present

## 2022-01-04 DIAGNOSIS — E782 Mixed hyperlipidemia: Secondary | ICD-10-CM | POA: Diagnosis not present

## 2022-01-04 DIAGNOSIS — I10 Essential (primary) hypertension: Secondary | ICD-10-CM | POA: Diagnosis not present

## 2022-01-04 DIAGNOSIS — R7989 Other specified abnormal findings of blood chemistry: Secondary | ICD-10-CM | POA: Diagnosis not present

## 2022-01-07 DIAGNOSIS — I255 Ischemic cardiomyopathy: Secondary | ICD-10-CM | POA: Diagnosis not present

## 2022-01-07 DIAGNOSIS — R7989 Other specified abnormal findings of blood chemistry: Secondary | ICD-10-CM | POA: Diagnosis not present

## 2022-01-07 DIAGNOSIS — Z0000 Encounter for general adult medical examination without abnormal findings: Secondary | ICD-10-CM | POA: Diagnosis not present

## 2022-01-07 DIAGNOSIS — Z1389 Encounter for screening for other disorder: Secondary | ICD-10-CM | POA: Diagnosis not present

## 2022-01-07 DIAGNOSIS — I10 Essential (primary) hypertension: Secondary | ICD-10-CM | POA: Diagnosis not present

## 2022-01-07 DIAGNOSIS — Z23 Encounter for immunization: Secondary | ICD-10-CM | POA: Diagnosis not present

## 2022-01-07 DIAGNOSIS — I2510 Atherosclerotic heart disease of native coronary artery without angina pectoris: Secondary | ICD-10-CM | POA: Diagnosis not present

## 2022-01-07 DIAGNOSIS — E559 Vitamin D deficiency, unspecified: Secondary | ICD-10-CM | POA: Diagnosis not present

## 2022-01-08 ENCOUNTER — Ambulatory Visit: Payer: Medicare Other | Admitting: Cardiology

## 2022-01-08 ENCOUNTER — Other Ambulatory Visit (HOSPITAL_BASED_OUTPATIENT_CLINIC_OR_DEPARTMENT_OTHER): Payer: Self-pay | Admitting: Family Medicine

## 2022-01-08 DIAGNOSIS — M85859 Other specified disorders of bone density and structure, unspecified thigh: Secondary | ICD-10-CM

## 2022-01-12 ENCOUNTER — Ambulatory Visit (HOSPITAL_BASED_OUTPATIENT_CLINIC_OR_DEPARTMENT_OTHER)
Admission: RE | Admit: 2022-01-12 | Discharge: 2022-01-12 | Disposition: A | Discharge status: Home / Self Care | Payer: Medicare Other | Source: Ambulatory Visit | Attending: Family Medicine | Admitting: Family Medicine

## 2022-01-12 DIAGNOSIS — Z780 Asymptomatic menopausal state: Secondary | ICD-10-CM | POA: Insufficient documentation

## 2022-01-12 DIAGNOSIS — M85859 Other specified disorders of bone density and structure, unspecified thigh: Secondary | ICD-10-CM | POA: Insufficient documentation

## 2022-01-12 DIAGNOSIS — Z13820 Encounter for screening for osteoporosis: Secondary | ICD-10-CM | POA: Diagnosis not present

## 2022-01-21 DIAGNOSIS — E785 Hyperlipidemia, unspecified: Secondary | ICD-10-CM | POA: Diagnosis not present

## 2022-01-21 DIAGNOSIS — R748 Abnormal levels of other serum enzymes: Secondary | ICD-10-CM | POA: Diagnosis not present

## 2022-01-22 LAB — HEPATIC FUNCTION PANEL
ALT: 33 IU/L — ABNORMAL HIGH (ref 0–32)
AST: 78 IU/L — ABNORMAL HIGH (ref 0–40)
Albumin: 3.5 g/dL — ABNORMAL LOW (ref 3.7–4.7)
Alkaline Phosphatase: 114 IU/L (ref 44–121)
Bilirubin Total: 0.6 mg/dL (ref 0.0–1.2)
Bilirubin, Direct: 0.17 mg/dL (ref 0.00–0.40)
Total Protein: 6.5 g/dL (ref 6.0–8.5)

## 2022-03-05 DIAGNOSIS — R7989 Other specified abnormal findings of blood chemistry: Secondary | ICD-10-CM | POA: Diagnosis not present

## 2022-03-05 DIAGNOSIS — R945 Abnormal results of liver function studies: Secondary | ICD-10-CM | POA: Diagnosis not present

## 2022-03-08 ENCOUNTER — Encounter: Payer: Self-pay | Admitting: Cardiology

## 2022-03-08 ENCOUNTER — Encounter: Payer: Self-pay | Admitting: Family

## 2022-03-08 ENCOUNTER — Other Ambulatory Visit: Payer: Self-pay

## 2022-03-09 ENCOUNTER — Other Ambulatory Visit: Payer: Self-pay

## 2022-03-09 ENCOUNTER — Telehealth: Payer: Self-pay

## 2022-03-09 DIAGNOSIS — R748 Abnormal levels of other serum enzymes: Secondary | ICD-10-CM

## 2022-03-09 DIAGNOSIS — E785 Hyperlipidemia, unspecified: Secondary | ICD-10-CM

## 2022-03-09 MED ORDER — ATORVASTATIN CALCIUM 20 MG PO TABS: 20.0000 mg | ORAL_TABLET | Freq: Every day | ORAL | 3 refills | Status: DC

## 2022-03-09 NOTE — Progress Notes (Signed)
Lipid, AST, ALT one month, per RRR

## 2022-03-09 NOTE — Telephone Encounter (Signed)
Dr. Geraldo Pitter responded  "Start 20 mg and liver lipid check in 1 month.  And diet.  Thank you "   Sent message to pt via My Chart and called her. Pt agreed. Lipitor '20mg'$  sent to [facility] at pt request. Lab req sent to lab.

## 2022-03-09 NOTE — Telephone Encounter (Signed)
Spoke with pt. She saw GI in consult for increased liver function tests. Her GI is good with the statin - She was on Lipitor '40mg'$  down from '80mg'$  due to LFT's- at the dose prescribed by the Cardiologist with every 3 month LFT's. Is it ok for this pt to resume her Lipitor with a follow up Lipid and LFT's in 3 mo?. If so, should she restart on '40mg'$  or '80mg'$  q d?

## 2022-03-24 ENCOUNTER — Other Ambulatory Visit: Payer: Medicare Other

## 2022-03-24 ENCOUNTER — Ambulatory Visit: Payer: Medicare Other | Admitting: Family

## 2022-04-08 DIAGNOSIS — R7989 Other specified abnormal findings of blood chemistry: Secondary | ICD-10-CM | POA: Diagnosis not present

## 2022-04-08 DIAGNOSIS — E782 Mixed hyperlipidemia: Secondary | ICD-10-CM | POA: Diagnosis not present

## 2022-04-09 ENCOUNTER — Telehealth: Payer: Self-pay

## 2022-04-09 ENCOUNTER — Encounter: Payer: Self-pay | Admitting: Cardiology

## 2022-04-09 DIAGNOSIS — E785 Hyperlipidemia, unspecified: Secondary | ICD-10-CM

## 2022-04-09 LAB — HEPATIC FUNCTION PANEL
ALT: 38 IU/L — ABNORMAL HIGH (ref 0–32)
AST: 91 IU/L — ABNORMAL HIGH (ref 0–40)
Albumin: 3.6 g/dL — ABNORMAL LOW (ref 3.7–4.7)
Alkaline Phosphatase: 122 IU/L — ABNORMAL HIGH (ref 44–121)
Bilirubin Total: 0.6 mg/dL (ref 0.0–1.2)
Bilirubin, Direct: 0.17 mg/dL (ref 0.00–0.40)
Total Protein: 6.8 g/dL (ref 6.0–8.5)

## 2022-04-16 NOTE — Telephone Encounter (Signed)
-----   Message from Park Liter, MD sent at 04/15/2022  3:52 PM EDT ----- Probably starting very gently like pravastatin 20 will be good choices ----- Message ----- From: Darrel Reach, CMA Sent: 04/09/2022   4:50 PM EDT To: Park Liter, MD  Patient notified of results however she is want your advise on what meds or treatment is suitable for her cholesterol. She is not wanting to follow up with PCP want your recommendations. Please advise ----- Message ----- From: Park Liter, MD Sent: 04/09/2022  10:42 AM EDT To: Tyler Pita, RN  Liver function test mildly abnormal but stable.  Patient to follow-up with primary care physician.

## 2022-04-23 ENCOUNTER — Other Ambulatory Visit: Payer: Self-pay

## 2022-04-23 ENCOUNTER — Encounter: Payer: Self-pay | Admitting: Family

## 2022-04-23 ENCOUNTER — Inpatient Hospital Stay: Payer: Medicare Other | Admitting: Family

## 2022-04-23 ENCOUNTER — Inpatient Hospital Stay: Payer: Medicare Other | Attending: Hematology & Oncology

## 2022-04-23 DIAGNOSIS — R5383 Other fatigue: Secondary | ICD-10-CM | POA: Diagnosis not present

## 2022-04-23 DIAGNOSIS — M7989 Other specified soft tissue disorders: Secondary | ICD-10-CM | POA: Diagnosis not present

## 2022-04-23 DIAGNOSIS — Z79899 Other long term (current) drug therapy: Secondary | ICD-10-CM | POA: Diagnosis not present

## 2022-04-23 LAB — CMP (CANCER CENTER ONLY)
ALT: 54 U/L — ABNORMAL HIGH (ref 0–44)
AST: 128 U/L — ABNORMAL HIGH (ref 15–41)
Albumin: 3.5 g/dL (ref 3.5–5.0)
Alkaline Phosphatase: 99 U/L (ref 38–126)
Anion gap: 6 (ref 5–15)
BUN: 19 mg/dL (ref 8–23)
CO2: 26 mmol/L (ref 22–32)
Calcium: 9.5 mg/dL (ref 8.9–10.3)
Chloride: 107 mmol/L (ref 98–111)
Creatinine: 1.08 mg/dL — ABNORMAL HIGH (ref 0.44–1.00)
GFR, Estimated: 53 mL/min — ABNORMAL LOW (ref >60)
Glucose, Bld: 109 mg/dL — ABNORMAL HIGH (ref 70–99)
Potassium: 4.5 mmol/L (ref 3.5–5.1)
Sodium: 139 mmol/L (ref 135–145)
Total Bilirubin: 0.7 mg/dL (ref 0.3–1.2)
Total Protein: 6.6 g/dL (ref 6.5–8.1)

## 2022-04-23 LAB — CBC WITH DIFFERENTIAL (CANCER CENTER ONLY)
Abs Immature Granulocytes: 0.01 10*3/uL (ref 0.00–0.07)
Basophils Absolute: 0 10*3/uL (ref 0.0–0.1)
Basophils Relative: 1 %
Eosinophils Absolute: 0.3 10*3/uL (ref 0.0–0.5)
Eosinophils Relative: 7 %
HCT: 39.6 % (ref 36.0–46.0)
Hemoglobin: 12.8 g/dL (ref 12.0–15.0)
Immature Granulocytes: 0 %
Lymphocytes Relative: 39 %
Lymphs Abs: 1.7 10*3/uL (ref 0.7–4.0)
MCH: 29.9 pg (ref 26.0–34.0)
MCHC: 32.3 g/dL (ref 30.0–36.0)
MCV: 92.5 fL (ref 80.0–100.0)
Monocytes Absolute: 0.7 10*3/uL (ref 0.1–1.0)
Monocytes Relative: 15 %
Neutro Abs: 1.7 10*3/uL (ref 1.7–7.7)
Neutrophils Relative %: 38 %
Platelet Count: 106 10*3/uL — ABNORMAL LOW (ref 150–400)
RBC: 4.28 MIL/uL (ref 3.87–5.11)
RDW: 16.3 % — ABNORMAL HIGH (ref 11.5–15.5)
WBC Count: 4.3 10*3/uL (ref 4.0–10.5)
nRBC: 0 % (ref 0.0–0.2)

## 2022-04-23 LAB — IRON AND IRON BINDING CAPACITY (CC-WL,HP ONLY)
Iron: 57 ug/dL (ref 28–170)
Saturation Ratios: 15 % (ref 10.4–31.8)
TIBC: 371 ug/dL (ref 250–450)
UIBC: 314 ug/dL (ref 148–442)

## 2022-04-23 LAB — FERRITIN: Ferritin: 19 ng/mL (ref 11–307)

## 2022-04-23 NOTE — Progress Notes (Signed)
Hematology and Oncology Follow Up Visit  Melissa Clements [id number] 1945-11-16 76 y.o. 04/23/2022   Principle Diagnosis:  Hemochromatosis, heterozygous for the C282Y mutation   Current Therapy:        Phlebotomy to maintain ferritin < 100 and iron saturation < 50%   Interim History:  Melissa Clements is here today for follow-up. She is doing well but still notes fatigue at times.  Ferritin in June was stable at 19.9 per patient.  Her LFT's have remained elevated. She states that her cardiologist has reduced her dose of Lipitor and continues to monitor.  No fever, chills, n/v, cough, rash, dizziness, SOB, chest pain, palpitations, abdominal pain or changes in bowel or bladder habits.  No numbness or tingling in her extremities.  Chronic swelling in her lower extremities stable, unchanged from baseline.  No falls or syncope to report.  Appetite and hydration have been good. Her weight is stable at 217 lbs.   ECOG Performance Status: 0 - Asymptomatic  Medications:  Allergies as of 04/23/2022       Reactions   Pollen Extract         Medication List        Accurate as of April 23, 2022 10:32 AM. If you have any questions, ask your nurse or doctor.          atorvastatin 20 MG tablet Commonly known as: LIPITOR Take 1 tablet (20 mg total) by mouth daily.   docusate sodium 100 MG capsule Commonly known as: COLACE Take 100 mg by mouth daily.   furosemide 20 MG tablet Commonly known as: LASIX Take 1 tablet (20 mg total) by mouth daily. What changed:  when to take this reasons to take this   lisinopril 2.5 MG tablet Commonly known as: ZESTRIL Take 1 tablet (2.5 mg total) by mouth daily.   metoprolol succinate 50 MG 24 hr tablet Commonly known as: TOPROL-XL Take 0.5 tablets (25 mg total) by mouth daily. Hold if heart rate is <60 bpm   nitroGLYCERIN 0.4 MG SL tablet Commonly known as: NITROSTAT Place 1 tablet (0.4 mg total) under the tongue every 5 (five) minutes x 3  doses as needed for chest pain.   rivaroxaban 20 MG Tabs tablet Commonly known as: XARELTO Take 1 tablet (20 mg total) by mouth daily with supper.   Vitamin D 50 MCG (2000 UT) tablet Take 2,000 Units by mouth every other day.        Allergies:  Allergies  Allergen Reactions   Pollen Extract     Past Medical History, Surgical history, Social history, and Family History were reviewed and updated.  Review of Systems: All other 10 point review of systems is negative.   Physical Exam:  weight is 217 lb (98.4 kg). Her oral temperature is 97.7 F (36.5 C). Her blood pressure is 144/45 (abnormal) and her pulse is 60. Her respiration is 17 and oxygen saturation is 98%.   Wt Readings from Last 3 Encounters:  04/23/22 217 lb (98.4 kg)  12/25/21 220 lb (99.8 kg)  12/22/21 222 lb (100.7 kg)    Ocular: Sclerae unicteric, pupils equal, round and reactive to light Ear-nose-throat: Oropharynx clear, dentition fair Lymphatic: No cervical or supraclavicular adenopathy Lungs no rales or rhonchi, good excursion bilaterally Heart regular rate and rhythm, no murmur appreciated Abd soft, nontender, positive bowel sounds MSK no focal spinal tenderness, no joint edema Neuro: non-focal, well-oriented, appropriate affect Breasts: Deferred   Lab Results  Component Value Date  WBC 4.3 04/23/2022   HGB 12.8 04/23/2022   HCT 39.6 04/23/2022   MCV 92.5 04/23/2022   PLT 106 (L) 04/23/2022   Lab Results  Component Value Date   FERRITIN 46 12/22/2021   IRON 70 12/22/2021   TIBC 431 12/22/2021   UIBC 361 12/22/2021   IRONPCTSAT 16 12/22/2021   Lab Results  Component Value Date   RETICCTPCT 1.5 04/29/2021   RBC 4.28 04/23/2022   No results found for: "KPAFRELGTCHN", "LAMBDASER", "KAPLAMBRATIO" No results found for: "IGGSERUM", "IGA", "IGMSERUM" No results found for: "TOTALPROTELP", "ALBUMINELP", "A1GS", "A2GS", "BETS", "BETA2SER", "GAMS", "MSPIKE", "SPEI"   Chemistry      Component  Value Date/Time   NA 141 12/22/2021 1019   NA 138 10/28/2021 1402   K 4.5 12/22/2021 1019   CL 107 12/22/2021 1019   CO2 26 12/22/2021 1019   BUN 21 12/22/2021 1019   BUN 25 10/28/2021 1402   CREATININE 1.09 (H) 12/22/2021 1019      Component Value Date/Time   CALCIUM 10.2 12/22/2021 1019   ALKPHOS 122 (H) 04/08/2022 1037   AST 91 (H) 04/08/2022 1037   AST 187 (HH) 12/22/2021 1019   ALT 38 (H) 04/08/2022 1037   ALT 84 (H) 12/22/2021 1019   BILITOT 0.6 04/08/2022 1037   BILITOT 0.8 12/22/2021 1019       Impression and Plan: Melissa Clements is a very pleasant 76 yo caucasian female with recent diagnosis of hemochromatosis, heterozygous for the C282Y mutation.  Iron studies pending. We will set her up for phlebotomy if needed. So far her counts have remained stable.  Follow-up in 4 months.   Lottie Dawson, NP 7/21/202310:32 AM

## 2022-04-26 ENCOUNTER — Other Ambulatory Visit: Payer: Self-pay | Admitting: Cardiology

## 2022-04-29 ENCOUNTER — Other Ambulatory Visit: Payer: Self-pay

## 2022-04-29 DIAGNOSIS — E785 Hyperlipidemia, unspecified: Secondary | ICD-10-CM | POA: Diagnosis not present

## 2022-04-29 DIAGNOSIS — R748 Abnormal levels of other serum enzymes: Secondary | ICD-10-CM

## 2022-04-30 ENCOUNTER — Telehealth: Payer: Self-pay

## 2022-04-30 DIAGNOSIS — E785 Hyperlipidemia, unspecified: Secondary | ICD-10-CM

## 2022-04-30 LAB — LIPID PANEL
Chol/HDL Ratio: 2.7 ratio (ref 0.0–4.4)
Cholesterol, Total: 178 mg/dL (ref 100–199)
HDL: 66 mg/dL (ref >39)
LDL Chol Calc (NIH): 98 mg/dL (ref 0–99)
Triglycerides: 78 mg/dL (ref 0–149)
VLDL Cholesterol Cal: 14 mg/dL (ref 5–40)

## 2022-04-30 LAB — AST: AST: 143 IU/L — ABNORMAL HIGH (ref 0–40)

## 2022-04-30 LAB — ALT: ALT: 63 IU/L — ABNORMAL HIGH (ref 0–32)

## 2022-04-30 NOTE — Telephone Encounter (Signed)
-----   Message from Park Liter, MD sent at 04/30/2022 10:22 AM EDT ----- Cholesterol slightly worse, liver function tests minimally higher, continue present management.  I would recommend to do liver function test in about 3 weeks

## 2022-04-30 NOTE — Telephone Encounter (Signed)
Patient notified of results and aware of coming in for labs. Orders on file.

## 2022-05-21 ENCOUNTER — Other Ambulatory Visit: Payer: Self-pay

## 2022-05-21 ENCOUNTER — Ambulatory Visit: Payer: Medicare Other | Admitting: Cardiology

## 2022-05-21 ENCOUNTER — Encounter: Payer: Self-pay | Admitting: Cardiology

## 2022-05-21 VITALS — BP 146/70 | HR 55 | Ht 64.0 in | Wt 208.0 lb

## 2022-05-21 DIAGNOSIS — E782 Mixed hyperlipidemia: Secondary | ICD-10-CM | POA: Diagnosis not present

## 2022-05-21 DIAGNOSIS — I10 Essential (primary) hypertension: Secondary | ICD-10-CM | POA: Diagnosis not present

## 2022-05-21 DIAGNOSIS — Z9861 Coronary angioplasty status: Secondary | ICD-10-CM

## 2022-05-21 DIAGNOSIS — I2510 Atherosclerotic heart disease of native coronary artery without angina pectoris: Secondary | ICD-10-CM | POA: Diagnosis not present

## 2022-05-21 DIAGNOSIS — I255 Ischemic cardiomyopathy: Secondary | ICD-10-CM

## 2022-05-21 DIAGNOSIS — E785 Hyperlipidemia, unspecified: Secondary | ICD-10-CM

## 2022-05-21 NOTE — Progress Notes (Signed)
Cardiology Office Note:    Date:  05/21/2022   ID:  Melissa Clements, DOB [birth year], MRN [id number]  PCP:  Melissa Caraway, MD  Cardiologist:  Melissa Campus, MD    Referring MD: Melissa Caraway, MD   No chief complaint on file. Doing fine  History of Present Illness:    Melissa Clements is a 76 y.o. female with past medical history significant for coronary disease, status post PCI and stenting of the left anterior descending artery in face of late presentation of myocardial infarction in 2021, cardiomyopathy ischemic with normalization of her ejection fraction, paroxysmal atrial fibrillation on Xarelto, abnormality of the liver function test which is chronic and only mild however we did have some difficulty with statins. She is in my office today doing well.  Denies have any chest pain tightness squeezing pressure burning chest no palpitations dizziness swelling of lower extremities she did have swelling of lower extremities before but that improved significantly  Past Medical History:  Diagnosis Date   Arthritis    end stage right hip   Basal cell carcinoma    Benign essential HTN 01/23/2015   CAD S/P percutaneous coronary angioplasty 11/06/2019   LAD PCI with DES 11/02/2019   Chronic kidney disease    stage III, related to high blood pressure medication   Colon polyp    CRI (chronic renal insufficiency), stage 3 (moderate) (HCC) 11/06/2019   No ACE or ARB   Depression    Dizziness 01/23/2015   Dyslipidemia, goal LDL below 70 11/06/2019   LDL 84- changed from Zocor to Lipitor 80 mg   Dyspnea on exertion 11/01/2019   Elevated liver enzymes 02/03/2021   Hematoma 11/06/2019   Hematoma and ecchymosis Rt radial site post PCI-(anticoagulation for new AF held).    History of chest pain    History of dizziness    History of palpitations    Hyperlipidemia    Hypertension    Ischemic cardiomyopathy 11/06/2019   EF 45%   NSTEMI (non-ST elevated myocardial infarction) ([place]) 11/01/2019   S/P NSTEMI  11/03/2019   Obese 01/25/2018   Palpitations 01/23/2015   Paroxysmal atrial fibrillation ([place]) 08/25/2020   Persistent atrial fibrillation ([place])    New diagnosis 11/03/2019- not anticoagulated yet- minimal symptoms- rate controlled   Precordial chest pain 01/23/2015   Negative stress test in the spring 2019   S/P right THA, AA 01/24/2018   Sinus bradycardia 01/20/2018    Past Surgical History:  Procedure Laterality Date   CARDIAC CATHETERIZATION     CARDIOVERSION N/A 01/29/2020   Procedure: CARDIOVERSION;  Surgeon: [name], MD;  Location: [place];  Service: Cardiovascular;  Laterality: N/A;   CATARACT EXTRACTION, BILATERAL     COLONOSCOPY     CORONARY STENT INTERVENTION N/A 11/02/2019   Procedure: CORONARY STENT INTERVENTION;  Surgeon: Leonie Man, MD;  Location: [place] [facility];  Service: Cardiovascular;  Laterality: N/A;   DIAGNOSTIC LAPAROSCOPY     DILATION AND CURETTAGE OF UTERUS     LEFT HEART CATH AND CORONARY ANGIOGRAPHY N/A 11/02/2019   Procedure: LEFT HEART CATH AND CORONARY ANGIOGRAPHY;  Surgeon: Leonie Man, MD;  Location: [facility];  Service: Cardiovascular;  Laterality: N/A;   TOTAL HIP ARTHROPLASTY Right 01/24/2018   Procedure: RIGHT TOTAL HIP ARTHROPLASTY ANTERIOR APPROACH;  Surgeon: Paralee Cancel, MD;  Location: [facility];  Service: Orthopedics;  Laterality: Right;  70 mins    Current Medications: Current Meds  Medication Sig   atorvastatin (LIPITOR)  20 MG tablet Take 1 tablet (20 mg total) by mouth daily.   Cholecalciferol (VITAMIN D) 2000 units tablet Take 2,000 Units by mouth every other day.    Cyanocobalamin (B-12) 1000 MCG SUBL Place 1 tablet under the tongue daily.   docusate sodium (COLACE) 100 MG capsule Take 100 mg by mouth daily.   furosemide (LASIX) 20 MG tablet Take 1 tablet (20 mg total) by mouth daily. (Patient taking differently: Take 20 mg by mouth as needed for fluid.)   lisinopril (ZESTRIL) 2.5 MG tablet TAKE ONE TABLET BY  MOUTH ONE TIME DAILY   metoprolol succinate (TOPROL-XL) 50 MG 24 hr tablet Take 0.5 tablets (25 mg total) by mouth daily. Hold if heart rate is <60 bpm   nitroGLYCERIN (NITROSTAT) 0.4 MG SL tablet Place 1 tablet (0.4 mg total) under the tongue every 5 (five) minutes x 3 doses as needed for chest pain.   rivaroxaban (XARELTO) 20 MG TABS tablet Take 1 tablet (20 mg total) by mouth daily with supper.     Allergies:   Pollen extract   Social History   Socioeconomic History   Marital status: Married    Spouse name: Melissa Clements   Number of children: 1   Years of education: college   Highest education level: Associate degree: academic program  Occupational History   Occupation: retired  Tobacco Use   Smoking status: Never   Smokeless tobacco: Never  Scientific laboratory technician Use: Never used  Substance and Sexual Activity   Alcohol use: No    Alcohol/week: 0.0 standard drinks of alcohol   Drug use: No   Sexual activity: Not on file  Other Topics Concern   Not on file  Social History Narrative   Not on file   Social Determinants of Health   Financial Resource Strain: Not on file  Food Insecurity: Not on file  Transportation Needs: Not on file  Physical Activity: Not on file  Stress: Not on file  Social Connections: Not on file     Family History: The patient's family history includes Alzheimer's disease in her brother and mother; Diabetes in her mother; Heart attack (age of onset: 80) in her father; Heart disease in her father. ROS:   Please see the history of present illness.    All 14 point review of systems negative except as described per history of present illness  EKGs/Labs/Other Studies Reviewed:      Recent Labs: 10/21/2021: NT-Pro BNP 141 04/23/2022: BUN 19; Creatinine 1.08; Hemoglobin 12.8; Platelet Count 106; Potassium 4.5; Sodium 139 04/29/2022: ALT 63  Recent Lipid Panel    Component Value Date/Time   CHOL 178 04/29/2022 1023   TRIG 78 04/29/2022 1023   HDL 66  04/29/2022 1023   CHOLHDL 2.7 04/29/2022 1023   CHOLHDL 2.6 11/02/2019 0344   VLDL 11 11/02/2019 0344   LDLCALC 98 04/29/2022 1023    Physical Exam:    VS:  BP (!) 146/70 (BP Location: Left Arm, Patient Position: Sitting)   Pulse (!) 55   Ht '5\' 4"'$  (1.626 m)   Wt 208 lb 0.6 oz (94.4 kg)   SpO2 97%   BMI 35.71 kg/m     Wt Readings from Last 3 Encounters:  05/21/22 208 lb 0.6 oz (94.4 kg)  04/23/22 217 lb (98.4 kg)  12/25/21 220 lb (99.8 kg)     GEN:  Well nourished, well developed in no acute distress HEENT: Normal NECK: No JVD; No carotid bruits LYMPHATICS: No lymphadenopathy CARDIAC:  RRR, no murmurs, no rubs, no gallops RESPIRATORY:  Clear to auscultation without rales, wheezing or rhonchi  ABDOMEN: Soft, non-tender, non-distended MUSCULOSKELETAL:  No edema; No deformity  SKIN: Warm and dry LOWER EXTREMITIES: no swelling NEUROLOGIC:  Alert and oriented x 3 PSYCHIATRIC:  Normal affect   ASSESSMENT:    1. CAD S/P percutaneous coronary angioplasty   2. Ischemic cardiomyopathy   3. Primary hypertension   4. Mixed hyperlipidemia    PLAN:    In order of problems listed above:  Coronary disease stable not on antiplatelet therapy since there were no acute coronary event within the last year and she is on anticoagulation we will continue monitoring. History of ischemic cardiomyopathy she is on small dose of lisinopril, also small dose of beta-blocker because of bradycardia her ejection fraction however normalized no signs and symptoms of decompensation on the physical exam Essential hypertension she does have some component of whitecoat hypertension blood pressure elevated today in the office however she tells me that at home her blood pressure will never exceed numbers 123/82.  We will continue present management Mixed dyslipidemia she is on Lipitor 20.  Her AST and ALT is minimally elevated.  She does have lab work test pending to have her liver function test rechecked did  review K PN which show me her LDL of 98 which is not desirable number I would like to see it less than 70.  If her liver function test normal or stable I will add Zetia in the future to her medical regimen   Medication Adjustments/Labs and Tests Ordered: Current medicines are reviewed at length with the patient today.  Concerns regarding medicines are outlined above.  No orders of the defined types were placed in this encounter.  Medication changes: No orders of the defined types were placed in this encounter.   Signed, Park Liter, MD, [facility] 05/21/2022 2:06 PM    [facility]

## 2022-05-21 NOTE — Patient Instructions (Signed)

## 2022-05-22 LAB — AST: AST: 117 IU/L — ABNORMAL HIGH (ref 0–40)

## 2022-05-22 LAB — ALT: ALT: 55 IU/L — ABNORMAL HIGH (ref 0–32)

## 2022-05-26 ENCOUNTER — Telehealth: Payer: Self-pay

## 2022-05-26 NOTE — Telephone Encounter (Signed)
Results reviewed with pt as per Dr. Krasowski's note.  Pt verbalized understanding and had no additional questions. Routed to PCP  

## 2022-07-08 DIAGNOSIS — N183 Chronic kidney disease, stage 3 unspecified: Secondary | ICD-10-CM | POA: Diagnosis not present

## 2022-07-08 DIAGNOSIS — I10 Essential (primary) hypertension: Secondary | ICD-10-CM | POA: Diagnosis not present

## 2022-07-08 DIAGNOSIS — E782 Mixed hyperlipidemia: Secondary | ICD-10-CM | POA: Diagnosis not present

## 2022-07-08 DIAGNOSIS — I480 Paroxysmal atrial fibrillation: Secondary | ICD-10-CM | POA: Diagnosis not present

## 2022-07-08 DIAGNOSIS — I2510 Atherosclerotic heart disease of native coronary artery without angina pectoris: Secondary | ICD-10-CM | POA: Diagnosis not present

## 2022-07-16 ENCOUNTER — Other Ambulatory Visit: Payer: Self-pay | Admitting: Cardiology

## 2022-07-22 DIAGNOSIS — H04123 Dry eye syndrome of bilateral lacrimal glands: Secondary | ICD-10-CM | POA: Diagnosis not present

## 2022-07-22 DIAGNOSIS — H40013 Open angle with borderline findings, low risk, bilateral: Secondary | ICD-10-CM | POA: Diagnosis not present

## 2022-07-22 DIAGNOSIS — Z961 Presence of intraocular lens: Secondary | ICD-10-CM | POA: Diagnosis not present

## 2022-07-22 DIAGNOSIS — H43812 Vitreous degeneration, left eye: Secondary | ICD-10-CM | POA: Diagnosis not present

## 2022-07-23 ENCOUNTER — Inpatient Hospital Stay: Payer: Medicare Other | Attending: Hematology & Oncology

## 2022-07-23 ENCOUNTER — Encounter: Payer: Self-pay | Admitting: Family

## 2022-07-23 ENCOUNTER — Inpatient Hospital Stay: Payer: Medicare Other | Admitting: Family

## 2022-07-23 DIAGNOSIS — Z79899 Other long term (current) drug therapy: Secondary | ICD-10-CM | POA: Insufficient documentation

## 2022-07-23 LAB — CBC WITH DIFFERENTIAL (CANCER CENTER ONLY)
Abs Immature Granulocytes: 0.01 10*3/uL (ref 0.00–0.07)
Basophils Absolute: 0.1 10*3/uL (ref 0.0–0.1)
Basophils Relative: 1 %
Eosinophils Absolute: 0.4 10*3/uL (ref 0.0–0.5)
Eosinophils Relative: 9 %
HCT: 42.8 % (ref 36.0–46.0)
Hemoglobin: 13.9 g/dL (ref 12.0–15.0)
Immature Granulocytes: 0 %
Lymphocytes Relative: 45 %
Lymphs Abs: 1.9 10*3/uL (ref 0.7–4.0)
MCH: 30.4 pg (ref 26.0–34.0)
MCHC: 32.5 g/dL (ref 30.0–36.0)
MCV: 93.7 fL (ref 80.0–100.0)
Monocytes Absolute: 0.5 10*3/uL (ref 0.1–1.0)
Monocytes Relative: 12 %
Neutro Abs: 1.4 10*3/uL — ABNORMAL LOW (ref 1.7–7.7)
Neutrophils Relative %: 33 %
Platelet Count: 120 10*3/uL — ABNORMAL LOW (ref 150–400)
RBC: 4.57 MIL/uL (ref 3.87–5.11)
RDW: 15.6 % — ABNORMAL HIGH (ref 11.5–15.5)
WBC Count: 4.3 10*3/uL (ref 4.0–10.5)
nRBC: 0 % (ref 0.0–0.2)

## 2022-07-23 LAB — CMP (CANCER CENTER ONLY)
ALT: 33 U/L (ref 0–44)
AST: 69 U/L — ABNORMAL HIGH (ref 15–41)
Albumin: 3.7 g/dL (ref 3.5–5.0)
Alkaline Phosphatase: 99 U/L (ref 38–126)
Anion gap: 7 (ref 5–15)
BUN: 28 mg/dL — ABNORMAL HIGH (ref 8–23)
CO2: 26 mmol/L (ref 22–32)
Calcium: 9.8 mg/dL (ref 8.9–10.3)
Chloride: 106 mmol/L (ref 98–111)
Creatinine: 1.26 mg/dL — ABNORMAL HIGH (ref 0.44–1.00)
GFR, Estimated: 44 mL/min — ABNORMAL LOW (ref >60)
Glucose, Bld: 99 mg/dL (ref 70–99)
Potassium: 4.3 mmol/L (ref 3.5–5.1)
Sodium: 139 mmol/L (ref 135–145)
Total Bilirubin: 0.8 mg/dL (ref 0.3–1.2)
Total Protein: 7.2 g/dL (ref 6.5–8.1)

## 2022-07-23 LAB — IRON AND IRON BINDING CAPACITY (CC-WL,HP ONLY)
Iron: 87 ug/dL (ref 28–170)
Saturation Ratios: 21 % (ref 10.4–31.8)
TIBC: 413 ug/dL (ref 250–450)
UIBC: 326 ug/dL (ref 148–442)

## 2022-07-23 LAB — FERRITIN: Ferritin: 25 ng/mL (ref 11–307)

## 2022-07-23 NOTE — Progress Notes (Signed)
Hematology and Oncology Follow Up Visit  Melissa Clements [id number] 03-09-1946 76 y.o. 07/23/2022   Principle Diagnosis:  Hemochromatosis, heterozygous for the C282Y mutation   Current Therapy:        Phlebotomy to maintain ferritin < 100 and iron saturation < 50%   Interim History:  Melissa Clements is here today for follow-up. She is doing quite well and has no complaints at this time.  No c/o fatigue at this time.  No fever, chills, n/v, cough, rash, dizziness, SOB, chest pain, palpitations, abdominal pain or changes in bowel or bladder habits.  No blood loss, bruising or petechiae.  No swelling, tenderness, numbness or tingling in her extremities at this time.  No falls or syncope.  Appetite and hydration are good. Weight is stable at 205 lbs.   ECOG Performance Status: 0 - Asymptomatic  Medications:  Allergies as of 07/23/2022       Reactions   Pollen Extract         Medication List        Accurate as of July 23, 2022 10:48 AM. If you have any questions, ask your nurse or doctor.          atorvastatin 20 MG tablet Commonly known as: LIPITOR Take 1 tablet (20 mg total) by mouth daily.   B-12 1000 MCG Subl Place 1 tablet under the tongue daily.   docusate sodium 100 MG capsule Commonly known as: COLACE Take 100 mg by mouth daily.   furosemide 20 MG tablet Commonly known as: LASIX Take 1 tablet (20 mg total) by mouth daily. What changed:  when to take this reasons to take this   lisinopril 2.5 MG tablet Commonly known as: ZESTRIL TAKE ONE TABLET BY MOUTH ONE TIME DAILY   metoprolol succinate 50 MG 24 hr tablet Commonly known as: TOPROL-XL Take 0.5 tablets (25 mg total) by mouth daily.   nitroGLYCERIN 0.4 MG SL tablet Commonly known as: NITROSTAT Place 1 tablet (0.4 mg total) under the tongue every 5 (five) minutes x 3 doses as needed for chest pain.   rivaroxaban 20 MG Tabs tablet Commonly known as: XARELTO Take 1 tablet (20 mg total) by mouth  daily with supper.   Vitamin D 50 MCG (2000 UT) tablet Take 2,000 Units by mouth every other day.        Allergies:  Allergies  Allergen Reactions   Pollen Extract     Past Medical History, Surgical history, Social history, and Family History were reviewed and updated.  Review of Systems: All other 10 point review of systems is negative.   Physical Exam:  weight is 205 lb 1.9 oz (93 kg). Her oral temperature is 97.7 F (36.5 C). Her blood pressure is 140/55 (abnormal) and her pulse is 58 (abnormal). Her respiration is 17 and oxygen saturation is 98%.   Wt Readings from Last 3 Encounters:  07/23/22 205 lb 1.9 oz (93 kg)  05/21/22 208 lb 0.6 oz (94.4 kg)  04/23/22 217 lb (98.4 kg)    Ocular: Sclerae unicteric, pupils equal, round and reactive to light Ear-nose-throat: Oropharynx clear, dentition fair Lymphatic: No cervical or supraclavicular adenopathy Lungs no rales or rhonchi, good excursion bilaterally Heart regular rate and rhythm, no murmur appreciated Abd soft, nontender, positive bowel sounds MSK no focal spinal tenderness, no joint edema Neuro: non-focal, well-oriented, appropriate affect Breasts: Deferred   Lab Results  Component Value Date   WBC 4.3 07/23/2022   HGB 13.9 07/23/2022   HCT 42.8 07/23/2022  MCV 93.7 07/23/2022   PLT 120 (L) 07/23/2022   Lab Results  Component Value Date   FERRITIN 19 04/23/2022   IRON 57 04/23/2022   TIBC 371 04/23/2022   UIBC 314 04/23/2022   IRONPCTSAT 15 04/23/2022   Lab Results  Component Value Date   RETICCTPCT 1.5 04/29/2021   RBC 4.57 07/23/2022   No results found for: "KPAFRELGTCHN", "LAMBDASER", "KAPLAMBRATIO" No results found for: "IGGSERUM", "IGA", "IGMSERUM" No results found for: "TOTALPROTELP", "ALBUMINELP", "A1GS", "A2GS", "BETS", "BETA2SER", "GAMS", "MSPIKE", "SPEI"   Chemistry      Component Value Date/Time   NA 139 04/23/2022 0956   NA 138 10/28/2021 1402   K 4.5 04/23/2022 0956   CL 107  04/23/2022 0956   CO2 26 04/23/2022 0956   BUN 19 04/23/2022 0956   BUN 25 10/28/2021 1402   CREATININE 1.08 (H) 04/23/2022 0956      Component Value Date/Time   CALCIUM 9.5 04/23/2022 0956   ALKPHOS 99 04/23/2022 0956   AST 117 (H) 05/21/2022 1503   AST 128 (H) 04/23/2022 0956   ALT 55 (H) 05/21/2022 1503   ALT 54 (H) 04/23/2022 0956   BILITOT 0.7 04/23/2022 0956       Impression and Plan: Melissa Clements is a very pleasant 76 yo caucasian female with recent diagnosis of hemochromatosis, heterozygous for the C282Y mutation.  Iron studies pending. We will set her up for phlebotomy if needed. So far her counts have remained stable.  Follow-up in 4 months.   Melissa Dawson, NP 10/20/202310:48 AM

## 2022-08-05 ENCOUNTER — Other Ambulatory Visit (HOSPITAL_BASED_OUTPATIENT_CLINIC_OR_DEPARTMENT_OTHER): Payer: Self-pay

## 2022-08-05 DIAGNOSIS — Z1231 Encounter for screening mammogram for malignant neoplasm of breast: Secondary | ICD-10-CM

## 2022-09-13 ENCOUNTER — Ambulatory Visit (HOSPITAL_BASED_OUTPATIENT_CLINIC_OR_DEPARTMENT_OTHER)
Admission: RE | Admit: 2022-09-13 | Discharge: 2022-09-13 | Disposition: A | Discharge status: Home / Self Care | Payer: Medicare Other | Source: Ambulatory Visit | Attending: Family Medicine | Admitting: Family Medicine

## 2022-09-13 ENCOUNTER — Encounter (HOSPITAL_BASED_OUTPATIENT_CLINIC_OR_DEPARTMENT_OTHER): Payer: Self-pay

## 2022-09-13 DIAGNOSIS — Z1231 Encounter for screening mammogram for malignant neoplasm of breast: Secondary | ICD-10-CM | POA: Diagnosis not present

## 2022-10-19 ENCOUNTER — Ambulatory Visit: Payer: Medicare Other | Attending: Cardiology | Admitting: Cardiology

## 2022-10-19 ENCOUNTER — Encounter: Payer: Self-pay | Admitting: Cardiology

## 2022-10-19 VITALS — BP 142/70 | HR 64 | Ht 63.5 in | Wt 203.0 lb

## 2022-10-19 DIAGNOSIS — E785 Hyperlipidemia, unspecified: Secondary | ICD-10-CM

## 2022-10-19 DIAGNOSIS — I2510 Atherosclerotic heart disease of native coronary artery without angina pectoris: Secondary | ICD-10-CM | POA: Diagnosis not present

## 2022-10-19 DIAGNOSIS — Z9861 Coronary angioplasty status: Secondary | ICD-10-CM | POA: Diagnosis not present

## 2022-10-19 DIAGNOSIS — I480 Paroxysmal atrial fibrillation: Secondary | ICD-10-CM | POA: Diagnosis not present

## 2022-10-19 DIAGNOSIS — I10 Essential (primary) hypertension: Secondary | ICD-10-CM

## 2022-10-19 DIAGNOSIS — I255 Ischemic cardiomyopathy: Secondary | ICD-10-CM | POA: Diagnosis not present

## 2022-10-19 NOTE — Progress Notes (Signed)
Cardiology Office Note:    Date:  10/19/2022   ID:  Melissa Clements, DOB 1946-08-24, MRN [id number]  PCP:  [name], MD  Cardiologist:  Jenne Campus, MD    Referring MD: [name], MD   Chief Complaint  Patient presents with   Follow-up  Doing well  History of Present Illness:    Melissa Clements is a 77 y.o. female with past medical history significant for coronary artery disease, in 2021 she did have angioplasty of LAD secondary to myocardial infarction, however it was late presentation, she ended up having ischemic cardiomyopathy with ejection fraction 45% however recent echocardiogram showed normalization of left ventricle ejection fraction.  Additional problem include paroxysmal atrial fibrillation she is on Xarelto.  She does have abnormality of the liver function test which prevent me from increasing dose of statin.  She also recently she was diagnosed with hemochromatosis she is being follow-up by hematologist for that. She comes today to my office for follow-up.  Overall she is doing very well.  She say that she had no chest pain tightness squeezing pressure burning chest.  She have a new dog that she is walking with on the regular basis and she is very happy.  She does about 3000 steps a day  Past Medical History:  Diagnosis Date   Arthritis    end stage right hip   Basal cell carcinoma    Benign essential HTN 01/23/2015   CAD S/P percutaneous coronary angioplasty 11/06/2019   LAD PCI with DES 11/02/2019   Chronic kidney disease    stage III, related to high blood pressure medication   Colon polyp    CRI (chronic renal insufficiency), stage 3 (moderate) (HCC) 11/06/2019   No ACE or ARB   Depression    Dizziness 01/23/2015   Dyslipidemia, goal LDL below 70 11/06/2019   LDL 84- changed from Zocor to Lipitor 80 mg   Dyspnea on exertion 11/01/2019   Elevated liver enzymes 02/03/2021   Hematoma 11/06/2019   Hematoma and ecchymosis Rt radial site post PCI-(anticoagulation  for new AF held).    History of chest pain    History of dizziness    History of palpitations    Hyperlipidemia    Hypertension    Ischemic cardiomyopathy 11/06/2019   EF 45%   NSTEMI (non-ST elevated myocardial infarction) ([place]) 11/01/2019   S/P NSTEMI 11/03/2019   Obese 01/25/2018   Palpitations 01/23/2015   Paroxysmal atrial fibrillation ([place]) 08/25/2020   Persistent atrial fibrillation ([place])    New diagnosis 11/03/2019- not anticoagulated yet- minimal symptoms- rate controlled   Precordial chest pain 01/23/2015   Negative stress test in the spring 2019   S/P right THA, AA 01/24/2018   Sinus bradycardia 01/20/2018    Past Surgical History:  Procedure Laterality Date   CARDIAC CATHETERIZATION     CARDIOVERSION N/A 01/29/2020   Procedure: CARDIOVERSION;  Surgeon: [name], MD;  Location: [place];  Service: Cardiovascular;  Laterality: N/A;   CATARACT EXTRACTION, BILATERAL     COLONOSCOPY     CORONARY STENT INTERVENTION N/A 11/02/2019   Procedure: CORONARY STENT INTERVENTION;  Surgeon: Leonie Man, MD;  Location: [place] [facility];  Service: Cardiovascular;  Laterality: N/A;   DIAGNOSTIC LAPAROSCOPY     DILATION AND CURETTAGE OF UTERUS     LEFT HEART CATH AND CORONARY ANGIOGRAPHY N/A 11/02/2019   Procedure: LEFT HEART CATH AND CORONARY ANGIOGRAPHY;  Surgeon: Leonie Man, MD;  Location: [place] [facility];  Service: Cardiovascular;  Laterality: N/A;   TOTAL HIP ARTHROPLASTY Right 01/24/2018   Procedure: RIGHT TOTAL HIP ARTHROPLASTY ANTERIOR APPROACH;  Surgeon: Paralee Cancel, MD;  [facility];  Service: Orthopedics;  Laterality: Right;  70 mins    Current Medications: Current Meds  Medication Sig   atorvastatin (LIPITOR) 20 MG tablet Take 1 tablet (20 mg total) by mouth daily.   Cholecalciferol (VITAMIN D) 2000 units tablet Take 2,000 Units by mouth every other day.    Cyanocobalamin (B-12) 2500 MCG SUBL Place 1 tablet under the tongue daily.   docusate  sodium (COLACE) 100 MG capsule Take 100 mg by mouth daily.   furosemide (LASIX) 20 MG tablet Take 1 tablet (20 mg total) by mouth daily. (Patient taking differently: Take 20 mg by mouth as needed for fluid.)   lisinopril (ZESTRIL) 2.5 MG tablet TAKE ONE TABLET BY MOUTH ONE TIME DAILY   metoprolol succinate (TOPROL-XL) 50 MG 24 hr tablet Take 0.5 tablets (25 mg total) by mouth daily.   nitroGLYCERIN (NITROSTAT) 0.4 MG SL tablet Place 1 tablet (0.4 mg total) under the tongue every 5 (five) minutes x 3 doses as needed for chest pain.   rivaroxaban (XARELTO) 20 MG TABS tablet Take 1 tablet (20 mg total) by mouth daily with supper.     Allergies:   Pollen extract   Social History   Socioeconomic History   Marital status: Married    Spouse name: [name]   Number of children: 1   Years of education: college   Highest education level: Associate degree: academic program  Occupational History   Occupation: retired  Tobacco Use   Smoking status: Never   Smokeless tobacco: Never  Scientific laboratory technician Use: Never used  Substance and Sexual Activity   Alcohol use: No    Alcohol/week: 0.0 standard drinks of alcohol   Drug use: No   Sexual activity: Not on file  Other Topics Concern   Not on file  Social History Narrative   Not on file   Social Determinants of Health   Financial Resource Strain: Not on file  Food Insecurity: Not on file  Transportation Needs: Not on file  Physical Activity: Not on file  Stress: Not on file  Social Connections: Not on file     Family History: The patient's family history includes Alzheimer's disease in her brother and mother; Diabetes in her mother; Heart attack (age of onset: 65) in her father; Heart disease in her father. ROS:   Please see the history of present illness.    All 14 point review of systems negative except as described per history of present illness  EKGs/Labs/Other Studies Reviewed:      Recent Labs: 10/21/2021: NT-Pro BNP  141 07/23/2022: ALT 33; BUN 28; Creatinine 1.26; Hemoglobin 13.9; Platelet Count 120; Potassium 4.3; Sodium 139  Recent Lipid Panel    Component Value Date/Time   CHOL 178 04/29/2022 1023   TRIG 78 04/29/2022 1023   HDL 66 04/29/2022 1023   CHOLHDL 2.7 04/29/2022 1023   CHOLHDL 2.6 11/02/2019 0344   VLDL 11 11/02/2019 0344   LDLCALC 98 04/29/2022 1023    Physical Exam:    VS:  BP (!) 142/70 (BP Location: Left Arm, Patient Position: Sitting)   Pulse 64   Ht 5' 3.5" (1.613 m)   Wt 203 lb 0.6 oz (92.1 kg)   SpO2 96%   BMI 35.40 kg/m     Wt Readings from Last 3 Encounters:  10/19/22  203 lb 0.6 oz (92.1 kg)  07/23/22 205 lb 1.9 oz (93 kg)  05/21/22 208 lb 0.6 oz (94.4 kg)     GEN:  Well nourished, well developed in no acute distress HEENT: Normal NECK: No JVD; No carotid bruits LYMPHATICS: No lymphadenopathy CARDIAC: RRR, no murmurs, no rubs, no gallops RESPIRATORY:  Clear to auscultation without rales, wheezing or rhonchi  ABDOMEN: Soft, non-tender, non-distended MUSCULOSKELETAL:  No edema; No deformity  SKIN: Warm and dry LOWER EXTREMITIES: no swelling NEUROLOGIC:  Alert and oriented x 3 PSYCHIATRIC:  Normal affect   ASSESSMENT:    1. CAD S/P percutaneous coronary angioplasty   2. Benign essential HTN   3. Ischemic cardiomyopathy   4. Paroxysmal atrial fibrillation (HCC)   5. Dyslipidemia, goal LDL below 70    PLAN:    In order of problems listed above:  Coronary disease stable from that point review on appropriate guideline directed medical therapy which I will continue. History of cardiomyopathy with normalization.  She had difficulty tolerating some medications however likely his ejection fraction seems to be improving.  I will ask her to have another echocardiogram done to check on that. Dyslipidemia I do have her fasting lipid profile from summer which show me LDL 98 HDL 66.  Again we have difficulty increasing dosages of medication because of liver  issues. Hemochromatosis which probably is contributing to her liver problem. Will check her fasting lipid profile liver function test to see if and if we can augment her cholesterol medications   Medication Adjustments/Labs and Tests Ordered: Current medicines are reviewed at length with the patient today.  Concerns regarding medicines are outlined above.  No orders of the defined types were placed in this encounter.  Medication changes: No orders of the defined types were placed in this encounter.   Signed, Park Liter, MD, [facility] 10/19/2022 10:45 AM    [place]

## 2022-10-19 NOTE — Patient Instructions (Addendum)

## 2022-11-03 ENCOUNTER — Ambulatory Visit: Payer: Medicare Other | Admitting: Cardiology

## 2022-11-05 ENCOUNTER — Ambulatory Visit (HOSPITAL_BASED_OUTPATIENT_CLINIC_OR_DEPARTMENT_OTHER)
Admission: RE | Admit: 2022-11-05 | Discharge: 2022-11-05 | Disposition: A | Discharge status: Home / Self Care | Payer: Medicare Other | Source: Ambulatory Visit | Attending: Cardiology | Admitting: Cardiology

## 2022-11-05 ENCOUNTER — Other Ambulatory Visit (HOSPITAL_BASED_OUTPATIENT_CLINIC_OR_DEPARTMENT_OTHER): Payer: Medicare Other

## 2022-11-05 DIAGNOSIS — I255 Ischemic cardiomyopathy: Secondary | ICD-10-CM

## 2022-11-05 DIAGNOSIS — R600 Localized edema: Secondary | ICD-10-CM | POA: Diagnosis not present

## 2022-11-05 DIAGNOSIS — R0609 Other forms of dyspnea: Secondary | ICD-10-CM | POA: Diagnosis not present

## 2022-11-06 LAB — ECHOCARDIOGRAM COMPLETE
AR max vel: 1.5 cm2
AV Area VTI: 1.65 cm2
AV Area mean vel: 1.66 cm2
AV Mean grad: 7 mmHg
AV Peak grad: 12.5 mmHg
Ao pk vel: 1.77 m/s
Area-P 1/2: 2.36 cm2

## 2022-11-23 ENCOUNTER — Encounter: Payer: Self-pay | Admitting: Family

## 2022-11-23 ENCOUNTER — Inpatient Hospital Stay: Payer: Medicare Other | Attending: Family

## 2022-11-23 ENCOUNTER — Inpatient Hospital Stay: Payer: Medicare Other | Admitting: Family

## 2022-11-23 ENCOUNTER — Other Ambulatory Visit: Payer: Self-pay

## 2022-11-23 DIAGNOSIS — Z79899 Other long term (current) drug therapy: Secondary | ICD-10-CM | POA: Insufficient documentation

## 2022-11-23 DIAGNOSIS — R197 Diarrhea, unspecified: Secondary | ICD-10-CM | POA: Insufficient documentation

## 2022-11-23 DIAGNOSIS — R609 Edema, unspecified: Secondary | ICD-10-CM | POA: Diagnosis not present

## 2022-11-23 DIAGNOSIS — R5383 Other fatigue: Secondary | ICD-10-CM | POA: Insufficient documentation

## 2022-11-23 LAB — CBC WITH DIFFERENTIAL (CANCER CENTER ONLY)
Abs Immature Granulocytes: 0.06 10*3/uL (ref 0.00–0.07)
Basophils Absolute: 0 10*3/uL (ref 0.0–0.1)
Basophils Relative: 1 %
Eosinophils Absolute: 0.5 10*3/uL (ref 0.0–0.5)
Eosinophils Relative: 9 %
HCT: 41.8 % (ref 36.0–46.0)
Hemoglobin: 13.6 g/dL (ref 12.0–15.0)
Immature Granulocytes: 1 %
Lymphocytes Relative: 40 %
Lymphs Abs: 1.9 10*3/uL (ref 0.7–4.0)
MCH: 31 pg (ref 26.0–34.0)
MCHC: 32.5 g/dL (ref 30.0–36.0)
MCV: 95.2 fL (ref 80.0–100.0)
Monocytes Absolute: 0.7 10*3/uL (ref 0.1–1.0)
Monocytes Relative: 13 %
Neutro Abs: 1.8 10*3/uL (ref 1.7–7.7)
Neutrophils Relative %: 36 %
Platelet Count: 82 10*3/uL — ABNORMAL LOW (ref 150–400)
RBC: 4.39 MIL/uL (ref 3.87–5.11)
RDW: 15 % (ref 11.5–15.5)
WBC Count: 4.9 10*3/uL (ref 4.0–10.5)
nRBC: 0 % (ref 0.0–0.2)

## 2022-11-23 LAB — CMP (CANCER CENTER ONLY)
ALT: 46 U/L — ABNORMAL HIGH (ref 0–44)
AST: 93 U/L — ABNORMAL HIGH (ref 15–41)
Albumin: 3 g/dL — ABNORMAL LOW (ref 3.5–5.0)
Alkaline Phosphatase: 97 U/L (ref 38–126)
Anion gap: 8 (ref 5–15)
BUN: 21 mg/dL (ref 8–23)
CO2: 24 mmol/L (ref 22–32)
Calcium: 9 mg/dL (ref 8.9–10.3)
Chloride: 108 mmol/L (ref 98–111)
Creatinine: 1.08 mg/dL — ABNORMAL HIGH (ref 0.44–1.00)
GFR, Estimated: 53 mL/min — ABNORMAL LOW (ref >60)
Glucose, Bld: 103 mg/dL — ABNORMAL HIGH (ref 70–99)
Potassium: 4.7 mmol/L (ref 3.5–5.1)
Sodium: 140 mmol/L (ref 135–145)
Total Bilirubin: 1.1 mg/dL (ref 0.3–1.2)
Total Protein: 6.8 g/dL (ref 6.5–8.1)

## 2022-11-23 LAB — IRON AND IRON BINDING CAPACITY (CC-WL,HP ONLY)
Iron: 99 ug/dL (ref 28–170)
Saturation Ratios: 28 % (ref 10.4–31.8)
TIBC: 354 ug/dL (ref 250–450)
UIBC: 255 ug/dL (ref 148–442)

## 2022-11-23 LAB — FERRITIN: Ferritin: 37 ng/mL (ref 11–307)

## 2022-11-23 NOTE — Progress Notes (Signed)
Hematology and Oncology Follow Up Visit  Melissa Clements [id number] [birth year] 77 y.o. 11/23/2022   Principle Diagnosis:  Hemochromatosis, heterozygous for the C282Y mutation   Current Therapy:        Phlebotomy to maintain ferritin < 100 and iron saturation < 50%   Interim History:  Melissa Clements is here today for follow-up. She is doing well but does note some fatigue at times.  She has not noted any blood loss. No abnormal bruising, no petechiae.  No fever, chills, n/v, cough, rash, dizziness, SOB, chest pain, palpitations or changes in bladder habits.  She states that she ate something over the weekend that didn't agree with her and she had 2 episodes of diarrhea and sore stomach for a couple days. No issues since then. No abdominal pain or bloating at this time.  No swelling, tenderness, numbness or tingling in her extremities. She states that she avoids salty foods and wears compression stockings daily to reduce any fluid retention.  No falls or syncope.  Appetite and hydration are good. Weight is stable at 204 lbs.   ECOG Performance Status: 1 - Symptomatic but completely ambulatory  Medications:  Allergies as of 11/23/2022       Reactions   Pollen Extract         Medication List        Accurate as of November 23, 2022 10:46 AM. If you have any questions, ask your nurse or doctor.          atorvastatin 20 MG tablet Commonly known as: LIPITOR Take 1 tablet (20 mg total) by mouth daily.   B-12 2500 MCG Subl Place 1 tablet under the tongue daily.   docusate sodium 100 MG capsule Commonly known as: COLACE Take 100 mg by mouth daily.   furosemide 20 MG tablet Commonly known as: LASIX Take 1 tablet (20 mg total) by mouth daily. What changed:  when to take this reasons to take this   lisinopril 2.5 MG tablet Commonly known as: ZESTRIL TAKE ONE TABLET BY MOUTH ONE TIME DAILY   metoprolol succinate 50 MG 24 hr tablet Commonly known as: TOPROL-XL Take 0.5  tablets (25 mg total) by mouth daily.   nitroGLYCERIN 0.4 MG SL tablet Commonly known as: NITROSTAT Place 1 tablet (0.4 mg total) under the tongue every 5 (five) minutes x 3 doses as needed for chest pain.   rivaroxaban 20 MG Tabs tablet Commonly known as: XARELTO Take 1 tablet (20 mg total) by mouth daily with supper.   Vitamin D 50 MCG (2000 UT) tablet Take 2,000 Units by mouth every other day.        Allergies:  Allergies  Allergen Reactions   Pollen Extract     Past Medical History, Surgical history, Social history, and Family History were reviewed and updated.  Review of Systems: All other 10 point review of systems is negative.   Physical Exam:  vitals were not taken for this visit.   Wt Readings from Last 3 Encounters:  10/19/22 203 lb 0.6 oz (92.1 kg)  07/23/22 205 lb 1.9 oz (93 kg)  05/21/22 208 lb 0.6 oz (94.4 kg)    Ocular: Sclerae unicteric, pupils equal, round and reactive to light Ear-nose-throat: Oropharynx clear, dentition fair Lymphatic: No cervical or supraclavicular adenopathy Lungs no rales or rhonchi, good excursion bilaterally Heart regular rate and rhythm, no murmur appreciated Abd soft, nontender, positive bowel sounds MSK no focal spinal tenderness, no joint edema Neuro: non-focal, well-oriented, appropriate affect Breasts:  Deferred   Lab Results  Component Value Date   WBC 4.9 11/23/2022   HGB 13.6 11/23/2022   HCT 41.8 11/23/2022   MCV 95.2 11/23/2022   PLT 82 (L) 11/23/2022   Lab Results  Component Value Date   FERRITIN 25 07/23/2022   IRON 87 07/23/2022   TIBC 413 07/23/2022   UIBC 326 07/23/2022   IRONPCTSAT 21 07/23/2022   Lab Results  Component Value Date   RETICCTPCT 1.5 04/29/2021   RBC 4.39 11/23/2022   No results found for: "KPAFRELGTCHN", "LAMBDASER", "KAPLAMBRATIO" No results found for: "IGGSERUM", "IGA", "IGMSERUM" No results found for: "TOTALPROTELP", "ALBUMINELP", "A1GS", "A2GS", "BETS", "BETA2SER", "GAMS",  "MSPIKE", "SPEI"   Chemistry      Component Value Date/Time   NA 139 07/23/2022 1025   NA 138 10/28/2021 1402   K 4.3 07/23/2022 1025   CL 106 07/23/2022 1025   CO2 26 07/23/2022 1025   BUN 28 (H) 07/23/2022 1025   BUN 25 10/28/2021 1402   CREATININE 1.26 (H) 07/23/2022 1025      Component Value Date/Time   CALCIUM 9.8 07/23/2022 1025   ALKPHOS 99 07/23/2022 1025   AST 69 (H) 07/23/2022 1025   ALT 33 07/23/2022 1025   BILITOT 0.8 07/23/2022 1025       Impression and Plan: Melissa Clements is a very pleasant 77 yo caucasian female with recent diagnosis of hemochromatosis, heterozygous for the C282Y mutation.  Iron studies pending. We will set her up for phlebotomy if needed.  Follow-up in 6 months.   Lottie Dawson, NP 2/20/202410:46 AM

## 2022-11-29 ENCOUNTER — Telehealth: Payer: Self-pay

## 2022-11-29 DIAGNOSIS — Z79899 Other long term (current) drug therapy: Secondary | ICD-10-CM

## 2022-11-29 NOTE — Telephone Encounter (Signed)
-----   Message from Park Liter, MD sent at 11/15/2022  9:31 AM EST ----- All yes, I remember now so lets keep the same dose of statin and probably in about 3 weeks lets recheck her liver function test ----- Message ----- From: Darrel Reach, CMA Sent: 11/10/2022   2:16 PM EST To: Park Liter, MD  Patient notified of results. She asked if you want to increase her Lipitor that was decrease after GI doctor mention elevated liver enzymes? Please advise  ----- Message ----- From: Park Liter, MD Sent: 11/09/2022   5:48 PM EST To: Tyler Pita, RN  Echocardiogram showed normal left ventricle ejection fraction, moderately dilated left atrium overall looks good

## 2022-11-29 NOTE — Telephone Encounter (Signed)
Patient notified of the following per Dr. Raliegh Ip, agreed with plan. Lab order on file. Advise to avoid lunch hours.

## 2022-12-01 ENCOUNTER — Telehealth: Payer: Self-pay

## 2022-12-01 NOTE — Telephone Encounter (Signed)
Pt viewed results in My Chart. Routed to PCP

## 2022-12-13 ENCOUNTER — Other Ambulatory Visit: Payer: Self-pay | Admitting: Cardiology

## 2022-12-13 NOTE — Telephone Encounter (Signed)
Prescription refill request for Xarelto received.  Indication: AF Last office visit: 10/19/22  Nelta Numbers MD Weight: 92.1kg Age: 77 Scr: 1.08 on 11/23/22 CrCl: 63.43  Based on above findings Xarelto '20mg'$  daily is the appropriate dose.  Refill approved.

## 2023-01-03 DIAGNOSIS — I10 Essential (primary) hypertension: Secondary | ICD-10-CM | POA: Diagnosis not present

## 2023-01-03 DIAGNOSIS — E559 Vitamin D deficiency, unspecified: Secondary | ICD-10-CM | POA: Diagnosis not present

## 2023-01-10 DIAGNOSIS — N1831 Chronic kidney disease, stage 3a: Secondary | ICD-10-CM | POA: Diagnosis not present

## 2023-01-10 DIAGNOSIS — E782 Mixed hyperlipidemia: Secondary | ICD-10-CM | POA: Diagnosis not present

## 2023-01-10 DIAGNOSIS — I480 Paroxysmal atrial fibrillation: Secondary | ICD-10-CM | POA: Diagnosis not present

## 2023-01-10 DIAGNOSIS — Z23 Encounter for immunization: Secondary | ICD-10-CM | POA: Diagnosis not present

## 2023-01-10 DIAGNOSIS — I255 Ischemic cardiomyopathy: Secondary | ICD-10-CM | POA: Diagnosis not present

## 2023-01-10 DIAGNOSIS — D6869 Other thrombophilia: Secondary | ICD-10-CM | POA: Diagnosis not present

## 2023-01-10 DIAGNOSIS — I2510 Atherosclerotic heart disease of native coronary artery without angina pectoris: Secondary | ICD-10-CM | POA: Diagnosis not present

## 2023-01-10 DIAGNOSIS — Z0000 Encounter for general adult medical examination without abnormal findings: Secondary | ICD-10-CM | POA: Diagnosis not present

## 2023-01-10 DIAGNOSIS — R7989 Other specified abnormal findings of blood chemistry: Secondary | ICD-10-CM | POA: Diagnosis not present

## 2023-01-13 ENCOUNTER — Other Ambulatory Visit (HOSPITAL_BASED_OUTPATIENT_CLINIC_OR_DEPARTMENT_OTHER): Payer: Self-pay | Admitting: Family Medicine

## 2023-01-13 DIAGNOSIS — M85859 Other specified disorders of bone density and structure, unspecified thigh: Secondary | ICD-10-CM

## 2023-01-21 ENCOUNTER — Other Ambulatory Visit: Payer: Self-pay | Admitting: Cardiology

## 2023-02-08 ENCOUNTER — Encounter (HOSPITAL_BASED_OUTPATIENT_CLINIC_OR_DEPARTMENT_OTHER): Payer: Self-pay

## 2023-02-08 ENCOUNTER — Ambulatory Visit (HOSPITAL_BASED_OUTPATIENT_CLINIC_OR_DEPARTMENT_OTHER)
Admission: RE | Admit: 2023-02-08 | Discharge: 2023-02-08 | Disposition: A | Discharge status: Home / Self Care | Payer: Medicare Other | Source: Ambulatory Visit | Attending: Family Medicine | Admitting: Family Medicine

## 2023-02-08 DIAGNOSIS — M85859 Other specified disorders of bone density and structure, unspecified thigh: Secondary | ICD-10-CM

## 2023-02-09 ENCOUNTER — Telehealth (HOSPITAL_BASED_OUTPATIENT_CLINIC_OR_DEPARTMENT_OTHER): Payer: Self-pay

## 2023-02-22 ENCOUNTER — Other Ambulatory Visit: Payer: Self-pay | Admitting: Cardiology

## 2023-04-05 ENCOUNTER — Other Ambulatory Visit: Payer: Self-pay | Admitting: Cardiology

## 2023-04-21 DIAGNOSIS — D6869 Other thrombophilia: Secondary | ICD-10-CM | POA: Diagnosis not present

## 2023-04-21 DIAGNOSIS — I10 Essential (primary) hypertension: Secondary | ICD-10-CM | POA: Diagnosis not present

## 2023-04-21 DIAGNOSIS — I480 Paroxysmal atrial fibrillation: Secondary | ICD-10-CM | POA: Diagnosis not present

## 2023-04-21 DIAGNOSIS — Z79899 Other long term (current) drug therapy: Secondary | ICD-10-CM | POA: Diagnosis not present

## 2023-04-21 DIAGNOSIS — E559 Vitamin D deficiency, unspecified: Secondary | ICD-10-CM | POA: Diagnosis not present

## 2023-04-21 DIAGNOSIS — E782 Mixed hyperlipidemia: Secondary | ICD-10-CM | POA: Diagnosis not present

## 2023-04-21 DIAGNOSIS — L821 Other seborrheic keratosis: Secondary | ICD-10-CM | POA: Diagnosis not present

## 2023-05-06 ENCOUNTER — Encounter: Payer: Self-pay | Admitting: Cardiology

## 2023-05-10 ENCOUNTER — Encounter: Payer: Self-pay | Admitting: Cardiology

## 2023-05-10 ENCOUNTER — Ambulatory Visit: Payer: Medicare Other | Attending: Cardiology | Admitting: Cardiology

## 2023-05-10 VITALS — BP 120/62 | HR 58 | Ht 63.5 in | Wt 197.0 lb

## 2023-05-10 DIAGNOSIS — I10 Essential (primary) hypertension: Secondary | ICD-10-CM

## 2023-05-10 DIAGNOSIS — Z9861 Coronary angioplasty status: Secondary | ICD-10-CM

## 2023-05-10 DIAGNOSIS — I480 Paroxysmal atrial fibrillation: Secondary | ICD-10-CM | POA: Diagnosis not present

## 2023-05-10 DIAGNOSIS — Z79899 Other long term (current) drug therapy: Secondary | ICD-10-CM

## 2023-05-10 DIAGNOSIS — I255 Ischemic cardiomyopathy: Secondary | ICD-10-CM

## 2023-05-10 DIAGNOSIS — I2510 Atherosclerotic heart disease of native coronary artery without angina pectoris: Secondary | ICD-10-CM

## 2023-05-10 MED ORDER — NITROGLYCERIN 0.4 MG SL SUBL: 0.4000 mg | SUBLINGUAL_TABLET | SUBLINGUAL | 3 refills | Status: AC | PRN

## 2023-05-10 NOTE — Progress Notes (Signed)
Cardiology Office Note:    Date:  05/10/2023   ID:  Melissa Clements, DOB October 01, 1946, MRN [id number]  PCP:  Melissa Dimitri, MD  Cardiologist:  Melissa Balsam, MD    Referring MD: Melissa Dimitri, MD   Chief Complaint  Patient presents with   Medication Management    Refilll Nitroglycerin   lab results    History of Present Illness:    Melissa Clements is a 77 y.o. female  with past medical history significant for coronary artery disease, in 2021 she did have angioplasty of LAD secondary to myocardial infarction, however it was late presentation, she ended up having ischemic cardiomyopathy with ejection fraction 45% however recent echocardiogram showed normalization of left ventricle ejection fraction. Additional problem include paroxysmal atrial fibrillation she is on Xarelto. She does have abnormality of the liver function test which prevent me from increasing dose of statin. She also recently she was diagnosed with hemochromatosis she is being follow-up by hematologist for that.  Comes today to my office for follow-up.  Overall she is she is doing very well.  Denies have any chest pain tightness squeezing pressure mentions described mostly very short lasting for about 10 seconds sensation of the chest that happened at rest at the same time she is able to vacuum the floor with no difficulty walking and no problem.  Past Medical History:  Diagnosis Date   Arthritis    end stage right hip   Basal cell carcinoma    Benign essential HTN 01/23/2015   CAD S/P percutaneous coronary angioplasty 11/06/2019   LAD PCI with DES 11/02/2019   Chronic kidney disease    stage III, related to high blood pressure medication   Colon polyp    CRI (chronic renal insufficiency), stage 3 (moderate) (HCC) 11/06/2019   No ACE or ARB   Depression    Dizziness 01/23/2015   Dyslipidemia, goal LDL below 70 11/06/2019   LDL 84- changed from Zocor to Lipitor 80 mg   Dyspnea on exertion 11/01/2019   Elevated liver  enzymes 02/03/2021   Hematoma 11/06/2019   Hematoma and ecchymosis Rt radial site post PCI-(anticoagulation for new AF held).    History of chest pain    History of dizziness    History of palpitations    Hyperlipidemia    Hypertension    Ischemic cardiomyopathy 11/06/2019   EF 45%   NSTEMI (non-ST elevated myocardial infarction) (HCC) 11/01/2019   S/P NSTEMI 11/03/2019   Obese 01/25/2018   Palpitations 01/23/2015   Paroxysmal atrial fibrillation (HCC) 08/25/2020   Persistent atrial fibrillation (HCC)    New diagnosis 11/03/2019- not anticoagulated yet- minimal symptoms- rate controlled   Precordial chest pain 01/23/2015   Negative stress test in the spring 2019   S/P right THA, AA 01/24/2018   Sinus bradycardia 01/20/2018    Past Surgical History:  Procedure Laterality Date   CARDIAC CATHETERIZATION     CARDIOVERSION N/A 01/29/2020   Procedure: CARDIOVERSION;  Surgeon: Chilton Si, MD;  [facility] ENDOSCOPY;  Service: Cardiovascular;  Laterality: N/A;   CATARACT EXTRACTION, BILATERAL     COLONOSCOPY     CORONARY STENT INTERVENTION N/A 11/02/2019   Procedure: CORONARY STENT INTERVENTION;  Surgeon: Marykay Lex, MD;  Location: [facility] INVASIVE CV LAB;  Service: Cardiovascular;  Laterality: N/A;   DIAGNOSTIC LAPAROSCOPY     DILATION AND CURETTAGE OF UTERUS     LEFT HEART CATH AND CORONARY ANGIOGRAPHY N/A 11/02/2019   Procedure: LEFT HEART CATH AND CORONARY ANGIOGRAPHY;  Surgeon: Marykay Lex, MD;  Location[facility] INVASIVE CV LAB;  Service: Cardiovascular;  Laterality: N/A;   TOTAL HIP ARTHROPLASTY Right 01/24/2018   Procedure: RIGHT TOTAL HIP ARTHROPLASTY ANTERIOR APPROACH;  Surgeon: Durene Romans, MD;  Location: [facility] ORS;  Service: Orthopedics;  Laterality: Right;  70 mins    Current Medications: Current Meds  Medication Sig   atorvastatin (LIPITOR) 20 MG tablet Take 1 tablet (20 mg total) by mouth daily.   Cholecalciferol (VITAMIN D) 2000 units tablet Take 2,000 Units by mouth every  other day.    Cyanocobalamin (B-12) 2500 MCG SUBL Place 1 tablet under the tongue daily.   docusate sodium (COLACE) 100 MG capsule Take 100 mg by mouth daily.   lisinopril (ZESTRIL) 2.5 MG tablet TAKE ONE TABLET BY MOUTH ONE TIME DAILY   metoprolol succinate (TOPROL-XL) 50 MG 24 hr tablet TAKE HALF TABLET BY MOUTH DAILY   nitroGLYCERIN (NITROSTAT) 0.4 MG SL tablet Place 1 tablet (0.4 mg total) under the tongue every 5 (five) minutes x 3 doses as needed for chest pain.   rivaroxaban (XARELTO) 20 MG TABS tablet TAKE ONE TABLET BY MOUTH ONE TIME DAILY with supper (Patient taking differently: Take 20 mg by mouth daily with supper.)     Allergies:   Pollen extract   Social History   Socioeconomic History   Marital status: Married    Spouse name: Customer service manager   Number of children: 1   Years of education: college   Highest education level: Associate degree: academic program  Occupational History   Occupation: retired  Tobacco Use   Smoking status: Never   Smokeless tobacco: Never  Vaping Use   Vaping status: Never Used  Substance and Sexual Activity   Alcohol use: No    Alcohol/week: 0.0 standard drinks of alcohol   Drug use: No   Sexual activity: Not on file  Other Topics Concern   Not on file  Social History Narrative   Not on file   Social Determinants of Health   Financial Resource Strain: Not on file  Food Insecurity: Not on file  Transportation Needs: Not on file  Physical Activity: Not on file  Stress: Not on file  Social Connections: Not on file     Family History: The patient's family history includes Alzheimer's disease in her brother and mother; Diabetes in her mother; Heart attack (age of onset: 67) in her father; Heart disease in her father. ROS:   Please see the history of present illness.    All 14 point review of systems negative except as described per history of present illness  EKGs/Labs/Other Studies Reviewed:         Recent Labs: 11/23/2022: ALT 46; BUN  21; Creatinine 1.08; Hemoglobin 13.6; Platelet Count 82; Potassium 4.7; Sodium 140  Recent Lipid Panel    Component Value Date/Time   CHOL 178 04/29/2022 1023   TRIG 78 04/29/2022 1023   HDL 66 04/29/2022 1023   CHOLHDL 2.7 04/29/2022 1023   CHOLHDL 2.6 11/02/2019 0344   VLDL 11 11/02/2019 0344   LDLCALC 98 04/29/2022 1023    Physical Exam:    VS:  BP 120/62 (BP Location: Left Arm, Patient Position: Sitting)   Pulse (!) 58   Ht 5' 3.5" (1.613 m)   Wt 197 lb (89.4 kg)   SpO2 96%   BMI 34.35 kg/m     Wt Readings from Last 3 Encounters:  05/10/23 197 lb (89.4 kg)  11/23/22 204 lb 1.9 oz (92.6 kg)  10/19/22 203 lb 0.6 oz (92.1 kg)     GEN:  Well nourished, well developed in no acute distress HEENT: Normal NECK: No JVD; No carotid bruits LYMPHATICS: No lymphadenopathy CARDIAC: RRR, no murmurs, no rubs, no gallops RESPIRATORY:  Clear to auscultation without rales, wheezing or rhonchi  ABDOMEN: Soft, non-tender, non-distended MUSCULOSKELETAL:  No edema; No deformity  SKIN: Warm and dry LOWER EXTREMITIES: no swelling NEUROLOGIC:  Alert and oriented x 3 PSYCHIATRIC:  Normal affect   ASSESSMENT:    1. Medication management   2. Ischemic cardiomyopathy   3. CAD S/P percutaneous coronary angioplasty   4. Benign essential HTN   5. Paroxysmal atrial fibrillation (HCC)    PLAN:    In order of problems listed above:  Coronary artery disease stable from that point to an appropriate medications which I will continue. Benign essential hypertension blood pressure well-controlled. Dyslipidemia I did review K PN from 04/21/2023 with LDL 75 HDL 54.  She always got a mild elevation of liver function test but that seems to be stable. Ischemic cardiomyopathy, last echocardiogram showed normal left ventricle ejection fraction   Medication Adjustments/Labs and Tests Ordered: Current medicines are reviewed at length with the patient today.  Concerns regarding medicines are outlined  above.  Orders Placed This Encounter  Procedures   EKG 12-Lead   Medication changes: No orders of the defined types were placed in this encounter.   Signed, Georgeanna Lea, MD, [facility] 05/10/2023 3:44 PM    [facility]

## 2023-05-10 NOTE — Patient Instructions (Signed)

## 2023-05-24 ENCOUNTER — Other Ambulatory Visit: Payer: Self-pay

## 2023-05-24 ENCOUNTER — Inpatient Hospital Stay: Payer: Medicare Other | Admitting: Medical Oncology

## 2023-05-24 ENCOUNTER — Inpatient Hospital Stay: Payer: Medicare Other | Attending: Family

## 2023-05-24 ENCOUNTER — Encounter: Payer: Self-pay | Admitting: Medical Oncology

## 2023-05-24 DIAGNOSIS — Z79899 Other long term (current) drug therapy: Secondary | ICD-10-CM | POA: Diagnosis not present

## 2023-05-24 DIAGNOSIS — R5383 Other fatigue: Secondary | ICD-10-CM | POA: Diagnosis not present

## 2023-05-24 DIAGNOSIS — D696 Thrombocytopenia, unspecified: Secondary | ICD-10-CM

## 2023-05-24 LAB — IRON AND IRON BINDING CAPACITY (CC-WL,HP ONLY)
Iron: 176 ug/dL — ABNORMAL HIGH (ref 28–170)
Saturation Ratios: 51 % — ABNORMAL HIGH (ref 10.4–31.8)
TIBC: 346 ug/dL (ref 250–450)
UIBC: 170 ug/dL (ref 148–442)

## 2023-05-24 LAB — CMP (CANCER CENTER ONLY)
ALT: 27 U/L (ref 0–44)
AST: 54 U/L — ABNORMAL HIGH (ref 15–41)
Albumin: 3.6 g/dL (ref 3.5–5.0)
Alkaline Phosphatase: 79 U/L (ref 38–126)
Anion gap: 6 (ref 5–15)
BUN: 26 mg/dL — ABNORMAL HIGH (ref 8–23)
CO2: 29 mmol/L (ref 22–32)
Calcium: 9.6 mg/dL (ref 8.9–10.3)
Chloride: 106 mmol/L (ref 98–111)
Creatinine: 1.09 mg/dL — ABNORMAL HIGH (ref 0.44–1.00)
GFR, Estimated: 52 mL/min — ABNORMAL LOW (ref >60)
Glucose, Bld: 110 mg/dL — ABNORMAL HIGH (ref 70–99)
Potassium: 4.5 mmol/L (ref 3.5–5.1)
Sodium: 141 mmol/L (ref 135–145)
Total Bilirubin: 1.1 mg/dL (ref 0.3–1.2)
Total Protein: 7.3 g/dL (ref 6.5–8.1)

## 2023-05-24 LAB — CBC WITH DIFFERENTIAL (CANCER CENTER ONLY)
Abs Immature Granulocytes: 0 10*3/uL (ref 0.00–0.07)
Basophils Absolute: 0.1 10*3/uL (ref 0.0–0.1)
Basophils Relative: 1 %
Eosinophils Absolute: 0.5 10*3/uL (ref 0.0–0.5)
Eosinophils Relative: 11 %
HCT: 44.3 % (ref 36.0–46.0)
Hemoglobin: 14.4 g/dL (ref 12.0–15.0)
Immature Granulocytes: 0 %
Lymphocytes Relative: 42 %
Lymphs Abs: 1.9 10*3/uL (ref 0.7–4.0)
MCH: 31.6 pg (ref 26.0–34.0)
MCHC: 32.5 g/dL (ref 30.0–36.0)
MCV: 97.4 fL (ref 80.0–100.0)
Monocytes Absolute: 0.5 10*3/uL (ref 0.1–1.0)
Monocytes Relative: 11 %
Neutro Abs: 1.6 10*3/uL — ABNORMAL LOW (ref 1.7–7.7)
Neutrophils Relative %: 35 %
Platelet Count: 93 10*3/uL — ABNORMAL LOW (ref 150–400)
RBC: 4.55 MIL/uL (ref 3.87–5.11)
RDW: 14.7 % (ref 11.5–15.5)
WBC Count: 4.6 10*3/uL (ref 4.0–10.5)
nRBC: 0 % (ref 0.0–0.2)

## 2023-05-24 LAB — FERRITIN: Ferritin: 40 ng/mL (ref 11–307)

## 2023-05-24 NOTE — Progress Notes (Signed)
Hematology and Oncology Follow Up Visit  Melissa Clements [id number] 08/30/1946 77 y.o. 05/24/2023   Principle Diagnosis:  Hemochromatosis, heterozygous for the C282Y mutation Thrombocytopenia   Current Therapy:        Phlebotomy to maintain ferritin < 100 and iron saturation < 50%   Interim History:  Melissa Clements is here today for follow-up. She is doing well but does note some fatigue at times.  At her last follow up her platelet level was 82. This was lower than it had been previously. Normal range for her has been in the 110-120 range. She does have a history of liver dysfunction secondary to her hemochromatosis.  She has not noted any blood loss. No abnormal bruising, no petechiae.  No fever, chills, n/v, cough, rash, dizziness, SOB, chest pain, palpitations or changes in bladder habits.  No abdominal pain or bloating at this time.  No swelling, tenderness, numbness or tingling in her extremities. She states that she avoids salty foods  No falls or syncope.  Appetite and hydration are good.  Wt Readings from Last 3 Encounters:  05/24/23 198 lb 0.6 oz (89.8 kg)  05/10/23 197 lb (89.4 kg)  11/23/22 204 lb 1.9 oz (92.6 kg)    ECOG Performance Status: 1 - Symptomatic but completely ambulatory  Medications:  Allergies as of 05/24/2023       Reactions   Pollen Extract Other (See Comments)        Medication List        Accurate as of May 24, 2023 10:54 AM. If you have any questions, ask your nurse or doctor.          atorvastatin 20 MG tablet Commonly known as: LIPITOR Take 1 tablet (20 mg total) by mouth daily.   B-12 2500 MCG Subl Place 1 tablet under the tongue daily.   docusate sodium 100 MG capsule Commonly known as: COLACE Take 100 mg by mouth daily.   lisinopril 2.5 MG tablet Commonly known as: ZESTRIL TAKE ONE TABLET BY MOUTH ONE TIME DAILY   metoprolol succinate 50 MG 24 hr tablet Commonly known as: TOPROL-XL TAKE HALF TABLET BY MOUTH DAILY    nitroGLYCERIN 0.4 MG SL tablet Commonly known as: NITROSTAT Place 1 tablet (0.4 mg total) under the tongue every 5 (five) minutes x 3 doses as needed for chest pain.   Vitamin D 50 MCG (2000 UT) tablet Take 2,000 Units by mouth every other day.   Xarelto 20 MG Tabs tablet Generic drug: rivaroxaban TAKE ONE TABLET BY MOUTH ONE TIME DAILY with supper What changed: See the new instructions.        Allergies:  Allergies  Allergen Reactions   Pollen Extract Other (See Comments)    Past Medical History, Surgical history, Social history, and Family History were reviewed and updated.  Review of Systems: All other 10 point review of systems is negative.   Physical Exam:  height is 5\' 3"  (1.6 m) and weight is 198 lb 0.6 oz (89.8 kg). Her oral temperature is 97.8 F (36.6 C). Her blood pressure is 122/63 and her pulse is 58 (abnormal). Her respiration is 18 and oxygen saturation is 100%.   Wt Readings from Last 3 Encounters:  05/24/23 198 lb 0.6 oz (89.8 kg)  05/10/23 197 lb (89.4 kg)  11/23/22 204 lb 1.9 oz (92.6 kg)    Ocular: Sclerae unicteric, pupils equal, round and reactive to light Ear-nose-throat: Oropharynx clear, dentition fair Lymphatic: No cervical or supraclavicular adenopathy Lungs no rales  or rhonchi, good excursion bilaterally Heart regular rate and rhythm, no murmur appreciated Abd soft, nontender, positive bowel sounds. No palpable hepatosplenomegaly  MSK no focal spinal tenderness, no joint edema Neuro: non-focal, well-oriented, appropriate affect  Lab Results  Component Value Date   WBC 4.6 05/24/2023   HGB 14.4 05/24/2023   HCT 44.3 05/24/2023   MCV 97.4 05/24/2023   PLT 93 (L) 05/24/2023   Lab Results  Component Value Date   FERRITIN 37 11/23/2022   IRON 99 11/23/2022   TIBC 354 11/23/2022   UIBC 255 11/23/2022   IRONPCTSAT 28 11/23/2022   Lab Results  Component Value Date   RETICCTPCT 1.5 04/29/2021   RBC 4.55 05/24/2023   No results  found for: "KPAFRELGTCHN", "LAMBDASER", "KAPLAMBRATIO" No results found for: "IGGSERUM", "IGA", "IGMSERUM" No results found for: "TOTALPROTELP", "ALBUMINELP", "A1GS", "A2GS", "BETS", "BETA2SER", "GAMS", "MSPIKE", "SPEI"   Chemistry      Component Value Date/Time   NA 140 11/23/2022 1033   NA 138 10/28/2021 1402   K 4.7 11/23/2022 1033   CL 108 11/23/2022 1033   CO2 24 11/23/2022 1033   BUN 21 11/23/2022 1033   BUN 25 10/28/2021 1402   CREATININE 1.08 (H) 11/23/2022 1033      Component Value Date/Time   CALCIUM 9.0 11/23/2022 1033   ALKPHOS 97 11/23/2022 1033   AST 93 (H) 11/23/2022 1033   ALT 46 (H) 11/23/2022 1033   BILITOT 1.1 11/23/2022 1033       Impression and Plan: Melissa Clements is a very pleasant 77 yo caucasian female with recent diagnosis of hemochromatosis, heterozygous for the C282Y mutation. She also has thrombocytopenia.   I reviewed potential etiologies for thrombocytopenia including liver disease, splenomegaly, infectious processes, nutritional anemias, immune mediated and bone marrow disorders. Patient is not taking any medications or recent infections. Patient will proceed with labs today to check CBC, CMP, iron. Will plan to recheck labs in 1 month with Vitamin B12, MMA, folate, LDH, Hepatitis B and C serologies, HIV serology, platelet by citrate and save smear.   #Thrombocytopenia, etiology unknown: --Labs today to check CBC w/diff, CMP, iron. Next visit will plan on B12 level, folate level, Hep B and C serologies, HIV serology. --Rule out pseudothrombocytopenia with platelet by citrate if needed based on follow up labs --Evaluate for platelet abnormality with save smear. --Evaluate for liver disease and/or splenomegaly with abdominal US   Disposition RTC 1 month APP, labs Abdominal US order placed and needs to be scheduled   Melissa Chestnut, PA-C 8/20/202410:54 AM

## 2023-05-25 ENCOUNTER — Encounter: Payer: Self-pay | Admitting: Medical Oncology

## 2023-05-26 ENCOUNTER — Ambulatory Visit (HOSPITAL_BASED_OUTPATIENT_CLINIC_OR_DEPARTMENT_OTHER)
Admission: RE | Admit: 2023-05-26 | Discharge: 2023-05-26 | Disposition: A | Discharge status: Home / Self Care | Payer: Medicare Other | Source: Ambulatory Visit | Attending: Medical Oncology | Admitting: Medical Oncology

## 2023-05-26 DIAGNOSIS — D696 Thrombocytopenia, unspecified: Secondary | ICD-10-CM

## 2023-05-26 DIAGNOSIS — R748 Abnormal levels of other serum enzymes: Secondary | ICD-10-CM | POA: Diagnosis not present

## 2023-05-26 DIAGNOSIS — K7689 Other specified diseases of liver: Secondary | ICD-10-CM | POA: Diagnosis not present

## 2023-06-07 ENCOUNTER — Other Ambulatory Visit: Payer: Self-pay | Admitting: Cardiology

## 2023-06-07 DIAGNOSIS — I480 Paroxysmal atrial fibrillation: Secondary | ICD-10-CM

## 2023-06-07 NOTE — Telephone Encounter (Signed)
Prescription refill request for Xarelto received.  Indication: a fib Last office visit: 05/10/23 Weight: 198 Age:77 Scr: 1.09 05/24/23 epic CrCl: 61 ml/min

## 2023-06-23 ENCOUNTER — Inpatient Hospital Stay: Payer: Medicare Other | Admitting: Medical Oncology

## 2023-06-23 ENCOUNTER — Inpatient Hospital Stay: Payer: Medicare Other | Attending: Family

## 2023-06-23 ENCOUNTER — Other Ambulatory Visit: Payer: Self-pay

## 2023-06-23 ENCOUNTER — Encounter: Payer: Self-pay | Admitting: Medical Oncology

## 2023-06-23 DIAGNOSIS — Z79899 Other long term (current) drug therapy: Secondary | ICD-10-CM | POA: Insufficient documentation

## 2023-06-23 DIAGNOSIS — D696 Thrombocytopenia, unspecified: Secondary | ICD-10-CM | POA: Diagnosis not present

## 2023-06-23 LAB — CMP (CANCER CENTER ONLY)
ALT: 27 U/L (ref 0–44)
AST: 50 U/L — ABNORMAL HIGH (ref 15–41)
Albumin: 3.6 g/dL (ref 3.5–5.0)
Alkaline Phosphatase: 85 U/L (ref 38–126)
Anion gap: 6 (ref 5–15)
BUN: 27 mg/dL — ABNORMAL HIGH (ref 8–23)
CO2: 26 mmol/L (ref 22–32)
Calcium: 9.6 mg/dL (ref 8.9–10.3)
Chloride: 106 mmol/L (ref 98–111)
Creatinine: 0.97 mg/dL (ref 0.44–1.00)
GFR, Estimated: >60 mL/min (ref >60)
Glucose, Bld: 96 mg/dL (ref 70–99)
Potassium: 4 mmol/L (ref 3.5–5.1)
Sodium: 138 mmol/L (ref 135–145)
Total Bilirubin: 0.9 mg/dL (ref 0.3–1.2)
Total Protein: 6.8 g/dL (ref 6.5–8.1)

## 2023-06-23 LAB — SAVE SMEAR(SSMR), FOR PROVIDER SLIDE REVIEW

## 2023-06-23 LAB — CBC WITH DIFFERENTIAL (CANCER CENTER ONLY)
Abs Immature Granulocytes: 0.01 10*3/uL (ref 0.00–0.07)
Basophils Absolute: 0 10*3/uL (ref 0.0–0.1)
Basophils Relative: 1 %
Eosinophils Absolute: 0.4 10*3/uL (ref 0.0–0.5)
Eosinophils Relative: 7 %
HCT: 40.7 % (ref 36.0–46.0)
Hemoglobin: 13.3 g/dL (ref 12.0–15.0)
Immature Granulocytes: 0 %
Lymphocytes Relative: 29 %
Lymphs Abs: 1.4 10*3/uL (ref 0.7–4.0)
MCH: 31.4 pg (ref 26.0–34.0)
MCHC: 32.7 g/dL (ref 30.0–36.0)
MCV: 96.2 fL (ref 80.0–100.0)
Monocytes Absolute: 0.6 10*3/uL (ref 0.1–1.0)
Monocytes Relative: 12 %
Neutro Abs: 2.4 10*3/uL (ref 1.7–7.7)
Neutrophils Relative %: 51 %
Platelet Count: 85 10*3/uL — ABNORMAL LOW (ref 150–400)
RBC: 4.23 MIL/uL (ref 3.87–5.11)
RDW: 14.6 % (ref 11.5–15.5)
WBC Count: 4.8 10*3/uL (ref 4.0–10.5)
nRBC: 0 % (ref 0.0–0.2)

## 2023-06-23 LAB — FOLATE: Folate: 27 ng/mL (ref >5.9)

## 2023-06-23 LAB — HEPATITIS C ANTIBODY: HCV Ab: NONREACTIVE

## 2023-06-23 LAB — HEPATITIS B CORE ANTIBODY, TOTAL: Hep B Core Total Ab: NONREACTIVE

## 2023-06-23 LAB — HEPATITIS B SURFACE ANTIGEN: Hepatitis B Surface Ag: NONREACTIVE

## 2023-06-23 LAB — HIV ANTIBODY (ROUTINE TESTING W REFLEX): HIV Screen 4th Generation wRfx: NONREACTIVE

## 2023-06-23 LAB — VITAMIN B12: Vitamin B-12: 2947 pg/mL — ABNORMAL HIGH (ref 180–914)

## 2023-06-23 NOTE — Progress Notes (Signed)
Hematology and Oncology Follow Up Visit  Melissa Clements [id number] Apr 06, 1946 77 y.o. 06/23/2023   Principle Diagnosis:  Hemochromatosis, heterozygous for the C282Y mutation Thrombocytopenia   Current Therapy:        Phlebotomy to maintain ferritin < 100 and iron saturation < 50%   Interim History:  Melissa Clements is here today for follow-up.   She reports that she is doing well.  At her last follow up her platelet level was 82. This was lower than it had been previously. Normal range for her has been in the 110-120 range. She does have a history of liver dysfunction secondary to her hemochromatosis. After her last visit we obtained an abdominal US which was unremarkable.  She has not noted any blood loss. No abnormal bruising, no petechiae.  No fever, chills, n/v, cough, rash, dizziness, SOB, chest pain, palpitations or changes in bladder habits.  No abdominal pain or bloating at this time.  No swelling, tenderness, numbness or tingling in her extremities. She states that she avoids salty foods  No falls or syncope.  Appetite and hydration are good.  Wt Readings from Last 3 Encounters:  06/23/23 200 lb 1.9 oz (90.8 kg)  05/24/23 198 lb 0.6 oz (89.8 kg)  05/10/23 197 lb (89.4 kg)    ECOG Performance Status: 1 - Symptomatic but completely ambulatory  Medications:  Allergies as of 06/23/2023       Reactions   Pollen Extract Other (See Comments)   Runny eyes and drippy nose        Medication List        Accurate as of June 23, 2023  2:07 PM. If you have any questions, ask your nurse or doctor.          atorvastatin 20 MG tablet Commonly known as: LIPITOR Take 1 tablet (20 mg total) by mouth daily.   B-12 2500 MCG Subl Place 1 tablet under the tongue daily.   docusate sodium 100 MG capsule Commonly known as: COLACE Take 100 mg by mouth daily.   lisinopril 2.5 MG tablet Commonly known as: ZESTRIL TAKE ONE TABLET BY MOUTH ONE TIME DAILY   metoprolol  succinate 50 MG 24 hr tablet Commonly known as: TOPROL-XL TAKE HALF TABLET BY MOUTH DAILY   MULTIPLE VITAMIN PO Take 1 tablet by mouth daily.   nitroGLYCERIN 0.4 MG SL tablet Commonly known as: NITROSTAT Place 1 tablet (0.4 mg total) under the tongue every 5 (five) minutes x 3 doses as needed for chest pain.   Vitamin D 50 MCG (2000 UT) tablet Take 2,000 Units by mouth every other day.   Xarelto 20 MG Tabs tablet Generic drug: rivaroxaban Take 1 tablet by mouth once daily with supper        Allergies:  Allergies  Allergen Reactions   Pollen Extract Other (See Comments)    Runny eyes and drippy nose    Past Medical History, Surgical history, Social history, and Family History were reviewed and updated.  Review of Systems: All other 10 point review of systems is negative.   Physical Exam:  height is 5\' 3"  (1.6 m) and weight is 200 lb 1.9 oz (90.8 kg). Her oral temperature is 98 F (36.7 C). Her blood pressure is 146/69 (abnormal) and her pulse is 59 (abnormal). Her respiration is 18 and oxygen saturation is 99%.   Wt Readings from Last 3 Encounters:  06/23/23 200 lb 1.9 oz (90.8 kg)  05/24/23 198 lb 0.6 oz (89.8 kg)  05/10/23  197 lb (89.4 kg)    Ocular: Sclerae unicteric, pupils equal, round and reactive to light Ear-nose-throat: Oropharynx clear, dentition fair Lymphatic: No cervical or supraclavicular adenopathy Lungs no rales or rhonchi, good excursion bilaterally Heart regular rate and rhythm, no murmur appreciated Abd soft, nontender, positive bowel sounds. No palpable hepatosplenomegaly  MSK no focal spinal tenderness, no joint edema Neuro: non-focal, well-oriented, appropriate affect  Lab Results  Component Value Date   WBC 4.8 06/23/2023   HGB 13.3 06/23/2023   HCT 40.7 06/23/2023   MCV 96.2 06/23/2023   PLT 85 (L) 06/23/2023   Lab Results  Component Value Date   FERRITIN 40 05/24/2023   IRON 176 (H) 05/24/2023   TIBC 346 05/24/2023   UIBC 170  05/24/2023   IRONPCTSAT 51 (H) 05/24/2023   Lab Results  Component Value Date   RETICCTPCT 1.5 04/29/2021   RBC 4.23 06/23/2023   No results found for: "KPAFRELGTCHN", "LAMBDASER", "KAPLAMBRATIO" No results found for: "IGGSERUM", "IGA", "IGMSERUM" No results found for: "TOTALPROTELP", "ALBUMINELP", "A1GS", "A2GS", "BETS", "BETA2SER", "GAMS", "MSPIKE", "SPEI"   Chemistry      Component Value Date/Time   NA 138 06/23/2023 1133   NA 138 10/28/2021 1402   K 4.0 06/23/2023 1133   CL 106 06/23/2023 1133   CO2 26 06/23/2023 1133   BUN 27 (H) 06/23/2023 1133   BUN 25 10/28/2021 1402   CREATININE 0.97 06/23/2023 1133      Component Value Date/Time   CALCIUM 9.6 06/23/2023 1133   ALKPHOS 85 06/23/2023 1133   AST 50 (H) 06/23/2023 1133   ALT 27 06/23/2023 1133   BILITOT 0.9 06/23/2023 1133       Impression and Plan: Ms. Broskey is a very pleasant 77 yo caucasian female with diagnosis of hemochromatosis, heterozygous for the C282Y mutation. She also has thrombocytopenia.   Her HGB is stable at 13.3. Iron studies pending.  Her Platelets have lowered again- now at 85. Additional labs drawn today and are pending.   Patient mentions that she does not wish to return to our office. She wishes for care to be transferred to her PCP. She reports not being concerned about her labs and does not feel the need to return despite advisement and education.   Disposition Pt declines follow up  Rushie Chestnut, PA-C 9/19/20242:07 PM

## 2023-06-27 LAB — METHYLMALONIC ACID, SERUM: Methylmalonic Acid, Quantitative: 238 nmol/L (ref 0–378)

## 2023-06-30 ENCOUNTER — Other Ambulatory Visit: Payer: Self-pay | Admitting: Cardiology

## 2023-07-14 DIAGNOSIS — I480 Paroxysmal atrial fibrillation: Secondary | ICD-10-CM | POA: Diagnosis not present

## 2023-07-14 DIAGNOSIS — R198 Other specified symptoms and signs involving the digestive system and abdomen: Secondary | ICD-10-CM | POA: Diagnosis not present

## 2023-07-14 DIAGNOSIS — D696 Thrombocytopenia, unspecified: Secondary | ICD-10-CM | POA: Diagnosis not present

## 2023-07-14 DIAGNOSIS — Z9861 Coronary angioplasty status: Secondary | ICD-10-CM | POA: Diagnosis not present

## 2023-07-14 DIAGNOSIS — I10 Essential (primary) hypertension: Secondary | ICD-10-CM | POA: Diagnosis not present

## 2023-07-14 DIAGNOSIS — I2510 Atherosclerotic heart disease of native coronary artery without angina pectoris: Secondary | ICD-10-CM | POA: Diagnosis not present

## 2023-07-14 DIAGNOSIS — E782 Mixed hyperlipidemia: Secondary | ICD-10-CM | POA: Diagnosis not present

## 2023-07-14 DIAGNOSIS — I255 Ischemic cardiomyopathy: Secondary | ICD-10-CM | POA: Diagnosis not present

## 2023-07-14 DIAGNOSIS — E559 Vitamin D deficiency, unspecified: Secondary | ICD-10-CM | POA: Diagnosis not present

## 2023-07-28 DIAGNOSIS — Z961 Presence of intraocular lens: Secondary | ICD-10-CM | POA: Diagnosis not present

## 2023-07-28 DIAGNOSIS — H04123 Dry eye syndrome of bilateral lacrimal glands: Secondary | ICD-10-CM | POA: Diagnosis not present

## 2023-07-28 DIAGNOSIS — H40013 Open angle with borderline findings, low risk, bilateral: Secondary | ICD-10-CM | POA: Diagnosis not present

## 2023-07-28 DIAGNOSIS — H43812 Vitreous degeneration, left eye: Secondary | ICD-10-CM | POA: Diagnosis not present

## 2023-08-15 ENCOUNTER — Other Ambulatory Visit: Payer: Self-pay | Admitting: *Deleted

## 2023-08-15 NOTE — Progress Notes (Signed)
Lab orders entered

## 2023-08-18 ENCOUNTER — Other Ambulatory Visit: Payer: Self-pay | Admitting: Cardiology

## 2023-08-22 ENCOUNTER — Inpatient Hospital Stay: Payer: Medicare Other | Attending: Family | Admitting: Oncology

## 2023-08-22 ENCOUNTER — Inpatient Hospital Stay: Payer: Medicare Other

## 2023-08-22 ENCOUNTER — Other Ambulatory Visit: Payer: Self-pay | Admitting: *Deleted

## 2023-08-22 VITALS — BP 135/62 | HR 61 | Temp 97.8°F | Resp 18 | Ht 63.0 in | Wt 203.3 lb

## 2023-08-22 DIAGNOSIS — D696 Thrombocytopenia, unspecified: Secondary | ICD-10-CM

## 2023-08-22 DIAGNOSIS — I255 Ischemic cardiomyopathy: Secondary | ICD-10-CM | POA: Diagnosis not present

## 2023-08-22 DIAGNOSIS — Z860100 Personal history of colon polyps, unspecified: Secondary | ICD-10-CM | POA: Insufficient documentation

## 2023-08-22 DIAGNOSIS — R7989 Other specified abnormal findings of blood chemistry: Secondary | ICD-10-CM | POA: Diagnosis not present

## 2023-08-22 DIAGNOSIS — I252 Old myocardial infarction: Secondary | ICD-10-CM | POA: Insufficient documentation

## 2023-08-22 DIAGNOSIS — Z85828 Personal history of other malignant neoplasm of skin: Secondary | ICD-10-CM | POA: Insufficient documentation

## 2023-08-22 DIAGNOSIS — E785 Hyperlipidemia, unspecified: Secondary | ICD-10-CM | POA: Diagnosis not present

## 2023-08-22 DIAGNOSIS — Z7901 Long term (current) use of anticoagulants: Secondary | ICD-10-CM | POA: Diagnosis not present

## 2023-08-22 DIAGNOSIS — N186 End stage renal disease: Secondary | ICD-10-CM | POA: Diagnosis not present

## 2023-08-22 DIAGNOSIS — I4891 Unspecified atrial fibrillation: Secondary | ICD-10-CM | POA: Diagnosis not present

## 2023-08-22 DIAGNOSIS — I120 Hypertensive chronic kidney disease with stage 5 chronic kidney disease or end stage renal disease: Secondary | ICD-10-CM | POA: Insufficient documentation

## 2023-08-22 DIAGNOSIS — I2510 Atherosclerotic heart disease of native coronary artery without angina pectoris: Secondary | ICD-10-CM | POA: Insufficient documentation

## 2023-08-22 DIAGNOSIS — R748 Abnormal levels of other serum enzymes: Secondary | ICD-10-CM | POA: Insufficient documentation

## 2023-08-22 DIAGNOSIS — I4819 Other persistent atrial fibrillation: Secondary | ICD-10-CM | POA: Insufficient documentation

## 2023-08-22 LAB — CBC WITH DIFFERENTIAL (CANCER CENTER ONLY)
Abs Immature Granulocytes: 0.01 10*3/uL (ref 0.00–0.07)
Basophils Absolute: 0 10*3/uL (ref 0.0–0.1)
Basophils Relative: 1 %
Eosinophils Absolute: 0.6 10*3/uL — ABNORMAL HIGH (ref 0.0–0.5)
Eosinophils Relative: 13 %
HCT: 40.4 % (ref 36.0–46.0)
Hemoglobin: 13.5 g/dL (ref 12.0–15.0)
Immature Granulocytes: 0 %
Lymphocytes Relative: 39 %
Lymphs Abs: 1.9 10*3/uL (ref 0.7–4.0)
MCH: 32.1 pg (ref 26.0–34.0)
MCHC: 33.4 g/dL (ref 30.0–36.0)
MCV: 96 fL (ref 80.0–100.0)
Monocytes Absolute: 0.4 10*3/uL (ref 0.1–1.0)
Monocytes Relative: 9 %
Neutro Abs: 1.8 10*3/uL (ref 1.7–7.7)
Neutrophils Relative %: 38 %
Platelet Count: 97 10*3/uL — ABNORMAL LOW (ref 150–400)
RBC: 4.21 MIL/uL (ref 3.87–5.11)
RDW: 14.8 % (ref 11.5–15.5)
WBC Count: 4.7 10*3/uL (ref 4.0–10.5)
nRBC: 0 % (ref 0.0–0.2)

## 2023-08-22 LAB — SAVE SMEAR(SSMR), FOR PROVIDER SLIDE REVIEW

## 2023-08-22 LAB — FERRITIN: Ferritin: 46 ng/mL (ref 11–307)

## 2023-08-22 NOTE — Progress Notes (Signed)
[facility] New Patient Consult   Requesting MD: Gweneth Dimitri, Md [address] [place],  [place] [address]   Melissa Clements 77 y.o.  Jun 21, 1946    Reason for Consult: Thrombocytopenia, hemochromatosis heterozygote   HPI: Melissa Clements has been followed at the [facility] clinic after being diagnosed with an elevated ferritin level in 2022.  She has also been followed for mild thrombocytopenia.  She found to be a heterozygote for the C282Y mutation.  Ariton was elevated at 519 on 04/29/2021.  She had elevated liver enzymes on 04/20/2021.  This was felt to be secondary to Lipitor.Lipitor dose was decreased. She completed 3 phlebotomy treatments, last on 06/12/2021.  Has remained normal.  Melissa Clements has mild thrombocytopenia.  She has undergone a negative evaluation for chronic liver disease.  An abdominal ultrasound 05/26/2023 revealed a normal spleen size, normal direction of portal blood flow, no focal liver lesion, and increased liver echotexture.  A liver MRI on 03/23/2021 revealed no hepatic steatosis.  The spleen appeared normal.  Melissa Clements feels well at present.  She has no complaint.  Past Medical History:  Diagnosis Date   Arthritis    end stage right hip   Basal cell carcinoma    Benign essential HTN 01/23/2015   CAD S/P percutaneous coronary angioplasty 11/06/2019   LAD PCI with DES 11/02/2019   Chronic kidney disease    stage III, related to high blood pressure medication   Colon polyp    CRI (chronic renal insufficiency), stage 3 (moderate) (HCC) 11/06/2019   No ACE or ARB   Depression    Dizziness 01/23/2015   Dyslipidemia, goal LDL below 70 11/06/2019   LDL 84- changed from Zocor to Lipitor 80 mg   Dyspnea on exertion 11/01/2019   Elevated liver enzymes 02/03/2021   Hematoma 11/06/2019   Hematoma and ecchymosis Rt radial site post PCI-(anticoagulation for new AF held).    History of chest pain    History of dizziness    History of  palpitations    Hyperlipidemia    Hypertension    Ischemic cardiomyopathy 11/06/2019   EF 45%   NSTEMI (non-ST elevated myocardial infarction) (HCC) 11/01/2019   S/P NSTEMI 11/03/2019   Obese 01/25/2018   Palpitations 01/23/2015   Paroxysmal atrial fibrillation (HCC) 08/25/2020   Persistent atrial fibrillation (HCC)    New diagnosis 11/03/2019- not anticoagulated yet- minimal symptoms- rate controlled   Precordial chest pain 01/23/2015   Negative stress test in the spring 2019   S/P right THA, AA 01/24/2018   Sinus bradycardia 01/20/2018    Past Surgical History:  Procedure Laterality Date   CARDIAC CATHETERIZATION     CARDIOVERSION N/A 01/29/2020   Procedure: CARDIOVERSION;  Surgeon: Chilton Si, MD;  Location: [facility] ENDOSCOPY;  Service: Cardiovascular;  Laterality: N/A;   CATARACT EXTRACTION, BILATERAL     COLONOSCOPY     CORONARY STENT INTERVENTION N/A 11/02/2019   Procedure: CORONARY STENT INTERVENTION;  Surgeon: Marykay Lex, MD;  Location: [facility] INVASIVE CV LAB;  Service: Cardiovascular;  Laterality: N/A;   DIAGNOSTIC LAPAROSCOPY     DILATION AND CURETTAGE OF UTERUS     LEFT HEART CATH AND CORONARY ANGIOGRAPHY N/A 11/02/2019   Procedure: LEFT HEART CATH AND CORONARY ANGIOGRAPHY;  Surgeon: Marykay Lex, MD;  Location: [facility] INVASIVE CV LAB;  Service: Cardiovascular;  Laterality: N/A;   TOTAL HIP ARTHROPLASTY Right 01/24/2018   Procedure: RIGHT TOTAL HIP ARTHROPLASTY ANTERIOR APPROACH;  Surgeon: Charlann Boxer,  Molli Hazard, MD;  Location: [facility] ORS;  Service: Orthopedics;  Laterality: Right;  70 mins    Medications: Reviewed  Allergies:  Allergies  Allergen Reactions   Pollen Extract Other (See Comments)    Runny eyes and drippy nose    Family history: No family history of cancer or hematologic disorder  Social History:   She is a retired Engineer, civil (consulting).  She lives with her husband and [employer].  She does not use cigarettes or alcohol.  No transfusion history.  No risk factor for HIV or  hepatitis.  ROS:   Positives include: None  A complete ROS was otherwise negative.  Physical Exam:  Blood pressure 135/62, pulse 61, temperature 97.8 F (36.6 C), temperature source Temporal, resp. rate 18, height 5\' 3"  (1.6 m), weight 203 lb 4.8 oz (92.2 kg), SpO2 97%.  HEENT: Oral cavity without visible mass, neck without mass Lungs: End inspiratory rales at the bilateral lower posterior chest and right upper posterior chest, no respiratory distress Cardiac: Regular rate and rhythm Abdomen: No hepatosplenomegaly  Vascular: No leg edema Lymph nodes: No cervical, supraclavicular, axillary, or inguinal nodes Neurologic: Alert and oriented, the motor exam appears intact in the upper and lower extremities bilaterally Skin: No rash   LAB:  CBC  Lab Results  Component Value Date   WBC 4.7 08/22/2023   HGB 13.5 08/22/2023   HCT 40.4 08/22/2023   MCV 96.0 08/22/2023   PLT 97 (L) 08/22/2023   NEUTROABS 1.8 08/22/2023    Blood smear: The red cell morphology is unremarkable.  The polychromasia is not increased.  The platelets appear mildly decreased in number.  No platelet clumps.  The white cell morphology is generally unremarkable.  The majority the white cells are mature neutrophils and lymphocytes.  There are a few mononuclear cells with cytoplasmic ejections.  No monotonous white cell population.    CMP  Lab Results  Component Value Date   NA 138 06/23/2023   K 4.0 06/23/2023   CL 106 06/23/2023   CO2 26 06/23/2023   GLUCOSE 96 06/23/2023   BUN 27 (H) 06/23/2023   CREATININE 0.97 06/23/2023   CALCIUM 9.6 06/23/2023   PROT 6.8 06/23/2023   ALBUMIN 3.6 06/23/2023   AST 50 (H) 06/23/2023   ALT 27 06/23/2023   ALKPHOS 85 06/23/2023   BILITOT 0.9 06/23/2023   GFRNONAA >60 06/23/2023   GFRAA 59 (L) 08/25/2020        Assessment/Plan:   Hemochromatosis C282Y heterozygote Elevated ferritin in July 2022, status post 3 phlebotomy treatments-last on  06/12/2021 Elevated liver enzymes in 2022, improved with dose reduction of Lipitor Thrombocytopenia-mild, chronic Atrial fibrillation-Xarelto History of coronary artery disease Ischemic cardiomyopathy Hyperlipidemia Hypertension   Disposition:   Melissa Clements is referred for evaluation of "hemochromatosis "and thrombocytopenia.  She is a heterozygote for the C282Y mutation.  I think it is unlikely she has hemochromatosis.  It is rare to develop clinical hemochromatosis in patients who are a heterozygote for the C282Y mutation.  She does not have a history of heavy alcohol use or known risk factors for liver disease.  The ferritin has remained normal since undergoing phlebotomy in 2022.  She has mild thrombocytopenia.  The thrombocytopenia could be related to undiagnosed liver disease (a liver MRI in 2022 was negative, a liver ultrasound 2022 and this year revealed increased echogenicity), ITP, or another etiology.  There is no clinical evidence of myelodysplasia or a lymphoproliferative disorder.  I recommend continuing observation.  We will check  a ferritin level and myeloma panel today.  She will return for an office and lab visit in 6 months.  She will contact us for spontaneous bleeding or bruising.  Thornton Papas, MD  08/22/2023, 11:13 AM

## 2023-08-23 LAB — KAPPA/LAMBDA LIGHT CHAINS
Kappa free light chain: 58.5 mg/L — ABNORMAL HIGH (ref 3.3–19.4)
Kappa, lambda light chain ratio: 1.54 (ref 0.26–1.65)
Lambda free light chains: 37.9 mg/L — ABNORMAL HIGH (ref 5.7–26.3)

## 2023-08-28 LAB — MULTIPLE MYELOMA PANEL, SERUM
Albumin SerPl Elph-Mcnc: 3.3 g/dL (ref 2.9–4.4)
Albumin/Glob SerPl: 1 (ref 0.7–1.7)
Alpha 1: 0.2 g/dL (ref 0.0–0.4)
Alpha2 Glob SerPl Elph-Mcnc: 0.7 g/dL (ref 0.4–1.0)
B-Globulin SerPl Elph-Mcnc: 0.9 g/dL (ref 0.7–1.3)
Gamma Glob SerPl Elph-Mcnc: 1.6 g/dL (ref 0.4–1.8)
Globulin, Total: 3.4 g/dL (ref 2.2–3.9)
IgA: 258 mg/dL (ref 64–422)
IgG (Immunoglobin G), Serum: 1725 mg/dL — ABNORMAL HIGH (ref 586–1602)
IgM (Immunoglobulin M), Srm: 110 mg/dL (ref 26–217)
Total Protein ELP: 6.7 g/dL (ref 6.0–8.5)

## 2023-08-31 ENCOUNTER — Telehealth: Payer: Self-pay

## 2023-08-31 NOTE — Telephone Encounter (Signed)
-----   Message from Thornton Papas sent at 08/30/2023  8:42 PM EST ----- Please call patient, myeloma panel shows no monoclonal protein, iron level is normal, f/u as scheduled

## 2023-08-31 NOTE — Telephone Encounter (Signed)
Patient gave verbal understanding and no further questions or concerns

## 2023-09-13 ENCOUNTER — Other Ambulatory Visit: Payer: Self-pay | Admitting: Cardiology

## 2023-09-13 DIAGNOSIS — I480 Paroxysmal atrial fibrillation: Secondary | ICD-10-CM

## 2023-09-14 NOTE — Telephone Encounter (Signed)
Prescription refill request for Xarelto received.  Indication: PAF Last office visit: 05/10/23  Kandyce Rud MD Weight: 89.4kg Age: 77 Scr: 0.97 on 06/23/23  Epic CrCl: 68.55  Based on above findings Xarelto 20mg  daily is the appropriate dose.  Refill approved.

## 2023-09-22 DIAGNOSIS — D696 Thrombocytopenia, unspecified: Secondary | ICD-10-CM | POA: Diagnosis not present

## 2023-10-27 ENCOUNTER — Other Ambulatory Visit: Payer: Self-pay | Admitting: Cardiology

## 2023-11-13 ENCOUNTER — Other Ambulatory Visit: Payer: Self-pay | Admitting: Cardiology

## 2024-01-06 DIAGNOSIS — I10 Essential (primary) hypertension: Secondary | ICD-10-CM | POA: Diagnosis not present

## 2024-01-06 DIAGNOSIS — E782 Mixed hyperlipidemia: Secondary | ICD-10-CM | POA: Diagnosis not present

## 2024-01-06 DIAGNOSIS — D696 Thrombocytopenia, unspecified: Secondary | ICD-10-CM | POA: Diagnosis not present

## 2024-01-10 DIAGNOSIS — D1801 Hemangioma of skin and subcutaneous tissue: Secondary | ICD-10-CM | POA: Insufficient documentation

## 2024-01-10 DIAGNOSIS — D2271 Melanocytic nevi of right lower limb, including hip: Secondary | ICD-10-CM | POA: Insufficient documentation

## 2024-01-10 DIAGNOSIS — D225 Melanocytic nevi of trunk: Secondary | ICD-10-CM | POA: Insufficient documentation

## 2024-01-10 DIAGNOSIS — W57XXXA Bitten or stung by nonvenomous insect and other nonvenomous arthropods, initial encounter: Secondary | ICD-10-CM | POA: Insufficient documentation

## 2024-01-10 DIAGNOSIS — L578 Other skin changes due to chronic exposure to nonionizing radiation: Secondary | ICD-10-CM | POA: Insufficient documentation

## 2024-01-10 DIAGNOSIS — L609 Nail disorder, unspecified: Secondary | ICD-10-CM | POA: Insufficient documentation

## 2024-01-10 DIAGNOSIS — D485 Neoplasm of uncertain behavior of skin: Secondary | ICD-10-CM | POA: Insufficient documentation

## 2024-01-10 DIAGNOSIS — L649 Androgenic alopecia, unspecified: Secondary | ICD-10-CM | POA: Insufficient documentation

## 2024-01-10 DIAGNOSIS — Z86007 Personal history of in-situ neoplasm of skin: Secondary | ICD-10-CM | POA: Insufficient documentation

## 2024-01-10 DIAGNOSIS — L700 Acne vulgaris: Secondary | ICD-10-CM | POA: Insufficient documentation

## 2024-01-10 DIAGNOSIS — L814 Other melanin hyperpigmentation: Secondary | ICD-10-CM | POA: Insufficient documentation

## 2024-01-10 DIAGNOSIS — I872 Venous insufficiency (chronic) (peripheral): Secondary | ICD-10-CM | POA: Insufficient documentation

## 2024-01-11 ENCOUNTER — Encounter: Payer: Self-pay | Admitting: Cardiology

## 2024-01-11 ENCOUNTER — Ambulatory Visit: Payer: Medicare Other | Attending: Cardiology | Admitting: Cardiology

## 2024-01-11 VITALS — BP 136/84 | HR 61 | Ht 63.5 in | Wt 196.0 lb

## 2024-01-11 DIAGNOSIS — I480 Paroxysmal atrial fibrillation: Secondary | ICD-10-CM

## 2024-01-11 DIAGNOSIS — I2510 Atherosclerotic heart disease of native coronary artery without angina pectoris: Secondary | ICD-10-CM | POA: Diagnosis not present

## 2024-01-11 DIAGNOSIS — R0609 Other forms of dyspnea: Secondary | ICD-10-CM | POA: Diagnosis not present

## 2024-01-11 DIAGNOSIS — I10 Essential (primary) hypertension: Secondary | ICD-10-CM | POA: Diagnosis not present

## 2024-01-11 DIAGNOSIS — Z9861 Coronary angioplasty status: Secondary | ICD-10-CM

## 2024-01-11 NOTE — Patient Instructions (Addendum)
Medication Instructions:  Your physician recommends that you continue on your current medications as directed. Please refer to the Current Medication list given to you today.  *If you need a refill on your cardiac medications before your next appointment, please call your pharmacy*   Lab Work: None Ordered If you have labs (blood work) drawn today and your tests are completely normal, you will receive your results only by: MyChart Message (if you have MyChart) OR A paper copy in the mail If you have any lab test that is abnormal or we need to change your treatment, we will call you to review the results.   Testing/Procedures: Your physician has requested that you have a lexiscan myoview. For further information please visit [email or web address]. Please follow instruction sheet, as given.  The test will take approximately 3 to 4 hours to complete; you may bring reading material.  If someone comes with you to your appointment, they will need to remain in the main lobby due to limited space in the testing area.   How to prepare for your Myocardial Perfusion Test: Do not eat or drink 3 hours prior to your test, except you may have water. Do not consume products containing caffeine (regular or decaffeinated) 12 hours prior to your test. (ex: coffee, chocolate, sodas, tea). Do bring a list of your current medications with you.  If not listed below, you may take your medications as normal. Do wear comfortable clothes (no dresses or overalls) and walking shoes, tennis shoes preferred (No heels or open toe shoes are allowed). Do NOT wear cologne, perfume, aftershave, or lotions (deodorant is allowed). If these instructions are not followed, your test will have to be rescheduled.     Follow-Up: At [facility], you and your health needs are our priority.  As part of our continuing mission to provide you with exceptional heart care, we have created designated Provider Care Teams.  These Care Teams  include your primary Cardiologist (physician) and Advanced Practice Providers (APPs -  Physician Assistants and Nurse Practitioners) who all work together to provide you with the care you need, when you need it.  We recommend signing up for the patient portal called "MyChart".  Sign up information is provided on this After Visit Summary.  MyChart is used to connect with patients for Virtual Visits (Telemedicine).  Patients are able to view lab/test results, encounter notes, upcoming appointments, etc.  Non-urgent messages can be sent to your provider as well.   To learn more about what you can do with MyChart, go to [email or web address].    Your next appointment:   6 month(s)  The format for your next appointment:   In Person  Provider:   Robert Krasowski, MD    Other Instructions NA  

## 2024-01-11 NOTE — Progress Notes (Signed)
 Cardiology Office Note:    Date:  01/11/2024   ID:  Melissa Clements, DOB [birth year], MRN [id number]  PCP:  Gweneth Dimitri, MD  Cardiologist:  Gypsy Balsam, MD    Referring MD: Gweneth Dimitri, MD   Chief Complaint  Patient presents with   Follow-up    History of Present Illness:    Melissa Clements is a 78 y.o. female past medical history significant for coronary artery disease in 2021 she did have PTCA and angioplasty of LAD secondary to myocardial infarction, however it was late presentation it left her with ejection fraction 45% but last echocardiogram showed normalization of left ventricle ejection fraction.  Additional problem include paroxysmal atrial fibrillation on Xarelto, hemochromatosis, dyslipidemia with difficulty increasing dosages of medication because of liver dysfunction. Comes today to months for follow-up majority of time she is doing well her husband get bipolar disorder when she does have he is depressing phase it is very stressful for her and sometimes in stressful situation and she got upset she will get some chest pain.  Otherwise he can walk climb stairs with no major difficulties  Past Medical History:  Diagnosis Date   Arthritis    end stage right hip   Basal cell carcinoma    Benign essential HTN 01/23/2015   CAD S/P percutaneous coronary angioplasty 11/06/2019   LAD PCI with DES 11/02/2019   Chronic kidney disease    stage III, related to high blood pressure medication   Colon polyp    CRI (chronic renal insufficiency), stage 3 (moderate) (HCC) 11/06/2019   No ACE or ARB   Depression    Dizziness 01/23/2015   Dyslipidemia, goal LDL below 70 11/06/2019   LDL 84- changed from Zocor to Lipitor 80 mg   Dyspnea on exertion 11/01/2019   Elevated liver enzymes 02/03/2021   Hematoma 11/06/2019   Hematoma and ecchymosis Rt radial site post PCI-(anticoagulation for new AF held).    History of chest pain    History of dizziness    History of palpitations     Hyperlipidemia    Hypertension    Ischemic cardiomyopathy 11/06/2019   EF 45%   NSTEMI (non-ST elevated myocardial infarction) (HCC) 11/01/2019   S/P NSTEMI 11/03/2019   Obese 01/25/2018   Palpitations 01/23/2015   Paroxysmal atrial fibrillation (HCC) 08/25/2020   Persistent atrial fibrillation (HCC)    New diagnosis 11/03/2019- not anticoagulated yet- minimal symptoms- rate controlled   Precordial chest pain 01/23/2015   Negative stress test in the spring 2019   S/P right THA, AA 01/24/2018   Sinus bradycardia 01/20/2018    Past Surgical History:  Procedure Laterality Date   CARDIAC CATHETERIZATION     CARDIOVERSION N/A 01/29/2020   Procedure: CARDIOVERSION;  Surgeon: Chilton Si, MD;  Location: [facility] ENDOSCOPY;  Service: Cardiovascular;  Laterality: N/A;   CATARACT EXTRACTION, BILATERAL     COLONOSCOPY     CORONARY STENT INTERVENTION N/A 11/02/2019   Procedure: CORONARY STENT INTERVENTION;  Surgeon: Marykay Lex, MD;  Location: [facility] INVASIVE CV LAB;  Service: Cardiovascular;  Laterality: N/A;   DIAGNOSTIC LAPAROSCOPY     DILATION AND CURETTAGE OF UTERUS     LEFT HEART CATH AND CORONARY ANGIOGRAPHY N/A 11/02/2019   Procedure: LEFT HEART CATH AND CORONARY ANGIOGRAPHY;  Surgeon: Marykay Lex, MD;  [facility] INVASIVE CV LAB;  Service: Cardiovascular;  Laterality: N/A;   TOTAL HIP ARTHROPLASTY Right 01/24/2018   Procedure: RIGHT TOTAL HIP ARTHROPLASTY ANTERIOR APPROACH;  Surgeon: Durene Romans, MD;  Location: [facility] ORS;  Service: Orthopedics;  Laterality: Right;  70 mins    Current Medications: Current Meds  Medication Sig   atorvastatin (LIPITOR) 20 MG tablet TAKE ONE TABLET BY MOUTH ONCE A DAY   Cholecalciferol (VITAMIN D) 2000 units tablet Take 2,000 Units by mouth every other day.    docusate sodium (COLACE) 100 MG capsule Take 100 mg by mouth daily.   lisinopril (ZESTRIL) 2.5 MG tablet Take 1 tablet (2.5 mg total) by mouth daily.   metoprolol succinate (TOPROL-XL) 50 MG 24 hr  tablet Take 0.5 tablets (25 mg total) by mouth daily.   nitroGLYCERIN (NITROSTAT) 0.4 MG SL tablet Place 1 tablet (0.4 mg total) under the tongue every 5 (five) minutes x 3 doses as needed for chest pain.   rivaroxaban (XARELTO) 20 MG TABS tablet TAKE ONE TABLET BY MOUTH ONCE A DAY WITH SUPPER (Patient taking differently: Take 20 mg by mouth daily with supper.)     Allergies:   Pollen extract   Social History   Socioeconomic History   Marital status: Married    Spouse name: Jonny Ruiz   Number of children: 1   Years of education: college   Highest education level: Associate degree: academic program  Occupational History   Occupation: retired  Tobacco Use   Smoking status: Never   Smokeless tobacco: Never  Vaping Use   Vaping status: Never Used  Substance and Sexual Activity   Alcohol use: No    Alcohol/week: 0.0 standard drinks of alcohol   Drug use: No   Sexual activity: Not on file  Other Topics Concern   Not on file  Social History Narrative   Not on file   Social Drivers of Health   Financial Resource Strain: Not on file  Food Insecurity: No Food Insecurity (08/22/2023)   Hunger Vital Sign    Worried About Running Out of Food in the Last Year: Never true    Ran Out of Food in the Last Year: Never true  Transportation Needs: No Transportation Needs (08/22/2023)   PRAPARE - Administrator, Civil Service (Medical): No    Lack of Transportation (Non-Medical): No  Physical Activity: Not on file  Stress: Not on file  Social Connections: Not on file     Family History: The patient's family history includes Alzheimer's disease in her brother and mother; Diabetes in her mother; Heart attack (age of onset: 56) in her father; Heart disease in her father. ROS:   Please see the history of present illness.    All 14 point review of systems negative except as described per history of present illness  EKGs/Labs/Other Studies Reviewed:         Recent  Labs: 06/23/2023: ALT 27; BUN 27; Creatinine 0.97; Potassium 4.0; Sodium 138 08/22/2023: Hemoglobin 13.5; Platelet Count 97  Recent Lipid Panel    Component Value Date/Time   CHOL 178 04/29/2022 1023   TRIG 78 04/29/2022 1023   HDL 66 04/29/2022 1023   CHOLHDL 2.7 04/29/2022 1023   CHOLHDL 2.6 11/02/2019 0344   VLDL 11 11/02/2019 0344   LDLCALC 98 04/29/2022 1023    Physical Exam:    VS:  BP 136/84 (BP Location: Right Arm, Patient Position: Sitting)   Pulse 61   Ht 5' 3.5" (1.613 m)   Wt 196 lb (88.9 kg)   SpO2 91%   BMI 34.18 kg/m     Wt Readings from Last 3 Encounters:  01/11/24 196 lb (88.9 kg)  08/22/23  203 lb 4.8 oz (92.2 kg)  06/23/23 200 lb 1.9 oz (90.8 kg)     GEN:  Well nourished, well developed in no acute distress HEENT: Normal NECK: No JVD; No carotid bruits LYMPHATICS: No lymphadenopathy CARDIAC: RRR, no murmurs, no rubs, no gallops RESPIRATORY:  Clear to auscultation without rales, wheezing or rhonchi  ABDOMEN: Soft, non-tender, non-distended MUSCULOSKELETAL:  No edema; No deformity  SKIN: Warm and dry LOWER EXTREMITIES: no swelling NEUROLOGIC:  Alert and oriented x 3 PSYCHIATRIC:  Normal affect   ASSESSMENT:    1. CAD S/P percutaneous coronary angioplasty   2. Benign essential HTN   3. Paroxysmal atrial fibrillation (HCC)   4. Hereditary hemochromatosis (HCC)    PLAN:    In order of problems listed above:  Coronary disease she does have some history of chest pain with stressful situation did have chest there is nitroglycerin we will schedule her to have a stress test make sure he not missing any new lesion that he will be creating problem in the future. Benign essential hypertension blood pressure well-controlled continue present management. Paroxysmal atrial fibrillation maintained sinus rhythm continue anticoagulation.   Medication Adjustments/Labs and Tests Ordered: Current medicines are reviewed at length with the patient today.  Concerns  regarding medicines are outlined above.  No orders of the defined types were placed in this encounter.  Medication changes: No orders of the defined types were placed in this encounter.   Signed, Georgeanna Lea, MD, [facility] 01/11/2024 1:21 PM    [facility]

## 2024-01-12 DIAGNOSIS — I10 Essential (primary) hypertension: Secondary | ICD-10-CM | POA: Diagnosis not present

## 2024-01-12 DIAGNOSIS — Z0000 Encounter for general adult medical examination without abnormal findings: Secondary | ICD-10-CM | POA: Diagnosis not present

## 2024-01-12 DIAGNOSIS — I255 Ischemic cardiomyopathy: Secondary | ICD-10-CM | POA: Diagnosis not present

## 2024-01-12 DIAGNOSIS — I2510 Atherosclerotic heart disease of native coronary artery without angina pectoris: Secondary | ICD-10-CM | POA: Diagnosis not present

## 2024-01-12 DIAGNOSIS — N1831 Chronic kidney disease, stage 3a: Secondary | ICD-10-CM | POA: Diagnosis not present

## 2024-01-12 DIAGNOSIS — E782 Mixed hyperlipidemia: Secondary | ICD-10-CM | POA: Diagnosis not present

## 2024-01-12 DIAGNOSIS — E559 Vitamin D deficiency, unspecified: Secondary | ICD-10-CM | POA: Diagnosis not present

## 2024-01-12 DIAGNOSIS — M8588 Other specified disorders of bone density and structure, other site: Secondary | ICD-10-CM | POA: Diagnosis not present

## 2024-01-12 DIAGNOSIS — I480 Paroxysmal atrial fibrillation: Secondary | ICD-10-CM | POA: Diagnosis not present

## 2024-01-16 ENCOUNTER — Encounter (HOSPITAL_COMMUNITY): Payer: Self-pay

## 2024-01-17 ENCOUNTER — Other Ambulatory Visit (HOSPITAL_BASED_OUTPATIENT_CLINIC_OR_DEPARTMENT_OTHER): Payer: Self-pay | Admitting: Family Medicine

## 2024-01-17 ENCOUNTER — Ambulatory Visit (HOSPITAL_COMMUNITY): Attending: Cardiovascular Disease

## 2024-01-17 DIAGNOSIS — M8588 Other specified disorders of bone density and structure, other site: Secondary | ICD-10-CM

## 2024-01-17 DIAGNOSIS — R0609 Other forms of dyspnea: Secondary | ICD-10-CM | POA: Diagnosis not present

## 2024-01-17 LAB — MYOCARDIAL PERFUSION IMAGING
LV dias vol: 53 mL (ref 46–106)
LV sys vol: 17 mL
Nuc Stress EF: 68 %
Peak HR: 69 {beats}/min
Rest HR: 56 {beats}/min
Rest Nuclear Isotope Dose: 10.5 mCi
SDS: 1
SRS: 0
SSS: 1
ST Depression (mm): 0 mm
Stress Nuclear Isotope Dose: 32.4 mCi
TID: 1.06

## 2024-01-17 MED ORDER — TECHNETIUM TC 99M TETROFOSMIN IV KIT
10.5000 | PACK | Freq: Once | INTRAVENOUS | Status: AC | PRN
  Administered 2024-01-17: 10.5 via INTRAVENOUS

## 2024-01-17 MED ORDER — TECHNETIUM TC 99M TETROFOSMIN IV KIT
32.4000 | PACK | Freq: Once | INTRAVENOUS | Status: AC | PRN
  Administered 2024-01-17: 32.4 via INTRAVENOUS

## 2024-01-17 MED ORDER — REGADENOSON 0.4 MG/5ML IV SOLN
0.4000 mg | Freq: Once | INTRAVENOUS | Status: AC
Start: 2024-01-17 — End: 2024-01-17
  Administered 2024-01-17: 0.4 mg via INTRAVENOUS

## 2024-01-18 ENCOUNTER — Telehealth: Payer: Self-pay

## 2024-01-18 NOTE — Telephone Encounter (Signed)
 Patient notified through my chart.

## 2024-01-18 NOTE — Telephone Encounter (Signed)
-----   Message from Ralene Burger sent at 01/18/2024 12:50 PM EDT ----- Stress test is normal

## 2024-01-23 ENCOUNTER — Other Ambulatory Visit (HOSPITAL_BASED_OUTPATIENT_CLINIC_OR_DEPARTMENT_OTHER): Payer: Self-pay | Admitting: Family Medicine

## 2024-01-23 DIAGNOSIS — Z1231 Encounter for screening mammogram for malignant neoplasm of breast: Secondary | ICD-10-CM

## 2024-02-13 ENCOUNTER — Encounter (HOSPITAL_BASED_OUTPATIENT_CLINIC_OR_DEPARTMENT_OTHER): Payer: Self-pay

## 2024-02-13 ENCOUNTER — Ambulatory Visit (HOSPITAL_BASED_OUTPATIENT_CLINIC_OR_DEPARTMENT_OTHER)
Admission: RE | Admit: 2024-02-13 | Discharge: 2024-02-13 | Disposition: A | Discharge status: Home / Self Care | Source: Ambulatory Visit | Attending: Family Medicine | Admitting: Family Medicine

## 2024-02-13 ENCOUNTER — Other Ambulatory Visit (HOSPITAL_BASED_OUTPATIENT_CLINIC_OR_DEPARTMENT_OTHER)

## 2024-02-13 DIAGNOSIS — Z1231 Encounter for screening mammogram for malignant neoplasm of breast: Secondary | ICD-10-CM | POA: Insufficient documentation

## 2024-02-20 ENCOUNTER — Inpatient Hospital Stay: Payer: Medicare Other | Attending: Oncology | Admitting: Oncology

## 2024-02-20 ENCOUNTER — Inpatient Hospital Stay: Payer: Self-pay

## 2024-02-20 ENCOUNTER — Other Ambulatory Visit: Payer: Medicare Other

## 2024-02-20 VITALS — BP 140/60 | HR 64 | Temp 98.2°F | Resp 18 | Ht 63.5 in | Wt 194.7 lb

## 2024-02-20 DIAGNOSIS — E785 Hyperlipidemia, unspecified: Secondary | ICD-10-CM | POA: Insufficient documentation

## 2024-02-20 DIAGNOSIS — R0600 Dyspnea, unspecified: Secondary | ICD-10-CM | POA: Diagnosis not present

## 2024-02-20 DIAGNOSIS — D696 Thrombocytopenia, unspecified: Secondary | ICD-10-CM

## 2024-02-20 DIAGNOSIS — Z79899 Other long term (current) drug therapy: Secondary | ICD-10-CM | POA: Diagnosis not present

## 2024-02-20 DIAGNOSIS — I2510 Atherosclerotic heart disease of native coronary artery without angina pectoris: Secondary | ICD-10-CM | POA: Insufficient documentation

## 2024-02-20 DIAGNOSIS — I10 Essential (primary) hypertension: Secondary | ICD-10-CM | POA: Diagnosis not present

## 2024-02-20 DIAGNOSIS — I255 Ischemic cardiomyopathy: Secondary | ICD-10-CM | POA: Diagnosis not present

## 2024-02-20 DIAGNOSIS — Z7901 Long term (current) use of anticoagulants: Secondary | ICD-10-CM | POA: Insufficient documentation

## 2024-02-20 DIAGNOSIS — I4891 Unspecified atrial fibrillation: Secondary | ICD-10-CM | POA: Insufficient documentation

## 2024-02-20 DIAGNOSIS — R634 Abnormal weight loss: Secondary | ICD-10-CM | POA: Diagnosis not present

## 2024-02-20 LAB — CBC WITH DIFFERENTIAL (CANCER CENTER ONLY)
Abs Immature Granulocytes: 0.01 10*3/uL (ref 0.00–0.07)
Basophils Absolute: 0.1 10*3/uL (ref 0.0–0.1)
Basophils Relative: 1 %
Eosinophils Absolute: 0.3 10*3/uL (ref 0.0–0.5)
Eosinophils Relative: 6 %
HCT: 42.1 % (ref 36.0–46.0)
Hemoglobin: 14 g/dL (ref 12.0–15.0)
Immature Granulocytes: 0 %
Lymphocytes Relative: 30 %
Lymphs Abs: 1.3 10*3/uL (ref 0.7–4.0)
MCH: 31 pg (ref 26.0–34.0)
MCHC: 33.3 g/dL (ref 30.0–36.0)
MCV: 93.3 fL (ref 80.0–100.0)
Monocytes Absolute: 0.5 10*3/uL (ref 0.1–1.0)
Monocytes Relative: 11 %
Neutro Abs: 2.3 10*3/uL (ref 1.7–7.7)
Neutrophils Relative %: 52 %
Platelet Count: 84 10*3/uL — ABNORMAL LOW (ref 150–400)
RBC: 4.51 MIL/uL (ref 3.87–5.11)
RDW: 14.3 % (ref 11.5–15.5)
WBC Count: 4.4 10*3/uL (ref 4.0–10.5)
nRBC: 0 % (ref 0.0–0.2)

## 2024-02-20 LAB — LACTATE DEHYDROGENASE: LDH: 291 U/L — ABNORMAL HIGH (ref 98–192)

## 2024-02-20 NOTE — Progress Notes (Signed)
  [facility] OFFICE PROGRESS NOTE   Diagnosis: Thrombocytopenia, hemochromatosis heterozygote  INTERVAL HISTORY:   Melissa Clements returns as scheduled.  She generally feels well.  Good appetite.  No fever, night sweats, or bleeding.  She has occasional dyspnea after walking.  No cough.  She reports intentional weight loss.  Objective:  Vital signs in last 24 hours:  Blood pressure (!) 140/60, pulse 64, temperature 98.2 F (36.8 C), temperature source Temporal, resp. rate 18, height 5' 3.5" (1.613 m), weight 194 lb 11.2 oz (88.3 kg), SpO2 98%.     Lymphatics: No cervical, supraclavicular, axillary, or inguinal nodes Resp: Inspiratory rales at the right greater than left lower posterior chest and right upper posterior chest, no respiratory distress Cardio: Regular rate and rhythm with premature beats GI: No hepatosplenomegaly Vascular: No leg edema  Lab Results:  Lab Results  Component Value Date   WBC 4.4 02/20/2024   HGB 14.0 02/20/2024   HCT 42.1 02/20/2024   MCV 93.3 02/20/2024   PLT 84 (L) 02/20/2024   NEUTROABS 2.3 02/20/2024    CMP  Lab Results  Component Value Date   NA 138 06/23/2023   K 4.0 06/23/2023   CL 106 06/23/2023   CO2 26 06/23/2023   GLUCOSE 96 06/23/2023   BUN 27 (H) 06/23/2023   CREATININE 0.97 06/23/2023   CALCIUM  9.6 06/23/2023   PROT 6.8 06/23/2023   ALBUMIN 3.6 06/23/2023   AST 50 (H) 06/23/2023   ALT 27 06/23/2023   ALKPHOS 85 06/23/2023   BILITOT 0.9 06/23/2023   GFRNONAA >60 06/23/2023   GFRAA 59 (L) 08/25/2020    Medications: I have reviewed the patient's current medications.   Assessment/Plan:  Hemochromatosis C282Y heterozygote Elevated ferritin in July 2022, status post 3 phlebotomy treatments-last on 06/12/2021 Elevated liver enzymes in 2022, improved with dose reduction of Lipitor Thrombocytopenia-mild, chronic Atrial fibrillation-Xarelto  History of coronary artery disease Ischemic  cardiomyopathy Hyperlipidemia Hypertension    Disposition: Melissa Clements has chronic mild thrombocytopenia.  The thrombocytopenia is most likely related to chronic ITP or less likely a bone marrow process such as myelodysplasia.  She will call for spontaneous bleeding or bruising.  We will consider a diagnostic bone marrow biopsy if she develops progressive thrombocytopenia or another hematologic abnormality. She is scheduled for follow-up with Dr. Earmon Glow next week for monitoring of liver enzymes.  The pulmonary exam is abnormal again today.  She does not have significant respiratory symptoms.  She should seek medical attention for a cough or dyspnea.  Melissa Clements will return for an office and lab visit in 6 months.  Coni Deep, MD  02/20/2024  12:21 PM

## 2024-02-21 ENCOUNTER — Telehealth: Payer: Self-pay | Admitting: Oncology

## 2024-02-21 NOTE — Telephone Encounter (Signed)
 Patient has been scheduled for follow-up visit per 02/20/24 LOS.  Pt noted appt details on personal planner/calendar.

## 2024-02-23 DIAGNOSIS — E782 Mixed hyperlipidemia: Secondary | ICD-10-CM | POA: Diagnosis not present

## 2024-03-01 DIAGNOSIS — R0989 Other specified symptoms and signs involving the circulatory and respiratory systems: Secondary | ICD-10-CM | POA: Diagnosis not present

## 2024-03-03 DIAGNOSIS — E782 Mixed hyperlipidemia: Secondary | ICD-10-CM | POA: Diagnosis not present

## 2024-03-03 DIAGNOSIS — N1831 Chronic kidney disease, stage 3a: Secondary | ICD-10-CM | POA: Diagnosis not present

## 2024-03-03 DIAGNOSIS — I480 Paroxysmal atrial fibrillation: Secondary | ICD-10-CM | POA: Diagnosis not present

## 2024-03-11 ENCOUNTER — Other Ambulatory Visit: Payer: Self-pay | Admitting: Cardiology

## 2024-03-11 DIAGNOSIS — I480 Paroxysmal atrial fibrillation: Secondary | ICD-10-CM

## 2024-03-12 NOTE — Telephone Encounter (Signed)
 Prescription refill request for Xarelto  received.  Indication:afib Last office visit:4/25 Weight:88.3  kg Age:78 Scr:0.97  9/24 CrCl:66.63  ml/min  Prescription refilled

## 2024-03-30 ENCOUNTER — Other Ambulatory Visit: Payer: Self-pay | Admitting: Cardiology

## 2024-04-02 DIAGNOSIS — I480 Paroxysmal atrial fibrillation: Secondary | ICD-10-CM | POA: Diagnosis not present

## 2024-04-02 DIAGNOSIS — N1831 Chronic kidney disease, stage 3a: Secondary | ICD-10-CM | POA: Diagnosis not present

## 2024-04-02 DIAGNOSIS — E782 Mixed hyperlipidemia: Secondary | ICD-10-CM | POA: Diagnosis not present

## 2024-04-04 ENCOUNTER — Ambulatory Visit (HOSPITAL_BASED_OUTPATIENT_CLINIC_OR_DEPARTMENT_OTHER)
Admission: RE | Admit: 2024-04-04 | Discharge: 2024-04-04 | Disposition: A | Discharge status: Home / Self Care | Source: Ambulatory Visit | Attending: Family Medicine | Admitting: Family Medicine

## 2024-04-04 DIAGNOSIS — M8588 Other specified disorders of bone density and structure, other site: Secondary | ICD-10-CM | POA: Diagnosis not present

## 2024-04-04 DIAGNOSIS — Z780 Asymptomatic menopausal state: Secondary | ICD-10-CM | POA: Diagnosis not present

## 2024-04-04 DIAGNOSIS — M8589 Other specified disorders of bone density and structure, multiple sites: Secondary | ICD-10-CM | POA: Diagnosis not present

## 2024-04-18 ENCOUNTER — Other Ambulatory Visit: Payer: Self-pay | Admitting: Cardiology

## 2024-05-03 DIAGNOSIS — N1831 Chronic kidney disease, stage 3a: Secondary | ICD-10-CM | POA: Diagnosis not present

## 2024-05-03 DIAGNOSIS — E782 Mixed hyperlipidemia: Secondary | ICD-10-CM | POA: Diagnosis not present

## 2024-05-03 DIAGNOSIS — I480 Paroxysmal atrial fibrillation: Secondary | ICD-10-CM | POA: Diagnosis not present

## 2024-06-03 DIAGNOSIS — N1831 Chronic kidney disease, stage 3a: Secondary | ICD-10-CM | POA: Diagnosis not present

## 2024-06-03 DIAGNOSIS — I480 Paroxysmal atrial fibrillation: Secondary | ICD-10-CM | POA: Diagnosis not present

## 2024-06-03 DIAGNOSIS — E782 Mixed hyperlipidemia: Secondary | ICD-10-CM | POA: Diagnosis not present

## 2024-07-03 DIAGNOSIS — E782 Mixed hyperlipidemia: Secondary | ICD-10-CM | POA: Diagnosis not present

## 2024-07-03 DIAGNOSIS — N1831 Chronic kidney disease, stage 3a: Secondary | ICD-10-CM | POA: Diagnosis not present

## 2024-07-03 DIAGNOSIS — I480 Paroxysmal atrial fibrillation: Secondary | ICD-10-CM | POA: Diagnosis not present

## 2024-07-31 ENCOUNTER — Ambulatory Visit: Attending: Cardiology

## 2024-07-31 ENCOUNTER — Encounter: Payer: Self-pay | Admitting: *Deleted

## 2024-07-31 ENCOUNTER — Encounter: Payer: Self-pay | Admitting: Cardiology

## 2024-07-31 ENCOUNTER — Ambulatory Visit: Attending: Cardiology | Admitting: Cardiology

## 2024-07-31 ENCOUNTER — Other Ambulatory Visit: Payer: Self-pay | Admitting: *Deleted

## 2024-07-31 VITALS — BP 144/74 | HR 78 | Ht 63.5 in | Wt 188.0 lb

## 2024-07-31 DIAGNOSIS — I2510 Atherosclerotic heart disease of native coronary artery without angina pectoris: Secondary | ICD-10-CM

## 2024-07-31 DIAGNOSIS — Z9861 Coronary angioplasty status: Secondary | ICD-10-CM

## 2024-07-31 DIAGNOSIS — I480 Paroxysmal atrial fibrillation: Secondary | ICD-10-CM

## 2024-07-31 DIAGNOSIS — E785 Hyperlipidemia, unspecified: Secondary | ICD-10-CM | POA: Diagnosis not present

## 2024-07-31 DIAGNOSIS — I10 Essential (primary) hypertension: Secondary | ICD-10-CM | POA: Diagnosis not present

## 2024-07-31 NOTE — Progress Notes (Unsigned)
 Cardiology Office Note:    Date:  07/31/2024   ID:  Melissa Clements, DOB 24-Mar-1946, MRN [id number]  PCP:  Aisha Harvey, MD  Cardiologist:  Lamar Fitch, MD    Referring MD: Aisha Harvey, MD   Chief Complaint  Patient presents with   Follow-up    History of Present Illness:    Melissa Clements is a 78 y.o. female past medical history significant for coronary artery disease.  In 2021 she did have PTCA and angioplasty of LAD secondary to myocardial infarction however it was late presentation with left her with ejection fraction of 45% later echocardiogram showed normalization additional problem clued paroxysmal atrial fibrillation she is on Xarelto , hemochromatosis, dyslipidemia difficulty increasing dosages of medication because of liver dysfunction.  Comes today to months for follow-up doing well denies have any chest pain tightness squeezing pressure burning chest no palpitation dizziness overall well  Past Medical History:  Diagnosis Date   Arthritis    end stage right hip   Basal cell carcinoma    Benign essential HTN 01/23/2015   CAD S/P percutaneous coronary angioplasty 11/06/2019   LAD PCI with DES 11/02/2019   Chronic kidney disease    stage III, related to high blood pressure medication   Colon polyp    CRI (chronic renal insufficiency), stage 3 (moderate) 11/06/2019   No ACE or ARB   Depression    Dizziness 01/23/2015   Dyslipidemia, goal LDL below 70 11/06/2019   LDL 84- changed from Zocor  to Lipitor 80 mg   Dyspnea on exertion 11/01/2019   Elevated liver enzymes 02/03/2021   Hematoma 11/06/2019   Hematoma and ecchymosis Rt radial site post PCI-(anticoagulation for new AF held).    History of chest pain    History of dizziness    History of palpitations    Hyperlipidemia    Hypertension    Ischemic cardiomyopathy 11/06/2019   EF 45%   NSTEMI (non-ST elevated myocardial infarction) (HCC) 11/01/2019   S/P NSTEMI 11/03/2019   Obese 01/25/2018   Palpitations 01/23/2015    Paroxysmal atrial fibrillation (HCC) 08/25/2020   Persistent atrial fibrillation (HCC)    New diagnosis 11/03/2019- not anticoagulated yet- minimal symptoms- rate controlled   Precordial chest pain 01/23/2015   Negative stress test in the spring 2019   S/P right THA, AA 01/24/2018   Sinus bradycardia 01/20/2018    Past Surgical History:  Procedure Laterality Date   CARDIAC CATHETERIZATION     CARDIOVERSION N/A 01/29/2020   Procedure: CARDIOVERSION;  Surgeon: Raford Riggs, MD;  Location: [facility] ENDOSCOPY;  Service: Cardiovascular;  Laterality: N/A;   CATARACT EXTRACTION, BILATERAL     COLONOSCOPY     CORONARY STENT INTERVENTION N/A 11/02/2019   Procedure: CORONARY STENT INTERVENTION;  Surgeon: Anner Alm ORN, MD;  Location: [facility] INVASIVE CV LAB;  Service: Cardiovascular;  Laterality: N/A;   DIAGNOSTIC LAPAROSCOPY     DILATION AND CURETTAGE OF UTERUS     LEFT HEART CATH AND CORONARY ANGIOGRAPHY N/A 11/02/2019   Procedure: LEFT HEART CATH AND CORONARY ANGIOGRAPHY;  Surgeon: Anner Alm ORN, MD;  Location: [facility] INVASIVE CV LAB;  Service: Cardiovascular;  Laterality: N/A;   TOTAL HIP ARTHROPLASTY Right 01/24/2018   Procedure: RIGHT TOTAL HIP ARTHROPLASTY ANTERIOR APPROACH;  Surgeon: Ernie Cough, MD;  Location: [facility] ORS;  Service: Orthopedics;  Laterality: Right;  70 mins    Current Medications: Current Meds  Medication Sig   atorvastatin  (LIPITOR) 40 MG tablet Take 40 mg by mouth daily.   Cholecalciferol (  VITAMIN D) 2000 units tablet Take 2,000 Units by mouth every other day.    docusate sodium  (COLACE) 100 MG capsule Take 100 mg by mouth daily.   lisinopril  (ZESTRIL ) 2.5 MG tablet Take one tablet (2.5 mg total) by mouth daily.   metoprolol  succinate (TOPROL -XL) 50 MG 24 hr tablet TAKE ONE-HALF TABLET (25 MG ) BY MOUTH DAILY   nitroGLYCERIN  (NITROSTAT ) 0.4 MG SL tablet Place 1 tablet (0.4 mg total) under the tongue every 5 (five) minutes x 3 doses as needed for chest pain.   rivaroxaban   (XARELTO ) 20 MG TABS tablet TAKE ONE TABLET BY MOUTH ONCE A DAY WITH SUPPER     Allergies:   Pollen extract   Social History   Socioeconomic History   Marital status: Married    Spouse name: norleen   Number of children: 1   Years of education: college   Highest education level: Associate degree: academic program  Occupational History   Occupation: retired  Tobacco Use   Smoking status: Never   Smokeless tobacco: Never  Vaping Use   Vaping status: Never Used  Substance and Sexual Activity   Alcohol use: No    Alcohol/week: 0.0 standard drinks of alcohol   Drug use: No   Sexual activity: Not on file  Other Topics Concern   Not on file  Social History Narrative   Not on file   Social Drivers of Health   Financial Resource Strain: Not on file  Food Insecurity: No Food Insecurity (08/22/2023)   Hunger Vital Sign    Worried About Running Out of Food in the Last Year: Never true    Ran Out of Food in the Last Year: Never true  Transportation Needs: No Transportation Needs (08/22/2023)   PRAPARE - Administrator, Civil Service (Medical): No    Lack of Transportation (Non-Medical): No  Physical Activity: Not on file  Stress: Not on file  Social Connections: Not on file     Family History: The patient's family history includes Alzheimer's disease in her brother and mother; Diabetes in her mother; Heart attack (age of onset: 66) in her father; Heart disease in her father. ROS:   Please see the history of present illness.    All 14 point review of systems negative except as described per history of present illness  EKGs/Labs/Other Studies Reviewed:    EKG Interpretation Date/Time:  Tuesday July 31 2024 14:15:01 EDT Ventricular Rate:  78 PR Interval:    QRS Duration:  80 QT Interval:  370 QTC Calculation: 421 R Axis:   -9  Text Interpretation: Atrial fibrillation Septal infarct (cited on or before 10-May-2023) When compared with ECG of 10-May-2023 15:28,  Atrial fibrillation has replaced Junctional rhythm Questionable change in initial forces of Septal leads Confirmed by Bernie Charleston [id number]) on 07/31/2024 2:25:53 PM    Recent Labs: 02/20/2024: Hemoglobin 14.0; Platelet Count 84  Recent Lipid Panel    Component Value Date/Time   CHOL 178 04/29/2022 1023   TRIG 78 04/29/2022 1023   HDL 66 04/29/2022 1023   CHOLHDL 2.7 04/29/2022 1023   CHOLHDL 2.6 11/02/2019 0344   VLDL 11 11/02/2019 0344   LDLCALC 98 04/29/2022 1023    Physical Exam:    VS:  BP (!) 144/74   Pulse 78   Ht 5' 3.5 (1.613 m)   Wt 188 lb (85.3 kg)   SpO2 99%   BMI 32.78 kg/m     Wt Readings from Last 3 Encounters:  07/31/24 188 lb (85.3 kg)  02/20/24 194 lb 11.2 oz (88.3 kg)  01/17/24 196 lb (88.9 kg)     GEN:  Well nourished, well developed in no acute distress HEENT: Normal NECK: No JVD; No carotid bruits LYMPHATICS: No lymphadenopathy CARDIAC: Irregularly irregular, no murmurs, no rubs, no gallops RESPIRATORY:  Clear to auscultation without rales, wheezing or rhonchi  ABDOMEN: Soft, non-tender, non-distended MUSCULOSKELETAL:  No edema; No deformity  SKIN: Warm and dry LOWER EXTREMITIES: no swelling NEUROLOGIC:  Alert and oriented x 3 PSYCHIATRIC:  Normal affect   ASSESSMENT:    1. Paroxysmal atrial fibrillation (HCC)   2. CAD S/P percutaneous coronary angioplasty   3. Benign essential HTN   4. Dyslipidemia, goal LDL below 70    PLAN:    In order of problems listed above:  Paroxysmal atrial fibrillation: Today she is atrial fibrillation she feels fine completely asymptomatic she has no idea how long she has been in it I asked her to wear Zio patch for 2 weeks to see if this is paroxysmal or persistent and will make a decision what to do next.  I will also schedule her to have echocardiogram to assess left ventricle ejection fraction as well as look for the size of atrium that would help us  determine how aggressive we need to be to get her back  to normal rhythm. Coronary artery disease completely asymptomatic doing well.  Taking care of her husband who does have depression. Benign essential hypertension blood pressure seems to be slightly on the higher side today we will continue monitoring. Dyslipidemia have difficulty tolerating medication but takes Lipitor 40 with good results her KPN show me LDL 62 HDL 48.  Will continue monitoring   Medication Adjustments/Labs and Tests Ordered: Current medicines are reviewed at length with the patient today.  Concerns regarding medicines are outlined above.  Orders Placed This Encounter  Procedures   EKG 12-Lead   Medication changes: No orders of the defined types were placed in this encounter.   Signed, Lamar DOROTHA Fitch, MD, [facility] 07/31/2024 2:36 PM    [facility]

## 2024-07-31 NOTE — Patient Instructions (Signed)
 Medication Instructions:  Your physician recommends that you continue on your current medications as directed. Please refer to the Current Medication list given to you today.  *If you need a refill on your cardiac medications before your next appointment, please call your pharmacy*   Lab Work: None Ordered If you have labs (blood work) drawn today and your tests are completely normal, you will receive your results only by: MyChart Message (if you have MyChart) OR A paper copy in the mail If you have any lab test that is abnormal or we need to change your treatment, we will call you to review the results.   Testing/Procedures: Your physician has requested that you have an echocardiogram. Echocardiography is a painless test that uses sound waves to create images of your heart. It provides your doctor with information about the size and shape of your heart and how well your heart's chambers and valves are working. This procedure takes approximately one hour. There are no restrictions for this procedure. Please do NOT wear cologne, perfume, aftershave, or lotions (deodorant is allowed). Please arrive 15 minutes prior to your appointment time.  Please note: We ask at that you not bring children with you during ultrasound (echo/ vascular) testing. Due to room size and safety concerns, children are not allowed in the ultrasound rooms during exams. Our front office staff cannot provide observation of children in our lobby area while testing is being conducted. An adult accompanying a patient to their appointment will only be allowed in the ultrasound room at the discretion of the ultrasound technician under special circumstances. We apologize for any inconvenience.    WHY IS MY DOCTOR PRESCRIBING ZIO? The Zio system is proven and trusted by physicians to detect and diagnose irregular heart rhythms -- and has been prescribed to hundreds of thousands of patients.  The FDA has cleared the Zio system to  monitor for many different kinds of irregular heart rhythms. In a study, physicians were able to reach a diagnosis 90% of the time with the Zio system1.  You can wear the Zio monitor -- a small, discreet, comfortable patch -- during your normal day-to-day activity, including while you sleep, shower, and exercise, while it records every single heartbeat for analysis.  1Barrett, P., et al. Comparison of 24 Hour Holter Monitoring Versus 14 Day Novel Adhesive Patch Electrocardiographic Monitoring. American Journal of Medicine, 2014.  ZIO VS. HOLTER MONITORING The Zio monitor can be comfortably worn for up to 14 days. Holter monitors can be worn for 24 to 48 hours, limiting the time to record any irregular heart rhythms you may have. Zio is able to capture data for the 51% of patients who have their first symptom-triggered arrhythmia after 48 hours.1  LIVE WITHOUT RESTRICTIONS The Zio ambulatory cardiac monitor is a small, unobtrusive, and water-resistant patch--you might even forget you're wearing it. The Zio monitor records and stores every beat of your heart, whether you're sleeping, working out, or showering.     Follow-Up: At [facility], you and your health needs are our priority.  As part of our continuing mission to provide you with exceptional heart care, we have created designated Provider Care Teams.  These Care Teams include your primary Cardiologist (physician) and Advanced Practice Providers (APPs -  Physician Assistants and Nurse Practitioners) who all work together to provide you with the care you need, when you need it.  We recommend signing up for the patient portal called "MyChart".  Sign up information is provided on this  After Visit Summary.  MyChart is used to connect with patients for Virtual Visits (Telemedicine).  Patients are able to view lab/test results, encounter notes, upcoming appointments, etc.  Non-urgent messages can be sent to your provider as well.   To learn more  about what you can do with MyChart, go to [email or web address].    Your next appointment:   6 week(s)  The format for your next appointment:   In Person  Provider:   Gypsy Balsam, MD    Other Instructions NA

## 2024-08-06 ENCOUNTER — Ambulatory Visit (HOSPITAL_BASED_OUTPATIENT_CLINIC_OR_DEPARTMENT_OTHER)

## 2024-08-20 ENCOUNTER — Ambulatory Visit (HOSPITAL_BASED_OUTPATIENT_CLINIC_OR_DEPARTMENT_OTHER)
Admission: RE | Admit: 2024-08-20 | Discharge: 2024-08-20 | Disposition: A | Discharge status: Home / Self Care | Source: Ambulatory Visit | Attending: Cardiology | Admitting: Cardiology

## 2024-08-20 DIAGNOSIS — I480 Paroxysmal atrial fibrillation: Secondary | ICD-10-CM | POA: Insufficient documentation

## 2024-08-20 LAB — ECHOCARDIOGRAM COMPLETE
AR max vel: 1.79 cm2
AV Area VTI: 1.83 cm2
AV Area mean vel: 1.87 cm2
AV Mean grad: 4 mmHg
AV Peak grad: 6.8 mmHg
Ao pk vel: 1.3 m/s
Area-P 1/2: 3.91 cm2
Calc EF: 77.8 %
MV M vel: 4.8 m/s
MV Peak grad: 92.2 mmHg
S' Lateral: 2.1 cm
Single Plane A2C EF: 76.2 %
Single Plane A4C EF: 77 %

## 2024-08-21 ENCOUNTER — Inpatient Hospital Stay: Admitting: Oncology

## 2024-08-21 ENCOUNTER — Inpatient Hospital Stay: Attending: Oncology

## 2024-08-21 ENCOUNTER — Ambulatory Visit: Payer: Self-pay | Admitting: Oncology

## 2024-08-21 VITALS — BP 123/78 | HR 78 | Temp 97.8°F | Resp 18 | Ht 63.5 in | Wt 187.9 lb

## 2024-08-21 DIAGNOSIS — I4891 Unspecified atrial fibrillation: Secondary | ICD-10-CM | POA: Insufficient documentation

## 2024-08-21 DIAGNOSIS — I10 Essential (primary) hypertension: Secondary | ICD-10-CM | POA: Insufficient documentation

## 2024-08-21 DIAGNOSIS — I2510 Atherosclerotic heart disease of native coronary artery without angina pectoris: Secondary | ICD-10-CM | POA: Insufficient documentation

## 2024-08-21 DIAGNOSIS — Z7901 Long term (current) use of anticoagulants: Secondary | ICD-10-CM | POA: Diagnosis not present

## 2024-08-21 DIAGNOSIS — D696 Thrombocytopenia, unspecified: Secondary | ICD-10-CM | POA: Insufficient documentation

## 2024-08-21 DIAGNOSIS — I255 Ischemic cardiomyopathy: Secondary | ICD-10-CM | POA: Insufficient documentation

## 2024-08-21 DIAGNOSIS — Z79899 Other long term (current) drug therapy: Secondary | ICD-10-CM | POA: Insufficient documentation

## 2024-08-21 DIAGNOSIS — E785 Hyperlipidemia, unspecified: Secondary | ICD-10-CM | POA: Diagnosis not present

## 2024-08-21 LAB — CBC WITH DIFFERENTIAL (CANCER CENTER ONLY)
Abs Immature Granulocytes: 0.01 K/uL (ref 0.00–0.07)
Basophils Absolute: 0 K/uL (ref 0.0–0.1)
Basophils Relative: 1 %
Eosinophils Absolute: 0.5 K/uL (ref 0.0–0.5)
Eosinophils Relative: 12 %
HCT: 42.5 % (ref 36.0–46.0)
Hemoglobin: 14.3 g/dL (ref 12.0–15.0)
Immature Granulocytes: 0 %
Lymphocytes Relative: 33 %
Lymphs Abs: 1.3 K/uL (ref 0.7–4.0)
MCH: 32.1 pg (ref 26.0–34.0)
MCHC: 33.6 g/dL (ref 30.0–36.0)
MCV: 95.5 fL (ref 80.0–100.0)
Monocytes Absolute: 0.4 K/uL (ref 0.1–1.0)
Monocytes Relative: 9 %
Neutro Abs: 1.8 K/uL (ref 1.7–7.7)
Neutrophils Relative %: 45 %
Platelet Count: 86 K/uL — ABNORMAL LOW (ref 150–400)
RBC: 4.45 MIL/uL (ref 3.87–5.11)
RDW: 14.2 % (ref 11.5–15.5)
WBC Count: 4 K/uL (ref 4.0–10.5)
nRBC: 0 % (ref 0.0–0.2)

## 2024-08-21 LAB — LACTATE DEHYDROGENASE: LDH: 279 U/L — ABNORMAL HIGH (ref 105–235)

## 2024-08-21 LAB — FERRITIN: Ferritin: 139 ng/mL (ref 11–307)

## 2024-08-21 NOTE — Progress Notes (Signed)
 [facility] OFFICE PROGRESS NOTE   Diagnosis: Hemochromatosis  INTERVAL HISTORY:   Melissa Clements returns as scheduled.  She feels well.  She has atrial fibrillation and takes Xarelto .  No bleeding.  No fever or night sweats.  Objective:  Vital signs in last 24 hours:  Blood pressure 123/78, pulse 78, temperature 97.8 F (36.6 C), temperature source Temporal, resp. rate 18, height 5' 3.5 (1.613 m), weight 187 lb 14.4 oz (85.2 kg), SpO2 100%.   Lymphatics: No cervical, supraclavicular, axillary, or inguinal nodes Resp: End inspiratory rales at the posterior base on the right greater than left, no respiratory distress Cardio: Irregular GI: No hepatosplenomegaly Vascular: No leg edema   Lab Results:  Lab Results  Component Value Date   WBC 4.4 02/20/2024   HGB 14.0 02/20/2024   HCT 42.1 02/20/2024   MCV 93.3 02/20/2024   PLT 84 (L) 02/20/2024   NEUTROABS 2.3 02/20/2024    CMP  Lab Results  Component Value Date   NA 138 06/23/2023   K 4.0 06/23/2023   CL 106 06/23/2023   CO2 26 06/23/2023   GLUCOSE 96 06/23/2023   BUN 27 (H) 06/23/2023   CREATININE 0.97 06/23/2023   CALCIUM  9.6 06/23/2023   PROT 6.8 06/23/2023   ALBUMIN 3.6 06/23/2023   AST 50 (H) 06/23/2023   ALT 27 06/23/2023   ALKPHOS 85 06/23/2023   BILITOT 0.9 06/23/2023   GFRNONAA >60 06/23/2023   GFRAA 59 (L) 08/25/2020    No results found for: CEA1, CEA, CAN199, CA125  Lab Results  Component Value Date   INR 1.1 11/01/2019   LABPROT 13.7 11/01/2019    Imaging:  ECHOCARDIOGRAM COMPLETE Result Date: 08/20/2024    ECHOCARDIOGRAM REPORT   Patient Name:   Melissa Clements Date of Exam: 08/20/2024 Medical Rec #:  [id number]       Height:       63.5 in Accession #:    [id number]      Weight:       188.0 lb Date of Birth:  05-12-1946        BSA:          1.895 m Patient Age:    78 years        BP:           144/74 mmHg Patient Gender: F               HR:           80 bpm. Exam  Location:  [place] Procedure: 2D Echo, Cardiac Doppler, Color Doppler and Strain Analysis (Both            Spectral and Color Flow Doppler were utilized during procedure). Indications:    Paroxysmal atrial fibrillation (HCC) [I48.0 (ICD-10-CM)]  History:        Patient has prior history of Echocardiogram examinations, most                 recent 11/06/2022. Cardiomyopathy, Previous Myocardial Infarction,                 CKD, Arrythmias:Atrial Fibrillation and Bradycardia; Risk                 Factors:Hypertension and Non-Smoker.  Sonographer:    Alan Greenhouse RDMS, RVT, RDCS Referring Phys: [phone] LAMAR JINNY FITCH  Sonographer Comments: Image acquisition challenging due to patient body habitus. IMPRESSIONS  1. Technically difficult study.  2. Left ventricular ejection fraction, by estimation, is  55 to 60%. The left ventricle has normal function. The left ventricle has no regional wall motion abnormalities. Left ventricular diastolic parameters are indeterminate. The average left ventricular global longitudinal strain is -20.1 %.  3. Right ventricular systolic function is normal. The right ventricular size is normal. There is normal pulmonary artery systolic pressure.  4. The mitral valve is grossly normal. Mild mitral valve regurgitation. No evidence of mitral stenosis.  5. The aortic valve is tricuspid. There is mild thickening of the aortic valve. Aortic valve regurgitation is not visualized. No aortic stenosis is present.  6. The inferior vena cava is normal in size with greater than 50% respiratory variability, suggesting right atrial pressure of 3 mmHg.  7. Rhythm strip during this exam demonstrates atrial fibrillation. Comparison(s): Echocardiogram done 11/06/22 showed an EF of 65-70%. FINDINGS  Left Ventricle: Left ventricular ejection fraction, by estimation, is 55 to 60%. The left ventricle has normal function. The left ventricle has no regional wall motion abnormalities. The average left ventricular  global longitudinal strain is -20.1 %. The left ventricular internal cavity size was normal in size. There is no left ventricular hypertrophy. Left ventricular diastolic parameters are indeterminate. Right Ventricle: The right ventricular size is normal. Right vetricular wall thickness was not well visualized. Right ventricular systolic function is normal. There is normal pulmonary artery systolic pressure. The tricuspid regurgitant velocity is 1.53 m/s, and with an assumed right atrial pressure of 3 mmHg, the estimated right ventricular systolic pressure is 12.4 mmHg. Left Atrium: Left atrial size was normal in size. Right Atrium: Right atrial size was normal in size. Pericardium: There is no evidence of pericardial effusion. Mitral Valve: The mitral valve is grossly normal. Mild mitral valve regurgitation. No evidence of mitral valve stenosis. Tricuspid Valve: The tricuspid valve is not well visualized. Tricuspid valve regurgitation is trivial. No evidence of tricuspid stenosis. Aortic Valve: The aortic valve is tricuspid. There is mild thickening of the aortic valve. Aortic valve regurgitation is not visualized. No aortic stenosis is present. Aortic valve mean gradient measures 4.0 mmHg. Aortic valve peak gradient measures 6.8 mmHg. Aortic valve area, by VTI measures 1.83 cm. Pulmonic Valve: The pulmonic valve was not well visualized. Pulmonic valve regurgitation is not visualized. No evidence of pulmonic stenosis. Aorta: The aortic root is normal in size and structure and the ascending aorta was not well visualized. Venous: The inferior vena cava is normal in size with greater than 50% respiratory variability, suggesting right atrial pressure of 3 mmHg. IAS/Shunts: The interatrial septum was not well visualized. EKG: Rhythm strip during this exam demonstrates atrial fibrillation.  LEFT VENTRICLE PLAX 2D LVIDd:         3.10 cm     Diastology LVIDs:         2.10 cm     LV e' medial:    8.54 cm/s LV PW:          0.90 cm     LV E/e' medial:  13.7 LV IVS:        0.90 cm     LV e' lateral:   12.65 cm/s LVOT diam:     1.80 cm     LV E/e' lateral: 9.2 LV SV:         55 LV SV Index:   29          2D Longitudinal Strain LVOT Area:     2.54 cm    2D Strain GLS (A4C):   -20.3 %  2D Strain GLS (A3C):   -21.1 %                            2D Strain GLS (A2C):   -19.3 % LV Volumes (MOD)           2D Strain GLS Avg:     -20.1 % LV vol d, MOD A2C: 34.1 ml LV vol d, MOD A4C: 29.6 ml LV vol s, MOD A2C: 8.1 ml LV vol s, MOD A4C: 6.8 ml LV SV MOD A2C:     26.0 ml LV SV MOD A4C:     29.6 ml LV SV MOD BP:      26.9 ml RIGHT VENTRICLE RV S prime:     8.87 cm/s  PULMONARY VEINS TAPSE (M-mode): 1.3 cm     Diastolic Velocity: 20.50 cm/s                            S/D Velocity:       2.00                            Systolic Velocity:  41.10 cm/s LEFT ATRIUM             Index        RIGHT ATRIUM           Index LA diam:        3.50 cm 1.85 cm/m   RA Area:     11.40 cm LA Vol (A2C):   37.2 ml 19.64 ml/m  RA Volume:   19.40 ml  10.24 ml/m LA Vol (A4C):   48.5 ml 25.60 ml/m LA Biplane Vol: 45.5 ml 24.02 ml/m  AORTIC VALVE AV Area (Vmax):    1.79 cm AV Area (Vmean):   1.87 cm AV Area (VTI):     1.83 cm AV Vmax:           130.00 cm/s AV Vmean:          99.200 cm/s AV VTI:            0.298 m AV Peak Grad:      6.8 mmHg AV Mean Grad:      4.0 mmHg LVOT Vmax:         91.30 cm/s LVOT Vmean:        72.750 cm/s LVOT VTI:          0.215 m LVOT/AV VTI ratio: 0.72  AORTA Ao Root diam: 3.00 cm MITRAL VALVE                TRICUSPID VALVE MV Area (PHT): 3.91 cm     TR Peak grad:   9.4 mmHg MV Decel Time: 194 msec     TR Vmax:        153.00 cm/s MR Peak grad: 92.2 mmHg MR Vmax:      480.00 cm/s   SHUNTS MV E velocity: 117.00 cm/s  Systemic VTI:  0.21 m MV A velocity: 25.90 cm/s   Systemic Diam: 1.80 cm MV E/A ratio:  4.52 Sreedhar reddy Madireddy Electronically signed by Alean reddy Madireddy Signature Date/Time:  08/20/2024/2:05:50 PM    Final     Medications: I have reviewed the patient's current medications.   Assessment/Plan: Hemochromatosis C282Y heterozygote Elevated ferritin in July 2022, status post 3 phlebotomy treatments-last on 06/12/2021 Elevated liver enzymes in 2022,  improved with dose reduction of Lipitor Thrombocytopenia-mild, chronic Atrial fibrillation-Xarelto  History of coronary artery disease Ischemic cardiomyopathy Hyperlipidemia Hypertension    Disposition: Melissa. Horacek is a heterozygote for the hemochromatosis gene.  The ferritin level remains normal.  She has chronic mild thrombocytopenia.  She is maintained on Xarelto  anticoagulation.  She will call for spontaneous bleeding or bruising.  The LDH has been mildly elevated for the past several years.  This is likely a benign nonspecific finding.  Melissa. Meder will return for an office and lab visit in 1 year.  I am available to see her in the interim as needed.  Arley Hof, MD  08/21/2024  10:26 AM

## 2024-08-21 NOTE — Telephone Encounter (Signed)
-----   Message from Arley Hof sent at 08/21/2024  1:23 PM EST ----- Please call patient the iron level is normal, platelets remain mildly decreased, call for bleeding, follow-up as scheduled  ----- Message ----- From: Rebecka, Lab In [place] Sent: 08/21/2024  10:40 AM EST To: Arley KATHEE Hof, MD

## 2024-08-21 NOTE — Telephone Encounter (Signed)
 Patient gave verbal understanding and had no further questions.

## 2024-08-23 ENCOUNTER — Ambulatory Visit: Payer: Self-pay | Admitting: Cardiology

## 2024-09-10 DIAGNOSIS — I480 Paroxysmal atrial fibrillation: Secondary | ICD-10-CM

## 2024-09-11 ENCOUNTER — Encounter: Payer: Self-pay | Admitting: Cardiology

## 2024-09-11 ENCOUNTER — Ambulatory Visit: Attending: Cardiology | Admitting: Cardiology

## 2024-09-11 VITALS — BP 150/84 | HR 52 | Ht 63.5 in | Wt 187.0 lb

## 2024-09-11 DIAGNOSIS — R748 Abnormal levels of other serum enzymes: Secondary | ICD-10-CM

## 2024-09-11 DIAGNOSIS — Z9861 Coronary angioplasty status: Secondary | ICD-10-CM

## 2024-09-11 DIAGNOSIS — R0609 Other forms of dyspnea: Secondary | ICD-10-CM

## 2024-09-11 DIAGNOSIS — I4819 Other persistent atrial fibrillation: Secondary | ICD-10-CM

## 2024-09-11 DIAGNOSIS — I2510 Atherosclerotic heart disease of native coronary artery without angina pectoris: Secondary | ICD-10-CM

## 2024-09-11 DIAGNOSIS — I10 Essential (primary) hypertension: Secondary | ICD-10-CM

## 2024-09-11 DIAGNOSIS — I480 Paroxysmal atrial fibrillation: Secondary | ICD-10-CM

## 2024-09-11 MED ORDER — RIVAROXABAN 20 MG PO TABS: 20.0000 mg | ORAL_TABLET | Freq: Every day | ORAL | 1 refills | Status: AC

## 2024-09-11 NOTE — Progress Notes (Signed)
 Cardiology Office Note:    Date:  09/11/2024   ID:  Melissa Clements, DOB [birth year], MRN [id number]  PCP:  Aisha Harvey, MD  Cardiologist:  Lamar Fitch, MD    Referring MD: Aisha Harvey, MD   Chief Complaint  Patient presents with   Follow-up    History of Present Illness:    Melissa Clements is a 78 y.o. female past medical history significant for coronary artery disease.  In 2021 she did have PTCA and angioplasty of left posterior descending artery in face of myocardial infarction, however it was late presentation and left her with diminished ejection fraction but last echocardiogram showed preserved left ventricular ejection fraction.  Also paroxysmal atrial fibrillation, anticoag with Xarelto , hemochromatosis, dyslipidemia with difficulty increasing medication secondary to liver dysfunction.  She showed up in my office about a month ago was in atrial fibrillation.  I did put a monitor on her which showed atrial fibrillation all throughout recording with no sinus rhythm recorded.  She comes today to talk about options option being continuation of rate control strategy versus rhythm control strategy.  We debated long time which way to go and finally which conclusion that best approach will be to proceed to evaluation by EP.  She will be referred to Dr. Inocencio for advice in terms of which way to proceed.  Past Medical History:  Diagnosis Date   Arthritis    end stage right hip   Basal cell carcinoma    Benign essential HTN 01/23/2015   CAD S/P percutaneous coronary angioplasty 11/06/2019   LAD PCI with DES 11/02/2019   Chronic kidney disease    stage III, related to high blood pressure medication   Colon polyp    CRI (chronic renal insufficiency), stage 3 (moderate) 11/06/2019   No ACE or ARB   Depression    Dizziness 01/23/2015   Dyslipidemia, goal LDL below 70 11/06/2019   LDL 84- changed from Zocor  to Lipitor 80 mg   Dyspnea on exertion 11/01/2019   Elevated liver enzymes  02/03/2021   Hematoma 11/06/2019   Hematoma and ecchymosis Rt radial site post PCI-(anticoagulation for new AF held).    History of chest pain    History of dizziness    History of palpitations    Hyperlipidemia    Hypertension    Ischemic cardiomyopathy 11/06/2019   EF 45%   NSTEMI (non-ST elevated myocardial infarction) (HCC) 11/01/2019   S/P NSTEMI 11/03/2019   Obese 01/25/2018   Palpitations 01/23/2015   Paroxysmal atrial fibrillation (HCC) 08/25/2020   Persistent atrial fibrillation (HCC)    New diagnosis 11/03/2019- not anticoagulated yet- minimal symptoms- rate controlled   Precordial chest pain 01/23/2015   Negative stress test in the spring 2019   S/P right THA, AA 01/24/2018   Sinus bradycardia 01/20/2018    Past Surgical History:  Procedure Laterality Date   CARDIAC CATHETERIZATION     CARDIOVERSION N/A 01/29/2020   Procedure: CARDIOVERSION;  Surgeon: Raford Riggs, MD;  Location: [facility] ENDOSCOPY;  Service: Cardiovascular;  Laterality: N/A;   CATARACT EXTRACTION, BILATERAL     COLONOSCOPY     CORONARY STENT INTERVENTION N/A 11/02/2019   Procedure: CORONARY STENT INTERVENTION;  Surgeon: Anner Alm ORN, MD;  Location: [facility] INVASIVE CV LAB;  Service: Cardiovascular;  Laterality: N/A;   DIAGNOSTIC LAPAROSCOPY     DILATION AND CURETTAGE OF UTERUS     LEFT HEART CATH AND CORONARY ANGIOGRAPHY N/A 11/02/2019   Procedure: LEFT HEART CATH AND CORONARY ANGIOGRAPHY;  Surgeon:  Anner Alm ORN, MD;  Location: [facility] INVASIVE CV LAB;  Service: Cardiovascular;  Laterality: N/A;   TOTAL HIP ARTHROPLASTY Right 01/24/2018   Procedure: RIGHT TOTAL HIP ARTHROPLASTY ANTERIOR APPROACH;  Surgeon: Ernie Cough, MD;  Location: [facility] ORS;  Service: Orthopedics;  Laterality: Right;  70 mins    Current Medications: Current Meds  Medication Sig   atorvastatin  (LIPITOR) 40 MG tablet Take 40 mg by mouth daily.   Cholecalciferol (VITAMIN D) 2000 units tablet Take 2,000 Units by mouth every other day.    docusate  sodium (COLACE) 100 MG capsule Take 100 mg by mouth daily.   lisinopril  (ZESTRIL ) 2.5 MG tablet Take one tablet (2.5 mg total) by mouth daily.   metoprolol  succinate (TOPROL -XL) 50 MG 24 hr tablet TAKE ONE-HALF TABLET (25 MG ) BY MOUTH DAILY   nitroGLYCERIN  (NITROSTAT ) 0.4 MG SL tablet Place 1 tablet (0.4 mg total) under the tongue every 5 (five) minutes x 3 doses as needed for chest pain.   rivaroxaban  (XARELTO ) 20 MG TABS tablet TAKE ONE TABLET BY MOUTH ONCE A DAY WITH SUPPER     Allergies:   Pollen extract   Social History   Socioeconomic History   Marital status: Married    Spouse name: norleen   Number of children: 1   Years of education: college   Highest education level: Associate degree: academic program  Occupational History   Occupation: retired  Tobacco Use   Smoking status: Never   Smokeless tobacco: Never  Vaping Use   Vaping status: Never Used  Substance and Sexual Activity   Alcohol use: No    Alcohol/week: 0.0 standard drinks of alcohol   Drug use: No   Sexual activity: Not on file  Other Topics Concern   Not on file  Social History Narrative   Not on file   Social Drivers of Health   Financial Resource Strain: Not on file  Food Insecurity: No Food Insecurity (08/22/2023)   Hunger Vital Sign    Worried About Running Out of Food in the Last Year: Never true    Ran Out of Food in the Last Year: Never true  Transportation Needs: No Transportation Needs (08/22/2023)   PRAPARE - Administrator, Civil Service (Medical): No    Lack of Transportation (Non-Medical): No  Physical Activity: Not on file  Stress: Not on file  Social Connections: Not on file     Family History: The patient's family history includes Alzheimer's disease in her brother and mother; Diabetes in her mother; Heart attack (age of onset: 16) in her father; Heart disease in her father. ROS:   Please see the history of present illness.    All 14 point review of systems negative  except as described per history of present illness  EKGs/Labs/Other Studies Reviewed:         Recent Labs: 08/21/2024: Hemoglobin 14.3; Platelet Count 86  Recent Lipid Panel    Component Value Date/Time   CHOL 178 04/29/2022 1023   TRIG 78 04/29/2022 1023   HDL 66 04/29/2022 1023   CHOLHDL 2.7 04/29/2022 1023   CHOLHDL 2.6 11/02/2019 0344   VLDL 11 11/02/2019 0344   LDLCALC 98 04/29/2022 1023    Physical Exam:    VS:  BP (!) 150/84   Pulse (!) 52   Ht 5' 3.5 (1.613 m)   Wt 187 lb (84.8 kg)   SpO2 97%   BMI 32.61 kg/m     Wt Readings from Last 3  Encounters:  09/11/24 187 lb (84.8 kg)  08/21/24 187 lb 14.4 oz (85.2 kg)  07/31/24 188 lb (85.3 kg)     GEN:  Well nourished, well developed in no acute distress HEENT: Normal NECK: No JVD; No carotid bruits LYMPHATICS: No lymphadenopathy CARDIAC: Irregularly irregular, no murmurs, no rubs, no gallops RESPIRATORY:  Clear to auscultation without rales, wheezing or rhonchi  ABDOMEN: Soft, non-tender, non-distended MUSCULOSKELETAL:  No edema; No deformity  SKIN: Warm and dry LOWER EXTREMITIES: no swelling NEUROLOGIC:  Alert and oriented x 3 PSYCHIATRIC:  Normal affect   ASSESSMENT:    1. CAD S/P percutaneous coronary angioplasty   2. Benign essential HTN   3. Persistent atrial fibrillation (HCC)   4. Elevated liver enzymes   5. Dyspnea on exertion    PLAN:    In order of problems listed above:  Coronary disease stable from that point review without any signs and symptoms that would indicate reactivation of the problem. Benign essential hypertension blood pressure is slightly on the high side we will continue monitoring. Persistent atrial fibrillation, she will be referred to EP for evaluation. Elevation of liver function test hemochromatosis, apparently better right now she need to see hepatologist every 6 months   Medication Adjustments/Labs and Tests Ordered: Current medicines are reviewed at length with  the patient today.  Concerns regarding medicines are outlined above.  No orders of the defined types were placed in this encounter.  Medication changes: No orders of the defined types were placed in this encounter.   Signed, Lamar DOROTHA Fitch, MD, [facility] 09/11/2024 11:01 AM    [facility]

## 2024-09-11 NOTE — Patient Instructions (Signed)
 Medication Instructions:  Your physician recommends that you continue on your current medications as directed. Please refer to the Current Medication list given to you today.  *If you need a refill on your cardiac medications before your next appointment, please call your pharmacy*   Lab Work: None Ordered If you have labs (blood work) drawn today and your tests are completely normal, you will receive your results only by: MyChart Message (if you have MyChart) OR A paper copy in the mail If you have any lab test that is abnormal or we need to change your treatment, we will call you to review the results.   Testing/Procedures: None Ordered   Follow-Up: At [facility], you and your health needs are our priority.  As part of our continuing mission to provide you with exceptional heart care, we have created designated Provider Care Teams.  These Care Teams include your primary Cardiologist (physician) and Advanced Practice Providers (APPs -  Physician Assistants and Nurse Practitioners) who all work together to provide you with the care you need, when you need it.  We recommend signing up for the patient portal called "MyChart".  Sign up information is provided on this After Visit Summary.  MyChart is used to connect with patients for Virtual Visits (Telemedicine).  Patients are able to view lab/test results, encounter notes, upcoming appointments, etc.  Non-urgent messages can be sent to your provider as well.   To learn more about what you can do with MyChart, go to [email or web address].    Your next appointment:   3 month(s)  The format for your next appointment:   In Person  Provider:   Gypsy Balsam, MD    Other Instructions Appt with Dr. Elberta Fortis

## 2024-09-11 NOTE — Addendum Note (Signed)
 Addended by: ARLOA PLANAS D on: 09/11/2024 11:17 AM   Modules accepted: Orders

## 2024-09-11 NOTE — Addendum Note (Signed)
 Addended by: ARLOA PLANAS D on: 09/11/2024 11:07 AM   Modules accepted: Orders

## 2024-10-22 ENCOUNTER — Encounter: Payer: Self-pay | Admitting: Cardiology

## 2024-10-22 ENCOUNTER — Ambulatory Visit: Attending: Cardiology | Admitting: Cardiology

## 2024-10-22 VITALS — BP 141/89 | HR 87 | Ht 63.5 in | Wt 185.0 lb

## 2024-10-22 DIAGNOSIS — I2510 Atherosclerotic heart disease of native coronary artery without angina pectoris: Secondary | ICD-10-CM

## 2024-10-22 DIAGNOSIS — I4819 Other persistent atrial fibrillation: Secondary | ICD-10-CM

## 2024-10-22 DIAGNOSIS — D6869 Other thrombophilia: Secondary | ICD-10-CM | POA: Diagnosis not present

## 2024-10-22 NOTE — Patient Instructions (Signed)
 Medication Instructions:  Your physician recommends that you continue on your current medications as directed. Please refer to the Current Medication list given to you today.  *If you need a refill on your cardiac medications before your next appointment, please call your pharmacy*  Testing/Procedures: Ablation Your physician has recommended that you have an ablation. Catheter ablation is a medical procedure used to treat some cardiac arrhythmias (irregular heartbeats). During catheter ablation, a long, thin, flexible tube is put into a blood vessel in your groin (upper thigh), or neck. This tube is called an ablation catheter. It is then guided to your heart through the blood vessel. Radio frequency waves destroy small areas of heart tissue where abnormal heartbeats may cause an arrhythmia to start.   Tikosyn (Dofetilide) Hospital Admission   Prior to day of admission:  Check with drug insurance company for cost of drug to ensure affordability --- Dofetilide 500 mcg twice a day.  GoodRx is an option if insurance copay is unaffordable.    No Benadryl  is allowed 3 days prior to admission.   Please ensure no missed doses of your anticoagulation (blood thinner) for 3 weeks prior to admission. If a dose is missed please notify our office immediately.   A pharmacist will review all your medications for potential interactions with Tikosyn. If any medication changes are needed prior to admission we will be in touch with you.   If any new medications are started AFTER your admission date is set with [facility]. Please notify our office immediately so your medication list can be updated and reviewed by our pharmacist again.  On day of admission:  Tikosyn initiation requires a 3 night/4 day hospital stay with constant telemetry monitoring. You will have an EKG after each dose of Tikosyn as well as daily lab draws.   If the drug does not convert you to normal rhythm a cardioversion after the 4th dose of  Tikosyn.   [facility] office visit on the morning of admission is needed for preliminary labs/ekg.   Time of admission is dependent on bed availability in the hospital. In some instances, you will be sent home until bed is available. Rarely admission can be delayed to the following day if hospital census prevents available beds.   You may bring personal belongings/clothing with you to the hospital. Please leave your suitcase in the car until you arrive in admissions.   Questions please call our office at [phone]     Follow-Up: At [facility], you and your health needs are our priority.  As part of our continuing mission to provide you with exceptional heart care, our providers are all part of one team.  This team includes your primary Cardiologist (physician) and Advanced Practice Providers or APPs (Physician Assistants and Nurse Practitioners) who all work together to provide you with the care you need, when you need it.  Please call us  if you would like to schedule an ablation or admission for Tikosyn

## 2024-10-22 NOTE — Progress Notes (Signed)
 " Electrophysiology Office Note:   Date:  10/22/2024  ID:  Melissa Clements, DOB 05-Apr-1946, MRN [id number]  Primary Cardiologist: Lamar Fitch, MD Primary Heart Failure: None Electrophysiologist: Latham Kinzler Gladis Norton, MD      History of Present Illness:   Melissa Clements is a 79 y.o. female with h/o coronary artery disease post LAD PCI, CKD stage III, hypertension, hyperlipidemia, persistent atrial fibrillation seen today for  for Electrophysiology evaluation of atrial fibrillation at the request of Robert Krasowski.    She presented to her cardiologist office October 2025 in atrial fibrillation.  Cardiac monitor showed a 100% atrial fibrillation burden.  Discussed the use of AI scribe software for clinical note transcription with the patient, who gave verbal consent to proceed.  History of Present Illness Melissa Clements is a 79 year old female with atrial fibrillation who presents for evaluation of her heart rhythm management.  She is generally unaware of her atrial fibrillation unless she checks her pulse, describing it as feeling like 'an extra pizza in there.' She first became aware of her atrial fibrillation before her heart attack, which led to the placement of a stent. She underwent cardioversion in 2021 but has since returned to atrial fibrillation.  She was unaware that she went back into atrial fibrillation.  She does not have a change in her level of energy or shortness of breath.  She experiences persistent fatigue since her heart attack but denies shortness of breath or any significant impact on her daily activities. She continues to walk her dog twice a day, achieving about 3,000 steps daily without distress or limitations.  Her past medical history includes a heart attack and stent placement. She has a history of bradycardia, with a pulse as low as 52, which lasted for a couple of years. She is currently on Xarelto .  She has a history of elevated liver enzymes, which were  investigated by a gastroenterologist and attributed to high-dose Lipitor. Her current dose of Lipitor is 40 mg, reduced from 80 mg due to previous concerns about liver function.  She experienced a significant hematoma following a radial artery procedure for stent placement, which raised concerns about potential complications with future procedures.    Review of systems complete and found to be negative unless listed in HPI.   EP Information / Studies Reviewed:    EKG is ordered today. Personal review as below.  EKG Interpretation Date/Time:  Monday October 22 2024 14:00:53 EST Ventricular Rate:  76 PR Interval:    QRS Duration:  80 QT Interval:  370 QTC Calculation: 416 R Axis:   -17  Text Interpretation: Atrial fibrillation Septal infarct (cited on or before 31-Jul-2024) Possible Lateral infarct , age undetermined When compared with ECG of 31-Jul-2024 14:15, No significant change since last tracing Confirmed by [facility], Esma Kilts ([id number]) on 10/22/2024 2:07:02 PM     Physical Exam:   VS:  BP (!) 141/89 (BP Location: Right Arm, Patient Position: Sitting, Cuff Size: Normal)   Pulse 87   Ht 5' 3.5 (1.613 m)   Wt 185 lb (83.9 kg)   SpO2 97%   BMI 32.26 kg/m    Wt Readings from Last 3 Encounters:  10/22/24 185 lb (83.9 kg)  09/11/24 187 lb (84.8 kg)  08/21/24 187 lb 14.4 oz (85.2 kg)     GEN: Well nourished, well developed in no acute distress NECK: No JVD; No carotid bruits CARDIAC: Irregularly irregular rate and rhythm, no murmurs, rubs, gallops RESPIRATORY:  Clear to  auscultation without rales, wheezing or rhonchi  ABDOMEN: Soft, non-tender, non-distended EXTREMITIES:  No edema; No deformity   Risk Assessment/Calculations:    CHA2DS2-VASc Score = 5   This indicates a 7.2% annual risk of stroke. The patient's score is based upon: CHF History: 0 HTN History: 1 Diabetes History: 0 Stroke History: 0 Vascular Disease History: 1 Age Score: 2 Gender Score: 1       ASSESSMENT AND PLAN:    1.  Persistent atrial fibrillation: She feels well in atrial fibrillation.  She has no awareness.  She is able to do her daily activities.  She has no chest pain, no shortness of breath or fatigue.  She is able to do her daily activities without restriction.  We discussed rhythm control options including ablation versus dofetilide.  She would like to consider this further.  She Melissa Clements let us  know if she wants to proceed with either of these options.  2.  Secondary hypercoagulable state: On Eliquis  3.  Coronary artery disease: No current chest pain.  Plan per primary cardiology.  Case discussed with referring cardiology  Follow up with EP Team as needed   Signed, Chanae Gemma Gladis Norton, MD  "

## 2024-12-11 ENCOUNTER — Ambulatory Visit: Admitting: Cardiology

## 2025-08-21 ENCOUNTER — Inpatient Hospital Stay

## 2025-08-21 ENCOUNTER — Inpatient Hospital Stay: Admitting: Oncology
